# Patient Record
Sex: Female | Born: 1945 | Race: White | Hispanic: No | Marital: Married | Smoking: Never smoker
Health system: Southern US, Community
[De-identification: ages and names within clinical notes are randomized; demographics above are authoritative.]

## PROBLEM LIST (undated history)

## (undated) DIAGNOSIS — E876 Hypokalemia: Secondary | ICD-10-CM

## (undated) DIAGNOSIS — I639 Cerebral infarction, unspecified: Secondary | ICD-10-CM

## (undated) DIAGNOSIS — Z7901 Long term (current) use of anticoagulants: Secondary | ICD-10-CM

## (undated) DIAGNOSIS — R41840 Attention and concentration deficit: Secondary | ICD-10-CM

## (undated) DIAGNOSIS — I499 Cardiac arrhythmia, unspecified: Secondary | ICD-10-CM

## (undated) DIAGNOSIS — M1990 Unspecified osteoarthritis, unspecified site: Secondary | ICD-10-CM

## (undated) DIAGNOSIS — F32A Depression, unspecified: Secondary | ICD-10-CM

## (undated) DIAGNOSIS — F909 Attention-deficit hyperactivity disorder, unspecified type: Secondary | ICD-10-CM

## (undated) DIAGNOSIS — J189 Pneumonia, unspecified organism: Secondary | ICD-10-CM

## (undated) DIAGNOSIS — E785 Hyperlipidemia, unspecified: Secondary | ICD-10-CM

## (undated) DIAGNOSIS — R002 Palpitations: Secondary | ICD-10-CM

## (undated) DIAGNOSIS — I4819 Other persistent atrial fibrillation: Secondary | ICD-10-CM

## (undated) DIAGNOSIS — F419 Anxiety disorder, unspecified: Secondary | ICD-10-CM

## (undated) DIAGNOSIS — I10 Essential (primary) hypertension: Secondary | ICD-10-CM

## (undated) DIAGNOSIS — G459 Transient cerebral ischemic attack, unspecified: Secondary | ICD-10-CM

## (undated) DIAGNOSIS — F329 Major depressive disorder, single episode, unspecified: Secondary | ICD-10-CM

## (undated) HISTORY — DX: Palpitations: R00.2

## (undated) HISTORY — DX: Morbid (severe) obesity due to excess calories: E66.01

## (undated) HISTORY — DX: Attention and concentration deficit: R41.840

## (undated) HISTORY — DX: Depression, unspecified: F32.A

## (undated) HISTORY — DX: Hyperlipidemia, unspecified: E78.5

## (undated) HISTORY — DX: Major depressive disorder, single episode, unspecified: F32.9

## (undated) HISTORY — DX: Long term (current) use of anticoagulants: Z79.01

## (undated) HISTORY — PX: TONSILLECTOMY: SHX5217

## (undated) HISTORY — PX: BREAST BIOPSY: SHX20

## (undated) HISTORY — PX: TONSILLECTOMY: SUR1361

## (undated) HISTORY — DX: Other persistent atrial fibrillation: I48.19

## (undated) HISTORY — DX: Hypokalemia: E87.6

## (undated) HISTORY — DX: Essential (primary) hypertension: I10

---

## 2003-02-22 ENCOUNTER — Inpatient Hospital Stay (HOSPITAL_COMMUNITY): Admission: EM | Admit: 2003-02-22 | Discharge: 2003-02-24 | Payer: Self-pay | Admitting: Emergency Medicine

## 2003-02-23 ENCOUNTER — Encounter: Payer: Self-pay | Admitting: Cardiology

## 2004-12-05 ENCOUNTER — Encounter: Admission: RE | Admit: 2004-12-05 | Discharge: 2004-12-05 | Payer: Self-pay | Admitting: Cardiology

## 2004-12-22 ENCOUNTER — Ambulatory Visit (HOSPITAL_COMMUNITY): Admission: RE | Admit: 2004-12-22 | Discharge: 2004-12-22 | Payer: Self-pay | Admitting: Cardiology

## 2006-01-15 ENCOUNTER — Encounter: Admission: RE | Admit: 2006-01-15 | Discharge: 2006-04-15 | Payer: Self-pay | Admitting: Cardiology

## 2009-04-13 ENCOUNTER — Inpatient Hospital Stay (HOSPITAL_COMMUNITY): Admission: EM | Admit: 2009-04-13 | Discharge: 2009-04-17 | Payer: Self-pay

## 2009-04-13 ENCOUNTER — Ambulatory Visit: Payer: Self-pay | Admitting: Internal Medicine

## 2009-04-13 ENCOUNTER — Ambulatory Visit: Payer: Self-pay | Admitting: Diagnostic Radiology

## 2009-11-16 ENCOUNTER — Ambulatory Visit: Payer: Self-pay | Admitting: Cardiology

## 2009-12-20 ENCOUNTER — Ambulatory Visit: Payer: Self-pay | Admitting: Cardiology

## 2010-01-17 ENCOUNTER — Ambulatory Visit: Payer: Self-pay | Admitting: Cardiology

## 2010-02-16 ENCOUNTER — Ambulatory Visit: Payer: Self-pay | Admitting: Cardiology

## 2010-05-19 ENCOUNTER — Ambulatory Visit (INDEPENDENT_AMBULATORY_CARE_PROVIDER_SITE_OTHER): Payer: 59 | Admitting: Cardiology

## 2010-05-19 DIAGNOSIS — I4891 Unspecified atrial fibrillation: Secondary | ICD-10-CM

## 2010-05-19 DIAGNOSIS — Z7901 Long term (current) use of anticoagulants: Secondary | ICD-10-CM

## 2010-05-19 DIAGNOSIS — Z79899 Other long term (current) drug therapy: Secondary | ICD-10-CM

## 2010-06-16 ENCOUNTER — Other Ambulatory Visit (INDEPENDENT_AMBULATORY_CARE_PROVIDER_SITE_OTHER): Payer: 59

## 2010-06-16 DIAGNOSIS — I4891 Unspecified atrial fibrillation: Secondary | ICD-10-CM

## 2010-06-16 DIAGNOSIS — Z7901 Long term (current) use of anticoagulants: Secondary | ICD-10-CM

## 2010-06-18 LAB — POCT I-STAT 3, ART BLOOD GAS (G3+)
Acid-Base Excess: 2 mmol/L (ref 0.0–2.0)
Bicarbonate: 25.7 mEq/L — ABNORMAL HIGH (ref 20.0–24.0)
O2 Saturation: 100 %
Patient temperature: 99.8
TCO2: 27 mmol/L (ref 0–100)
pCO2 arterial: 38.3 mmHg (ref 35.0–45.0)
pH, Arterial: 7.437 — ABNORMAL HIGH (ref 7.350–7.400)
pO2, Arterial: 170 mmHg — ABNORMAL HIGH (ref 80.0–100.0)

## 2010-06-18 LAB — BASIC METABOLIC PANEL
BUN: 11 mg/dL (ref 6–23)
BUN: 11 mg/dL (ref 6–23)
BUN: 13 mg/dL (ref 6–23)
BUN: 17 mg/dL (ref 6–23)
CO2: 24 mEq/L (ref 19–32)
CO2: 25 mEq/L (ref 19–32)
CO2: 28 mEq/L (ref 19–32)
CO2: 28 mEq/L (ref 19–32)
Calcium: 8.4 mg/dL (ref 8.4–10.5)
Calcium: 8.5 mg/dL (ref 8.4–10.5)
Calcium: 8.5 mg/dL (ref 8.4–10.5)
Calcium: 9.1 mg/dL (ref 8.4–10.5)
Chloride: 107 mEq/L (ref 96–112)
Chloride: 107 mEq/L (ref 96–112)
Chloride: 110 mEq/L (ref 96–112)
Chloride: 104 mEq/L (ref 96–112)
Chloride: 107 mEq/L (ref 96–112)
Creatinine, Ser: 0.93 mg/dL (ref 0.4–1.2)
Creatinine, Ser: 1.02 mg/dL (ref 0.4–1.2)
Creatinine, Ser: 1.09 mg/dL (ref 0.4–1.2)
Creatinine, Ser: 0.9 mg/dL (ref 0.4–1.2)
GFR calc Af Amer: >60 mL/min (ref >60)
GFR calc Af Amer: >60 mL/min (ref >60)
GFR calc Af Amer: >60 mL/min (ref >60)
GFR calc Af Amer: >60 mL/min (ref >60)
GFR calc Af Amer: >60 mL/min (ref >60)
GFR calc non Af Amer: 51 mL/min — ABNORMAL LOW (ref >60)
GFR calc non Af Amer: 55 mL/min — ABNORMAL LOW (ref >60)
GFR calc non Af Amer: >60 mL/min (ref >60)
GFR calc non Af Amer: >60 mL/min (ref >60)
GFR calc non Af Amer: 58 mL/min — ABNORMAL LOW (ref >60)
Glucose, Bld: 117 mg/dL — ABNORMAL HIGH (ref 70–99)
Glucose, Bld: 118 mg/dL — ABNORMAL HIGH (ref 70–99)
Glucose, Bld: 150 mg/dL — ABNORMAL HIGH (ref 70–99)
Glucose, Bld: 116 mg/dL — ABNORMAL HIGH (ref 70–99)
Potassium: 3.2 mEq/L — ABNORMAL LOW (ref 3.5–5.1)
Potassium: 3.4 mEq/L — ABNORMAL LOW (ref 3.5–5.1)
Potassium: 4.5 mEq/L (ref 3.5–5.1)
Potassium: 3.9 mEq/L (ref 3.5–5.1)
Potassium: 3.4 mEq/L — ABNORMAL LOW (ref 3.5–5.1)
Sodium: 141 mEq/L (ref 135–145)
Sodium: 142 mEq/L (ref 135–145)
Sodium: 143 mEq/L (ref 135–145)
Sodium: 143 mEq/L (ref 135–145)

## 2010-06-18 LAB — GLUCOSE, CAPILLARY
Glucose-Capillary: 78 mg/dL (ref 70–99)
Glucose-Capillary: 84 mg/dL (ref 70–99)

## 2010-06-18 LAB — CBC
HCT: 36.4 % (ref 36.0–46.0)
HCT: 37 % (ref 36.0–46.0)
HCT: 38.4 % (ref 36.0–46.0)
HCT: 45.5 % (ref 36.0–46.0)
Hemoglobin: 12.5 g/dL (ref 12.0–15.0)
Hemoglobin: 12.8 g/dL (ref 12.0–15.0)
Hemoglobin: 13 g/dL (ref 12.0–15.0)
Hemoglobin: 15.4 g/dL — ABNORMAL HIGH (ref 12.0–15.0)
MCHC: 33.9 g/dL (ref 30.0–36.0)
MCHC: 34.4 g/dL (ref 30.0–36.0)
MCHC: 34.5 g/dL (ref 30.0–36.0)
MCHC: 33.9 g/dL (ref 30.0–36.0)
MCV: 90.3 fL (ref 78.0–100.0)
MCV: 90.3 fL (ref 78.0–100.0)
MCV: 90.5 fL (ref 78.0–100.0)
MCV: 89.5 fL (ref 78.0–100.0)
Platelets: 256 10*3/uL (ref 150–400)
Platelets: 272 10*3/uL (ref 150–400)
Platelets: 281 10*3/uL (ref 150–400)
Platelets: 279 10*3/uL (ref 150–400)
RBC: 4.03 MIL/uL (ref 3.87–5.11)
RBC: 4.09 MIL/uL (ref 3.87–5.11)
RBC: 4.25 MIL/uL (ref 3.87–5.11)
RBC: 5.09 MIL/uL (ref 3.87–5.11)
RDW: 13.1 % (ref 11.5–15.5)
RDW: 13.2 % (ref 11.5–15.5)
RDW: 13.3 % (ref 11.5–15.5)
RDW: 12.4 % (ref 11.5–15.5)
WBC: 12.1 10*3/uL — ABNORMAL HIGH (ref 4.0–10.5)
WBC: 9.1 10*3/uL (ref 4.0–10.5)
WBC: 9.8 10*3/uL (ref 4.0–10.5)
WBC: 17.1 10*3/uL — ABNORMAL HIGH (ref 4.0–10.5)

## 2010-06-18 LAB — PREPARE FRESH FROZEN PLASMA

## 2010-06-18 LAB — CULTURE, BLOOD (ROUTINE X 2)
Culture: NO GROWTH
Culture: NO GROWTH

## 2010-06-18 LAB — CROSSMATCH
ABO/RH(D): O POS
Antibody Screen: NEGATIVE

## 2010-06-18 LAB — PROTIME-INR
INR: 1.96 — ABNORMAL HIGH (ref 0.00–1.49)
INR: 2.3 — ABNORMAL HIGH (ref 0.00–1.49)
INR: 2.33 — ABNORMAL HIGH (ref 0.00–1.49)
INR: 2.33 — ABNORMAL HIGH (ref 0.00–1.49)
Prothrombin Time: 22.2 seconds — ABNORMAL HIGH (ref 11.6–15.2)
Prothrombin Time: 25.1 seconds — ABNORMAL HIGH (ref 11.6–15.2)
Prothrombin Time: 25.4 seconds — ABNORMAL HIGH (ref 11.6–15.2)
Prothrombin Time: 25.4 seconds — ABNORMAL HIGH (ref 11.6–15.2)

## 2010-06-18 LAB — ABO/RH: ABO/RH(D): O POS

## 2010-06-18 LAB — CULTURE, ROUTINE-ABSCESS: Culture: NORMAL

## 2010-06-18 LAB — MAGNESIUM: Magnesium: 2.5 mg/dL (ref 1.5–2.5)

## 2010-06-18 LAB — ANAEROBIC CULTURE

## 2010-06-18 LAB — MRSA PCR SCREENING: MRSA by PCR: NEGATIVE

## 2010-07-21 ENCOUNTER — Other Ambulatory Visit (INDEPENDENT_AMBULATORY_CARE_PROVIDER_SITE_OTHER): Payer: 59 | Admitting: *Deleted

## 2010-07-21 ENCOUNTER — Other Ambulatory Visit: Payer: 59 | Admitting: *Deleted

## 2010-07-21 ENCOUNTER — Ambulatory Visit (INDEPENDENT_AMBULATORY_CARE_PROVIDER_SITE_OTHER): Payer: 59 | Admitting: Cardiology

## 2010-07-21 DIAGNOSIS — Z7901 Long term (current) use of anticoagulants: Secondary | ICD-10-CM

## 2010-07-21 DIAGNOSIS — I4891 Unspecified atrial fibrillation: Secondary | ICD-10-CM | POA: Insufficient documentation

## 2010-08-16 ENCOUNTER — Encounter: Payer: Self-pay | Admitting: *Deleted

## 2010-08-16 DIAGNOSIS — R002 Palpitations: Secondary | ICD-10-CM

## 2010-08-16 DIAGNOSIS — E785 Hyperlipidemia, unspecified: Secondary | ICD-10-CM

## 2010-08-16 DIAGNOSIS — I10 Essential (primary) hypertension: Secondary | ICD-10-CM

## 2010-08-16 DIAGNOSIS — I480 Paroxysmal atrial fibrillation: Secondary | ICD-10-CM

## 2010-08-17 ENCOUNTER — Encounter: Payer: 59 | Admitting: *Deleted

## 2010-08-17 ENCOUNTER — Telehealth: Payer: Self-pay | Admitting: Cardiology

## 2010-08-17 ENCOUNTER — Ambulatory Visit: Payer: 59 | Admitting: Cardiology

## 2010-08-17 NOTE — Telephone Encounter (Signed)
Left message ok to wait, but very important she have protime next week.  Last check on 4/20 and rescheduled for 5/21

## 2010-08-17 NOTE — Telephone Encounter (Signed)
I rescheduled Rose Ball's INR from 12:15pm 08/17/10 to 1:45pm on 08/21/10 today. Please call back.

## 2010-08-18 NOTE — Discharge Summary (Signed)
NAME:  Rose Ball, Rose Ball                     ACCOUNT NO.:  [id number]   MEDICAL RECORD NO.:  [id number]                   PATIENT TYPE:  INP   LOCATION:  2038                                 FACILITY:  [facility]   PHYSICIAN:  Rose Ball, M.D.            DATE OF BIRTH:  04-29-1945   DATE OF ADMISSION:  02/22/2003  DATE OF DISCHARGE:  02/24/2003                                 DISCHARGE SUMMARY   PRIMARY DISCHARGE DIAGNOSIS:  Onset of atrial fibrillation, now resolved.   SECONDARY DISCHARGE DIAGNOSES:  1. Morbid obesity.  2. Hyperlipidemia.   HISTORY OF PRESENT ILLNESS:  The patient is a morbidly obese 65 year old  female.  She presents to the emergency room department with atrial  fibrillation.  She has not seen a physician in quite some time.  Over the  last four to five days, she had a head cold and was using some over-the-  counter NyQuil.  On the day of admission, she began having some right-sided  chest discomfort as well as noticeable feeling of irregularity.  She  presented to the emergency room where she was noted to be going back and  forth into atrial fibrillation.  She initially had an uncontrolled  ventricular response.  She was subsequently seen and evaluated by Dr.  Viann Ball and was admitted for further evaluation.   Please see the dictated History and Physical for further patient  presentation and profile.   ADMISSION LABORATORY DATA:  1. EKG showed atrial fibrillation with an uncontrolled response.  There were     very minimal nonspecific ST changes.  There were no acute changes.  2. CBC was normal.  Chemistry panel was  within normal limits except for a     glucose of 114, alkaline phosphatase 127.  PT and PTT were unremarkable.  3. Lipid profile showed total cholesterol 238, triglycerides 153, HDL 42,     and LDL 165.  4. TSH was normal.  5. Cardiac enzymes were all negative.  6. Chest x-ray report is pending at time of dictation.   HOSPITAL  COURSE:  The patient was admitted to telemetry.  Serial cardiac  enzymes were drawn.  She was placed on Lovenox, beta blockers, as well as  initial IV Cardizem for rate control.  A 2-D echocardiogram was performed.  It was a very technically difficult study.  Overall LV function was felt to  be normal.  There was some mild LV thickness.  She subsequently converted  back to normal sinus rhythm.  Today, on February 24, 2003, she is doing well  without complaints.  She has had no further chest pain.  She did have a  transient elevation in her white count which is now back down to normal, and  she is felt to be a stable candidate for discharge today.   CONDITION ON DISCHARGE:  Stable.   DISCHARGE MEDICATIONS:  1. Toprol XL 200 mg a day.  2. Aspirin  daily.  3. She may resume  Celexa as she was taking before.  4. She is to stop Adderall.   ACTIVITY:  As tolerated.   DIET:  She is to limit her caffeine intake.   SPECIAL INSTRUCTIONS:  She is advised to not use over-the-counter cold  medicines.   FOLLOW UP:  Will have her follow up with Rose Ball in the office in  approximately two weeks, sooner if problems arise.      Rose Ball, N.P.                 Rose Ball, M.D.    LCO/MEDQ  D:  02/24/2003  T:  02/24/2003  Job:  [id number]   cc:   Rose Ball, M.D.  [address], [address]  [place]  [place] [address]  Fax: [phone]

## 2010-08-18 NOTE — H&P (Signed)
NAME:  Rose Ball, Rose Ball                     ACCOUNT NO.:  [id number]   MEDICAL RECORD NO.:  [id number]                   PATIENT TYPE:  EMS   LOCATION:  MINO                                 FACILITY:  [facility]   PHYSICIAN:  W. Ashley Royalty., M.D.         DATE OF BIRTH:  1945-07-14   DATE OF ADMISSION:  02/22/2003  DATE OF DISCHARGE:                                HISTORY & PHYSICAL   HISTORY:  This 65 year old female was admitted to rule out a myocardial  infarction with atrial fibrillation. She has a history of cardiac arrhythmia  and was evidently evaluated by Dr. Patty Sermons with an echocardiogram several  years ago. She has no regular medical physician and has not seen a physician  in some time. She has had an upper respiratory infection and head cold for  the past five days and has been taking NyQuil and is slowly improving. She  had the onset of right-sided chest discomfort this morning that was  described as a vague ache, which would be worse if she bent over and caused  tightness in her shoulder. She had some irregularities in her chest also and  presented to the emergency room when the chest discomfort persisted after  lasting all day and she was found to be in atrial fibrillation. She was  initially in sinus, but then has gone back and forth into atrial  fibrillation. She is currently in atrial fibrillation with an uncontrolled  response. She did not have chest discomfort prior to today. She denies any  PND or orthopnea. She has been quite obese for many years and is normally  inactive. She has not had significant edema. She has not had syncope and  does not have exertional pain as noted above.   PAST MEDICAL HISTORY:  Negative for hypertension or diabetes. Her  cholesterol status is unknown. There is no history of ulcers of GI bleeding.   PAST SURGICAL HISTORY:  Breast biopsy.   ALLERGIES:  No known drug allergies.   CURRENT MEDICATIONS:  1. Celexa 30 mg daily.  2.  Adderall for depression.   FAMILY HISTORY:  Father is living age 64, alive and well. Mother is living  age 64 with a history of uterine cancer and diabetes. Two brothers are  living and well.   SOCIAL HISTORY:  She is married. She has two children ages 64 and 64. Works  as a Theatre manager for [employer]. Her husband works as a Medical illustrator  for a [employer]. She is a nonsmoker. She does not use alcohol  to excess. She does drink excess caffeine in the form of approximately three  colas per day.   REVIEW OF SYSTEMS:  She has been obese weighing in the neighborhood of 275-  280 pounds for years. She has no diplopia, glaucoma, cataracts, or blurred  vision. She has complained of a ringing in her ears some. She has no  dysphagia, hiatal hernia, diarrhea, or  constipation. She has no  genitourinary symptoms. She has not had any significant GI bleeding.  Significant for arthritis. No prior history of migraines or TIAs. She does  have significant depression for many years that she is seeing Dr. Andee Poles for. She has never been hospitalized for this. Other than as noted  above the remainder of the review of systems is unremarkable.   PHYSICAL EXAMINATION:  GENERAL:  She is a markedly obese female who is  pleasant and currently in no acute distress.  VITAL SIGNS:  Blood pressure 150/80, pulse 150 and irregular.  SKIN:  Warm and dry and is not diaphoretic.  HEENT:  EOMI. PERRL. CNS clear. Fundi not examined. Pharynx negative.  NECK:  Supple without masses. No JVD, thyromegaly, or bruits.  LUNGS:  Clear to auscultation and percussion.  CARDIAC:  Normal S1 and S2. Regular rhythm. No S3, no murmur.  ABDOMEN:  Soft, nontender. No hepatosplenomegaly. No definite masses felt,  although very obese and difficult to feel.  EXTREMITIES:  Lower pulses are difficult to feel, but peripheral pulses are  2+. There is no significant edema noted. No varicosities or Homan's sign is   noted.  NEUROLOGIC:  Normal.   LABORATORY DATA:  EKG shows atrial fibrillation with an uncontrolled  response. There is very minimal nonspecific ST changes, but no acute  abnormality is noted. The initial CBC is normal. Chemistry panel, PT/PTT are  pending at the time of dictation.   IMPRESSION:  1. Atrial fibrillation, new onset.  2. Chest pain with atypical features, possibly due to atrial fibrillation,     rule out myocardial infarction.  3. Morbid obesity.  4. History of depression.   RECOMMENDATIONS:  1. Admit to telemetry bed.  2. Check serial enzymes.  3. Begin Lovenox, beta blockers, intravenous Cardizem for rate control.  4. Check echocardiogram and TSH.  5. Further workup and treatment per Dr. Patty Sermons or his associates.                                                Darden Palmer., M.D.    WST/MEDQ  D:  02/22/2003  T:  02/22/2003  Job:  [id number]   cc:   Cassell Clement, M.D.  [address]  [place]  [place] [address]  Fax: [phone]

## 2010-08-21 ENCOUNTER — Ambulatory Visit (INDEPENDENT_AMBULATORY_CARE_PROVIDER_SITE_OTHER): Payer: 59 | Admitting: *Deleted

## 2010-08-21 DIAGNOSIS — Z7901 Long term (current) use of anticoagulants: Secondary | ICD-10-CM

## 2010-08-21 DIAGNOSIS — I4891 Unspecified atrial fibrillation: Secondary | ICD-10-CM

## 2010-08-21 LAB — POCT INR: INR: 2.6

## 2010-08-30 ENCOUNTER — Encounter: Payer: Self-pay | Admitting: Cardiology

## 2010-08-30 ENCOUNTER — Ambulatory Visit (INDEPENDENT_AMBULATORY_CARE_PROVIDER_SITE_OTHER): Payer: 59 | Admitting: Cardiology

## 2010-08-30 VITALS — BP 120/80 | HR 84 | Wt 285.0 lb

## 2010-08-30 DIAGNOSIS — I4891 Unspecified atrial fibrillation: Secondary | ICD-10-CM

## 2010-08-30 DIAGNOSIS — F909 Attention-deficit hyperactivity disorder, unspecified type: Secondary | ICD-10-CM | POA: Insufficient documentation

## 2010-08-30 DIAGNOSIS — I10 Essential (primary) hypertension: Secondary | ICD-10-CM

## 2010-08-30 NOTE — Progress Notes (Signed)
Caryn Bee Date of Birth:  05/20/1945 [facility] / [facility] [address]   [address] [place], [place]  [address] [phone]           Fax   [phone]  HPI: This pleasant 65 year old woman has a history of paroxysmal atrial fibrillation.  Recently she has been in atrial fibrillation more often than in sinus rhythm clinically she has not had any TIA symptoms.  She has been compliant with her Coumadin.  She's not having any complications from the Coumadin.  She denies chest pain or dizzy spells.  She does have some exertional dyspnea related to her excess weight.  She does not have known coronary disease and she had a normal nuclear stress test 11/27/04.  Her last echocardiogram was 12/13/04 and showed normal left ventricular systolic function and mild restrictive diastolic function, mild left atrial enlargement, and trace Mitral regurgitation.  Current Outpatient Prescriptions  Medication Sig Dispense Refill  . atorvastatin (LIPITOR) 10 MG tablet Take 1 tablet by mouth Daily.        Marland Kitchen escitalopram (LEXAPRO) 20 MG tablet Take 1 tablet by mouth Daily. Lexapro      . metoprolol (TOPROL-XL) 100 MG 24 hr tablet Take 1 tablet by mouth Daily.      . nitroGLYCERIN (NITROSTAT) 0.4 MG SL tablet Place 0.4 mg under the tongue every 5 (five) minutes as needed.        Marland Kitchen RYTHMOL SR 325 MG 12 hr capsule Take 1 capsule by mouth Twice daily.      Marland Kitchen VYVANSE 70 MG capsule Take 1 capsule by mouth Daily.      Marland Kitchen warfarin (COUMADIN) 5 MG tablet Take 5 mg by mouth daily.        Marland Kitchen DISCONTD: amphetamine-dextroamphetamine (ADDERALL) 5 MG tablet Take 1 tablet by mouth Daily.      Marland Kitchen DISCONTD: rOPINIRole (REQUIP) 1 MG tablet Take 1 tablet by mouth Daily.        Allergies  Allergen Reactions  . Amiodarone     dyspnea    Patient Active Problem List  Diagnoses  . Atrial fibrillation  . Encounter for long-term (current) use of anticoagulants  . Hypertension  . Paroxysmal atrial  fibrillation  . Dyslipidemia  . Essential hypertension  . Palpitations  . Attention deficit disorder with hyperactivity syndrome    History  Smoking status  . Never Smoker   Smokeless tobacco  . Not on file    History  Alcohol Use: Not on file    Family History  Problem Relation Age of Onset  . Coronary artery disease Father     Review of Systems: The patient denies any heat or cold intolerance.  No weight gain or weight loss.  The patient denies headaches or blurry vision.  There is no cough or sputum production.  The patient denies dizziness.  There is no hematuria or hematochezia.  The patient denies any muscle aches or arthritis.  The patient denies any rash.  The patient denies frequent falling or instability.  There is no history of depression or anxiety.  All other systems were reviewed and are negative.   Physical Exam: Filed Vitals:   08/30/10 1421  BP: 120/80  Pulse: 84  The general appearance reveals a large woman in no acute distress.Pupils equal and reactive.   Extraocular Movements are full.  There is no scleral icterus.  The mouth and pharynx are normal.  The neck is supple.  The carotids reveal no bruits.  The jugular venous pressure is normal.  The thyroid is not enlarged.  There is no lymphadenopathy.The chest is clear to percussion and auscultation. There are no rales or rhonchi. Expansion of the chest is symmetrical.The precordium is quiet.  The first heart sound is normal.  The second heart sound is physiologically split.  There is no murmur gallop rub or click.  There is no abnormal lift or heave.  The rhythm is irregular.The abdomen is soft and nontender. Bowel sounds are normal. The liver and spleen are not enlarged. There Are no abdominal masses. There are no bruits.  Normal extremity without phlebitis or edema.  Pedal pulses are present.Strength is normal and symmetrical in all extremities.  There is no lateralizing weakness.  There are no sensory  deficits.  EKG today shows atrial fibrillation with a controlled ventricular response.  She has poor R-wave progression V1 through V3.    Assessment / Plan: Continue present medication.  Recheck at monthly intervals for protime and we will see her in 3 months.  Continue to try to lose weight and increase exercise.

## 2010-08-30 NOTE — Assessment & Plan Note (Signed)
The patient has a history of paroxysmal atrial fibrillation.  Frequently she is not aware when she goes from normal sinus rhythm to atrial fibrillation.  She remains on long-term Coumadin.  She has exertional dyspnea related to exogenous obesity.  She does not get much intentional aerobic exercise.  She's not been expressing any chest pain or shortness of breath.  Also since last visit her weight is down 4 pounds.She has not had any TIA symptoms.  She's not having any dizziness or syncope.

## 2010-08-30 NOTE — Assessment & Plan Note (Signed)
The patient has a history of essential hypertension.  She's not having any headaches or dizzy spells.  She remains on beta blocker which she is tolerating well.

## 2010-09-11 ENCOUNTER — Ambulatory Visit (INDEPENDENT_AMBULATORY_CARE_PROVIDER_SITE_OTHER): Payer: 59 | Admitting: *Deleted

## 2010-09-11 DIAGNOSIS — I4891 Unspecified atrial fibrillation: Secondary | ICD-10-CM

## 2010-09-11 LAB — POCT INR: INR: 2.3

## 2010-10-09 ENCOUNTER — Ambulatory Visit (INDEPENDENT_AMBULATORY_CARE_PROVIDER_SITE_OTHER): Payer: 59 | Admitting: *Deleted

## 2010-10-09 DIAGNOSIS — I4891 Unspecified atrial fibrillation: Secondary | ICD-10-CM

## 2010-10-09 DIAGNOSIS — Z7901 Long term (current) use of anticoagulants: Secondary | ICD-10-CM

## 2010-11-01 HISTORY — PX: US ECHOCARDIOGRAPHY: HXRAD669

## 2010-11-06 ENCOUNTER — Encounter: Payer: 59 | Admitting: *Deleted

## 2010-11-06 ENCOUNTER — Telehealth: Payer: Self-pay | Admitting: Cardiology

## 2010-11-06 DIAGNOSIS — I4891 Unspecified atrial fibrillation: Secondary | ICD-10-CM

## 2010-11-06 MED ORDER — WARFARIN SODIUM 5 MG PO TABS: ORAL_TABLET | ORAL | Status: DC

## 2010-11-06 NOTE — Telephone Encounter (Signed)
Patient called in wanting to reschedule her missed INR at [facility] today and, when asked for her date of birth, she only provided the month and date. When asked to elaborate she claimed that we had her in the system. When told that more information was needed to accurately find her in our database and that I could not find her on our schedule for today, she said never mind and rudely hung up.

## 2010-11-06 NOTE — Telephone Encounter (Signed)
Called patient back and left message to call and reschedule INR

## 2010-11-09 ENCOUNTER — Ambulatory Visit (INDEPENDENT_AMBULATORY_CARE_PROVIDER_SITE_OTHER): Payer: 59 | Admitting: *Deleted

## 2010-11-09 DIAGNOSIS — I4891 Unspecified atrial fibrillation: Secondary | ICD-10-CM

## 2010-11-09 DIAGNOSIS — Z7901 Long term (current) use of anticoagulants: Secondary | ICD-10-CM

## 2010-11-13 ENCOUNTER — Encounter: Payer: Self-pay | Admitting: Cardiology

## 2010-11-15 ENCOUNTER — Encounter: Payer: Self-pay | Admitting: Nurse Practitioner

## 2010-11-15 ENCOUNTER — Ambulatory Visit (INDEPENDENT_AMBULATORY_CARE_PROVIDER_SITE_OTHER): Payer: 59 | Admitting: Nurse Practitioner

## 2010-11-15 VITALS — BP 158/108 | HR 86 | Ht 69.0 in | Wt 278.2 lb

## 2010-11-15 DIAGNOSIS — I4891 Unspecified atrial fibrillation: Secondary | ICD-10-CM

## 2010-11-15 DIAGNOSIS — I10 Essential (primary) hypertension: Secondary | ICD-10-CM

## 2010-11-15 MED ORDER — PROPAFENONE HCL ER 425 MG PO CP12: 425.0000 mg | ORAL_CAPSULE | Freq: Two times a day (BID) | ORAL | Status: DC

## 2010-11-15 NOTE — Progress Notes (Signed)
    Rose Ball Date of Birth: 1945-06-21   History of Present Illness: Rose Ball is seen today for a work in visit. She is seen for Dr. Patty Sermons. She is here because of atrial fib. She has been out of rhythm for almost 2 months. She feels tired and is more short of breath. She thought she should be seen. No chest pain. She remains on her coumadin but last check was a little low due to a missed dose. She has had remote echo in 2006. No recent labs. She has never had cardioversion.  Current Outpatient Prescriptions on File Prior to Visit  Medication Sig Dispense Refill  . atorvastatin (LIPITOR) 10 MG tablet Take 1 tablet by mouth Daily.        . Cholecalciferol (VITAMIN D PO) Take 5,000 Units by mouth daily.        Marland Kitchen escitalopram (LEXAPRO) 20 MG tablet Take 1 tablet by mouth Daily. Lexapro      . metoprolol (TOPROL-XL) 100 MG 24 hr tablet Take 1 tablet by mouth Daily.      . nitroGLYCERIN (NITROSTAT) 0.4 MG SL tablet Place 0.4 mg under the tongue every 5 (five) minutes as needed.        Marland Kitchen VYVANSE 70 MG capsule Take 1 capsule by mouth Daily.      Marland Kitchen warfarin (COUMADIN) 5 MG tablet 1/2 tablet Mon, Wed, and Fri and 1 tablet other days or as directed  30 tablet  11  . DISCONTD: RYTHMOL SR 325 MG 12 hr capsule Take 1 capsule by mouth Twice daily.        Allergies  Allergen Reactions  . Amiodarone     dyspnea    Past Medical History  Diagnosis Date  . Hypertension   . Paroxysmal atrial fibrillation   . Morbid obesity   . Attention deficit     on Vyvanse  . Dyslipidemia   . Essential hypertension   . Palpitations   . Hypokalemia   . Chronic anticoagulation   . Depression   . High risk medication use     Past Surgical History  Procedure Date  . Breast biopsy   . Tonsillectomy     History  Smoking status  . Never Smoker   Smokeless tobacco  . Not on file    History  Alcohol Use No    Family History  Problem Relation Age of Onset  . Coronary artery disease  Father     Review of Systems: The review of systems is positive for fatigue and shortness of breath. She is not dizzy. No syncope. No edema.  She does remain on her stimulant therapy due to her ADD. All other systems were reviewed and are negative.  Physical Exam: BP 158/108  Pulse 86  Ht 5\' 9"  (1.753 m)  Wt 278 lb 3.2 oz (126.191 kg)  BMI 41.08 kg/m2 Patient is very pleasant and in no acute distress. She is morbidly obese. Skin is warm and dry. Color is normal.  HEENT is unremarkable. Normocephalic/atraumatic. PERRL. Sclera are nonicteric. Neck is supple. No masses. No JVD. Lungs are clear. Cardiac exam shows an irregular rhythm. Her rate is controlled.  Abdomen is obese and soft. Extremities are without edema. Gait and ROM are intact. No gross neurologic deficits noted.  LABORATORY DATA: EKG shows atrial fib. Her rate is controlled.   Assessment / Plan:

## 2010-11-15 NOTE — Assessment & Plan Note (Signed)
She has been out of rhythm now for almost 2 months. She does not feel good with it. We will update her echo and check labs today. I have increased her Rhythmol to 425 mg BID. She has an appointment to see Dr. Patty Sermons back later this month. If she does not convert we would need to consider cardioversion. She is not interested in this being a permanent rhythm for her. Patient is agreeable to this plan and will call if any problems develop in the interim.

## 2010-11-15 NOTE — Patient Instructions (Signed)
We are going to increase your Rythmol to 425mg  twice a day I have sent the prescription to the drug store. We are going to check your labs today We are going to update your ultrasound of your heart Keep your appointment with Dr. Patty Sermons for later this month. We may need to consider a cardioversion if you have not gone back in sinus rhythm at that visit. Call for any problems.

## 2010-11-15 NOTE — Assessment & Plan Note (Signed)
Blood pressure was up today and will need to be readdressed on return visit.

## 2010-11-16 ENCOUNTER — Ambulatory Visit (HOSPITAL_COMMUNITY): Payer: 59 | Attending: Nurse Practitioner | Admitting: Radiology

## 2010-11-16 ENCOUNTER — Other Ambulatory Visit (HOSPITAL_COMMUNITY): Payer: Self-pay | Admitting: Cardiology

## 2010-11-16 ENCOUNTER — Encounter: Payer: Self-pay | Admitting: Nurse Practitioner

## 2010-11-16 DIAGNOSIS — I4891 Unspecified atrial fibrillation: Secondary | ICD-10-CM

## 2010-11-16 DIAGNOSIS — R5381 Other malaise: Secondary | ICD-10-CM | POA: Insufficient documentation

## 2010-11-16 DIAGNOSIS — E785 Hyperlipidemia, unspecified: Secondary | ICD-10-CM | POA: Insufficient documentation

## 2010-11-16 DIAGNOSIS — R0609 Other forms of dyspnea: Secondary | ICD-10-CM | POA: Insufficient documentation

## 2010-11-16 DIAGNOSIS — I10 Essential (primary) hypertension: Secondary | ICD-10-CM | POA: Insufficient documentation

## 2010-11-16 DIAGNOSIS — R0989 Other specified symptoms and signs involving the circulatory and respiratory systems: Secondary | ICD-10-CM | POA: Insufficient documentation

## 2010-11-16 DIAGNOSIS — R5383 Other fatigue: Secondary | ICD-10-CM | POA: Insufficient documentation

## 2010-11-16 LAB — BASIC METABOLIC PANEL
BUN: 26 mg/dL — ABNORMAL HIGH (ref 6–23)
CO2: 27 mEq/L (ref 19–32)
Calcium: 9 mg/dL (ref 8.4–10.5)
Chloride: 104 mEq/L (ref 96–112)
Creatinine, Ser: 1.2 mg/dL (ref 0.4–1.2)
GFR: 50.01 mL/min — ABNORMAL LOW (ref >60.00)
Glucose, Bld: 96 mg/dL (ref 70–99)
Potassium: 5.3 mEq/L — ABNORMAL HIGH (ref 3.5–5.1)
Sodium: 139 mEq/L (ref 135–145)

## 2010-11-16 LAB — CBC WITH DIFFERENTIAL/PLATELET
Basophils Absolute: 0 10*3/uL (ref 0.0–0.1)
Basophils Relative: 0.4 % (ref 0.0–3.0)
Eosinophils Absolute: 0.1 10*3/uL (ref 0.0–0.7)
Eosinophils Relative: 1.8 % (ref 0.0–5.0)
HCT: 43.9 % (ref 36.0–46.0)
Hemoglobin: 14.7 g/dL (ref 12.0–15.0)
Lymphocytes Relative: 26.4 % (ref 12.0–46.0)
Lymphs Abs: 2.1 10*3/uL (ref 0.7–4.0)
MCHC: 33.4 g/dL (ref 30.0–36.0)
MCV: 90.3 fl (ref 78.0–100.0)
Monocytes Absolute: 0.5 10*3/uL (ref 0.1–1.0)
Monocytes Relative: 6.5 % (ref 3.0–12.0)
Neutro Abs: 5.2 10*3/uL (ref 1.4–7.7)
Neutrophils Relative %: 64.9 % (ref 43.0–77.0)
Platelets: 287 10*3/uL (ref 150.0–400.0)
RBC: 4.86 Mil/uL (ref 3.87–5.11)
RDW: 13.8 % (ref 11.5–14.6)
WBC: 8 10*3/uL (ref 4.5–10.5)

## 2010-11-16 LAB — TSH: TSH: 1.01 u[IU]/mL (ref 0.35–5.50)

## 2010-11-16 MED ORDER — PERFLUTREN PROTEIN A MICROSPH IV SUSP
2.0000 mL | Freq: Once | INTRAVENOUS | Status: AC
Start: 2010-11-16 — End: 2010-11-16
  Administered 2010-11-16: 2 mL via INTRAVENOUS

## 2010-11-17 ENCOUNTER — Telehealth: Payer: Self-pay | Admitting: *Deleted

## 2010-11-17 NOTE — Telephone Encounter (Signed)
Advised patient of labs 

## 2010-11-17 NOTE — Telephone Encounter (Signed)
Message copied by Eugenia Pancoast on Fri Nov 17, 2010  5:19 PM ------      Message from: Rosalio Macadamia      Created: Fri Nov 17, 2010  7:53 AM       Ok to report. Labs are satisfactory.

## 2010-11-20 ENCOUNTER — Telehealth: Payer: Self-pay | Admitting: *Deleted

## 2010-11-20 NOTE — Progress Notes (Signed)
Advised patient

## 2010-11-20 NOTE — Telephone Encounter (Signed)
Advised of echo results, will keep appointment next week with  Dr. Patty Sermons

## 2010-11-20 NOTE — Telephone Encounter (Signed)
Message copied by Burnell Blanks on Mon Nov 20, 2010  1:34 PM ------      Message from: Cassell Clement      Created: Sat Nov 18, 2010  8:11 PM       Please report.  2D echo is satisfactory.  EF normal.      CSD

## 2010-11-30 ENCOUNTER — Other Ambulatory Visit: Payer: Self-pay | Admitting: *Deleted

## 2010-11-30 ENCOUNTER — Ambulatory Visit (INDEPENDENT_AMBULATORY_CARE_PROVIDER_SITE_OTHER): Payer: 59 | Admitting: Cardiology

## 2010-11-30 ENCOUNTER — Ambulatory Visit (INDEPENDENT_AMBULATORY_CARE_PROVIDER_SITE_OTHER): Payer: Self-pay | Admitting: *Deleted

## 2010-11-30 ENCOUNTER — Encounter: Payer: Self-pay | Admitting: Cardiology

## 2010-11-30 VITALS — BP 130/90 | HR 84 | Wt 276.0 lb

## 2010-11-30 DIAGNOSIS — F909 Attention-deficit hyperactivity disorder, unspecified type: Secondary | ICD-10-CM

## 2010-11-30 DIAGNOSIS — I4891 Unspecified atrial fibrillation: Secondary | ICD-10-CM

## 2010-11-30 DIAGNOSIS — I119 Hypertensive heart disease without heart failure: Secondary | ICD-10-CM

## 2010-11-30 DIAGNOSIS — E7800 Pure hypercholesterolemia, unspecified: Secondary | ICD-10-CM

## 2010-11-30 MED ORDER — METOPROLOL SUCCINATE ER 100 MG PO TB24: ORAL_TABLET | ORAL | Status: DC

## 2010-11-30 NOTE — Assessment & Plan Note (Addendum)
The patient has been out of rhythm for 2-1/2 months.  She would like to proceed with cardioversion to see if we can get her back and rhythm she has not converted on her own with a higher dose of Rythmol. He had her echocardiogram on 11/16/10 which showed normal systolic function with an ejection fraction of 55-60% and a slight enlargement of her left atrium at 44 mm.

## 2010-11-30 NOTE — Progress Notes (Signed)
Rose Ball Date of Birth:  November 14, 1945 [facility] / [facility] [address]   [address] [place], [place]  [address] [phone]           Fax   [phone]  History of Present Illness: This pleasant 65 year old woman is seen for a followup office visit.  She has been having more symptoms from her atrial fibrillation.  Her atrial fibrillation used to be paroxysmal but recently it has been more established.  She was seen several weeks ago by Lawson Fiscal and her Rythmol was increased from 325 twice a day up to 425 twice a day.  The patient returns now for followup.  She has not had any thromboembolic episodes.  She's not having any chest pain.  She does have exertional dyspnea.  She remains significantly overweight.  Current Outpatient Prescriptions  Medication Sig Dispense Refill  . atorvastatin (LIPITOR) 10 MG tablet Take 1 tablet by mouth Daily.        . Cholecalciferol (VITAMIN D PO) Take 5,000 Units by mouth daily.        Marland Kitchen escitalopram (LEXAPRO) 20 MG tablet Take 1 tablet by mouth Daily. Lexapro      . metoprolol (TOPROL-XL) 100 MG 24 hr tablet Take one tab in a.m. And 1/2 tab in pm      . nitroGLYCERIN (NITROSTAT) 0.4 MG SL tablet Place 0.4 mg under the tongue every 5 (five) minutes as needed.        . propafenone (RYTHMOL SR) 425 MG 12 hr capsule Take 1 capsule (425 mg total) by mouth 2 (two) times daily.  60 capsule  6  . VYVANSE 70 MG capsule Take 1 capsule by mouth Daily.      Marland Kitchen warfarin (COUMADIN) 5 MG tablet 1/2 tablet Mon, Wed, and Fri and 1 tablet other days or as directed  30 tablet  11  . DISCONTD: metoprolol (TOPROL-XL) 100 MG 24 hr tablet Take 1 tablet by mouth Daily.        Allergies  Allergen Reactions  . Amiodarone     dyspnea    Patient Active Problem List  Diagnoses  . Atrial fibrillation  . Encounter for long-term (current) use of anticoagulants  . Hypertension  . Paroxysmal atrial fibrillation  . Dyslipidemia  . Essential hypertension  .  Palpitations  . Attention deficit disorder with hyperactivity syndrome    History  Smoking status  . Never Smoker   Smokeless tobacco  . Not on file    History  Alcohol Use No    Family History  Problem Relation Age of Onset  . Coronary artery disease Father     Review of Systems: Constitutional: no fever chills diaphoresis or fatigue or change in weight.  Head and neck: no hearing loss, no epistaxis, no photophobia or visual disturbance. Respiratory: No cough, shortness of breath or wheezing. Cardiovascular: No chest pain peripheral edema, palpitations. Gastrointestinal: No abdominal distention, no abdominal pain, no change in bowel habits hematochezia or melena. Genitourinary: No dysuria, no frequency, no urgency, no nocturia. Musculoskeletal:No arthralgias, no back pain, no gait disturbance or myalgias. Neurological: No dizziness, no headaches, no numbness, no seizures, no syncope, no weakness, no tremors. Hematologic: No lymphadenopathy, no easy bruising. Psychiatric: No confusion, no hallucinations, no sleep disturbance.    Physical Exam: Filed Vitals:   11/30/10 1339  BP: 130/90  Pulse: 84  The general appearance reveals a well-developed well-nourished large woman in no distress.Pupils equal and reactive.   Extraocular Movements are full.  There is no scleral icterus.  The mouth and pharynx are normal.  The neck is supple.  The carotids reveal no bruits.  The jugular venous pressure is normal.  The thyroid is not enlarged.  There is no lymphadenopathy.  The chest is clear to percussion and auscultation. There are no rales or rhonchi. Expansion of the chest is symmetrical.  The precordium is quiet.  The first heart sound is normal.  The second heart sound is physiologically split.  There is no murmur gallop rub or click.  There is no abnormal lift or heave.The rhythm is irregular The abdomen is soft and nontender. Bowel sounds are normal. The liver and spleen are not  enlarged. There Are no abdominal masses. There are no bruits.  The pedal pulses are good.  There is no phlebitis or edema.  There is no cyanosis or clubbing.  Strength is normal and symmetrical in all extremities.  There is no lateralizing weakness.  There are no sensory deficits.  The skin is warm and dry.  There is no rash.        Assessment / Plan: We will plan to electively cardiovert on Friday, September 21 dividing weekly prothrombin times between now and then remain therapeutic.  Continue same meds except we're increasing her metoprolol to 100 in the morning and 50 mg at supper to try to attain better rate control and this will also help her blood pressure.

## 2010-11-30 NOTE — Assessment & Plan Note (Signed)
The patient is not having any new symptoms related to her attention deficit disorder with hyperactivity syndrome.  She remains on Vyvanse as directed

## 2010-12-01 ENCOUNTER — Encounter: Payer: Self-pay | Admitting: Cardiology

## 2010-12-02 HISTORY — PX: CARDIOVERSION: SHX1299

## 2010-12-07 ENCOUNTER — Ambulatory Visit (INDEPENDENT_AMBULATORY_CARE_PROVIDER_SITE_OTHER): Payer: 59 | Admitting: *Deleted

## 2010-12-07 DIAGNOSIS — Z7901 Long term (current) use of anticoagulants: Secondary | ICD-10-CM

## 2010-12-07 DIAGNOSIS — R42 Dizziness and giddiness: Secondary | ICD-10-CM

## 2010-12-07 DIAGNOSIS — I4891 Unspecified atrial fibrillation: Secondary | ICD-10-CM

## 2010-12-08 ENCOUNTER — Encounter: Payer: 59 | Admitting: *Deleted

## 2010-12-14 ENCOUNTER — Ambulatory Visit (INDEPENDENT_AMBULATORY_CARE_PROVIDER_SITE_OTHER): Payer: 59

## 2010-12-14 ENCOUNTER — Ambulatory Visit (INDEPENDENT_AMBULATORY_CARE_PROVIDER_SITE_OTHER): Payer: 59 | Admitting: *Deleted

## 2010-12-14 ENCOUNTER — Encounter: Payer: 59 | Admitting: *Deleted

## 2010-12-14 VITALS — BP 118/82 | HR 106 | Resp 20

## 2010-12-14 DIAGNOSIS — I4891 Unspecified atrial fibrillation: Secondary | ICD-10-CM

## 2010-12-14 DIAGNOSIS — Z7901 Long term (current) use of anticoagulants: Secondary | ICD-10-CM

## 2010-12-14 LAB — POCT INR: INR: 4.5

## 2010-12-14 NOTE — Progress Notes (Signed)
Addended by: Marrion Coy L on: 12/14/2010 04:42 PM   Modules accepted: Orders

## 2010-12-14 NOTE — Progress Notes (Signed)
Pt was seen in Coumadin clinic today.  Pt was c/o "feeling worse and more tired" than last week.  Pt said she felt more short of breath with minimal exertion and "funny-headed,light-headed" upon standing up from her desk at work.  She is not short of breath at this time. Ekg performed and reviewed by DOD Dr. Clifton James  Atrial fib heart rate 106. Encouraged to stay well hydrated. Keep follow-up appt with Dr. Patty Sermons on 9/18 3:15pm. Pt aware she is scheduled  For a cardioversion Friday 9/21 12noon.  Mylo Red RN

## 2010-12-18 ENCOUNTER — Ambulatory Visit
Admission: RE | Admit: 2010-12-18 | Discharge: 2010-12-18 | Disposition: A | Discharge status: Home / Self Care | Payer: 59 | Source: Ambulatory Visit | Attending: Cardiology | Admitting: Cardiology

## 2010-12-18 ENCOUNTER — Other Ambulatory Visit: Payer: Self-pay | Admitting: *Deleted

## 2010-12-18 DIAGNOSIS — I4891 Unspecified atrial fibrillation: Secondary | ICD-10-CM

## 2010-12-19 ENCOUNTER — Ambulatory Visit (INDEPENDENT_AMBULATORY_CARE_PROVIDER_SITE_OTHER): Payer: 59 | Admitting: Cardiology

## 2010-12-19 ENCOUNTER — Encounter: Payer: Self-pay | Admitting: Cardiology

## 2010-12-19 ENCOUNTER — Ambulatory Visit (INDEPENDENT_AMBULATORY_CARE_PROVIDER_SITE_OTHER): Payer: 59 | Admitting: *Deleted

## 2010-12-19 VITALS — BP 118/70 | HR 98 | Ht 69.0 in | Wt 276.0 lb

## 2010-12-19 DIAGNOSIS — I4891 Unspecified atrial fibrillation: Secondary | ICD-10-CM

## 2010-12-19 DIAGNOSIS — I10 Essential (primary) hypertension: Secondary | ICD-10-CM

## 2010-12-19 DIAGNOSIS — F909 Attention-deficit hyperactivity disorder, unspecified type: Secondary | ICD-10-CM

## 2010-12-19 DIAGNOSIS — Z7901 Long term (current) use of anticoagulants: Secondary | ICD-10-CM

## 2010-12-19 LAB — POCT INR: INR: 2.7

## 2010-12-19 NOTE — Progress Notes (Signed)
Rose Ball Date of Birth:  07/10/1945 [facility] [address]   [address] [place], [place]  [address] [phone]           Fax   [phone]  History of Present Illness: This pleasant 65 year old Caucasian female is seen for a scheduled pre-cardioversion office visit.  She has a history of previous intermittent atrial fibrillation but recently has been maintaining Atrial fibrillation despite higher doses of her Rythmol SR up to 425 mg every 12 hours.  She is also on metoprolol XL 100 mg daily.  She has not been Having a symptoms of overt congestive heart failure.  She does note lack of energy when she is in atrial fibrillation.  She has been adequately anticoagulated with Coumadin for more than the past 4 weeks.  Current Outpatient Prescriptions  Medication Sig Dispense Refill  . atorvastatin (LIPITOR) 10 MG tablet Take 1 tablet by mouth Daily.        . Cholecalciferol (VITAMIN D PO) Take 5,000 Units by mouth daily.        Marland Kitchen escitalopram (LEXAPRO) 20 MG tablet Take 1 tablet by mouth Daily. Lexapro      . metoprolol (TOPROL-XL) 100 MG 24 hr tablet Take one tab in a.m. And 1/2 tab in pm      . propafenone (RYTHMOL SR) 425 MG 12 hr capsule Take 1 capsule (425 mg total) by mouth 2 (two) times daily.  60 capsule  6  . VYVANSE 70 MG capsule Take 1 capsule by mouth Daily.      Marland Kitchen warfarin (COUMADIN) 5 MG tablet 1/2 tablet Mon, Wed, and Fri and 1 tablet other days or as directed  30 tablet  11  . nitroGLYCERIN (NITROSTAT) 0.4 MG SL tablet Place 0.4 mg under the tongue every 5 (five) minutes as needed.          Allergies  Allergen Reactions  . Amiodarone     dyspnea    Patient Active Problem List  Diagnoses  . Atrial fibrillation  . Encounter for long-term (current) use of anticoagulants  . Hypertension  . Paroxysmal atrial fibrillation  . Dyslipidemia  . Essential hypertension  . Palpitations  . Attention deficit disorder with hyperactivity  syndrome    History  Smoking status  . Never Smoker   Smokeless tobacco  . Not on file    History  Alcohol Use No    Family History  Problem Relation Age of Onset  . Coronary artery disease Father     Review of Systems: Constitutional: no fever chills diaphoresis or fatigue or change in weight.  Head and neck: no hearing loss, no epistaxis, no photophobia or visual disturbance. Respiratory: No cough, shortness of breath or wheezing. Cardiovascular: No chest pain peripheral edema, palpitations. Gastrointestinal: No abdominal distention, no abdominal pain, no change in bowel habits hematochezia or melena. Genitourinary: No dysuria, no frequency, no urgency, no nocturia. Musculoskeletal:No arthralgias, no back pain, no gait disturbance or myalgias. Neurological: No dizziness, no headaches, no numbness, no seizures, no syncope, no weakness, no tremors. Hematologic: No lymphadenopathy, no easy bruising. Psychiatric: No confusion, no hallucinations, no sleep disturbance.    Physical Exam: Filed Vitals:   12/19/10 1545  BP: 118/70  Pulse: 98  The general appearance reveals a large woman in no acute distress.  The skin reveals multiple areas of excoriation from previous poison ivy which began 6 weeks ago.  She had taken a course of oral steroids last week for the  poison ivy symptomsPupils equal and reactive.   Extraocular Movements are full.  There is no scleral icterus.  The mouth and pharynx are normal.  The neck is supple.  The carotids reveal no bruits.  The jugular venous pressure is normal.  The thyroid is not enlarged.  There is no lymphadenopathy.  The chest is clear to percussion and auscultation. There are no rales or rhonchi. Expansion of the chest is symmetrical.   heart reveals an irregular rhythm and no murmur gallop rub or click.The abdomen is soft and nontender. Bowel sounds are normal. The liver and spleen are not enlarged. There Are no abdominal masses. There are  no bruits.    Normal extremity without phlebitis or edema.  Pedal pulses are present.Strength is normal and symmetrical in all extremities.  There is no lateralizing weakness.  There are no sensory deficits.    Integument reveals residual poison ivy scarring  EKG today shows atrial fibrillation with a controlled ventricular response.  Regular rate and 98. Chest x-ray done yesterday shows normal heart size and clear lung fields. Pre-cardioversion lab work was drawn today and is pending   Assessment / Plan:  Proceed with planned cardioversion on 12/22/10 if blood work today is acceptable

## 2010-12-19 NOTE — Assessment & Plan Note (Signed)
The patient will continue her same medication for her attention deficit syndrome.

## 2010-12-19 NOTE — Assessment & Plan Note (Signed)
Her blood pressure today is satisfactory we will continue same medication

## 2010-12-19 NOTE — Patient Instructions (Signed)
Electrical Cardioversion Cardioversion is the delivery of a jolt of electricity to change the rhythm of the heart. Sticky patches or metal paddles are placed on the chest to deliver the electricity from a special device. This is done to restore a normal rhythm. A rhythm that is too fast or not regular keeps the heart from pumping well. Compared to medicines used to change an abnormal rhythm, cardioversion is faster and works better. It is also unpleasant and may dislodge blood clots from the heart. WHEN WOULD THIS BE DONE?  In an emergency:   There is low or no blood pressure as a result of the heart rhythm.   Normal rhythm must be restored as fast as possible to protect the brain and heart from further damage.   It may save a life.   For less serious heart rhythms, such as atrial fibrillation or flutter, in which:   The heart is beating too fast or is not regular.   The heart is still able to pump enough blood, but not as well as it should.   Medicine to change the rhythm has not worked.   It is safe to wait in order to allow time for preparation.  BEFORE THE PROCEDURE  You may have some tests to see how well your heart is working.   You may start taking blood thinners so your blood does not clot as easily.   Other drugs may be given to help your heart work better.  LET YOUR CAREGIVER KNOW ABOUT:  Every medicine you are taking. It is very important to do this! Know when to take or stop taking any of them.   Any time in the past that you have felt your heart was not beating normally.  RISKS AND COMPLICATIONS  Clots may form in the chambers of the heart if it is beating too fast. These clots may be dislodged during the procedure and travel to other parts of the body.   There is risk of a stroke during and after the procedure if a clot moves. Blood thinners lower this risk.   You may have a special test of your heart (TEE) to make sure there are no clots in your heart.    PROCEDURE (SCHEDULED)  The procedure is typically done in a hospital by a heart doctor (cardiologist).   You will be told when and where to go.   You may be given some medicine through an intravenous (IV) access to reduce discomfort and make you sleepy before the procedure.   Your whole body may move when the shock is delivered. Your chest may feel sore.   You may be able to go home after a few hours. Your heart rhythm will be watched to make sure it does not change.  HOME CARE INSTRUCTIONS  Only take medicine as directed by your caregiver. Be sure you understand how and when to take your medicine.   Learn how to feel your pulse and check it often.   Limit your activity for 48 hours.   Avoid caffeine and other stimulants as directed.  SEEK MEDICAL CARE IF:  You feel like your heart is beating too fast or your pulse is not regular.   You have any questions about your medicines.   You have bleeding that will not stop.  SEEK IMMEDIATE MEDICAL CARE IF:  You are dizzy or feel faint.   It is hard to breathe or you feel short of breath.   There is a change  in discomfort in your chest.   Your speech is slurred or you have trouble moving your arm or leg on one side.   You get a muscle cramp.   Your fingers or toes turn cold or blue.  MAKE SURE YOU:  Understand these instructions.   Will watch your condition.   Will get help right away if you are not doing well or get worse.  Document Released: 03/09/2002 Document Re-Released: 06/13/2009 [facility] Patient Information 2011 [place], [place].

## 2010-12-19 NOTE — Assessment & Plan Note (Signed)
This time the patient has been back in atrial fibrillation for about 3 months.  She had her echocardiogram on 11/16/10 which showed normal systolic function with an ejection fraction of 55-60% and slight enlargement of her left atrium at 4.4 cm.  Today we discussed the procedure of cardioversion and also gave her some printed material.  She is willing to proceed.  She has done a lot of reading on the Internet about atrial fibrillation and cardioversion and asked pertinent questions which were answered.  She is willing to proceed as planned with cardioversion to be done at 12 noon on Friday, September 21.  She will omit all of her medications on the morning of the procedure except she will take her Rythmol SR.  We will have her return a week later for followup office visit and protime and EKG.  Her INR here today was 2.7

## 2010-12-20 LAB — PROTIME-INR: Prothrombin Time: 26.9 s — ABNORMAL HIGH (ref 10.2–12.4)

## 2010-12-20 LAB — CBC WITH DIFFERENTIAL/PLATELET
Basophils Relative: 0.3 % (ref 0.0–3.0)
Eosinophils Relative: 2.7 % (ref 0.0–5.0)
HCT: 40.7 % (ref 36.0–46.0)
MCV: 89.8 fl (ref 78.0–100.0)
Monocytes Absolute: 0.5 10*3/uL (ref 0.1–1.0)
Neutrophils Relative %: 60.1 % (ref 43.0–77.0)
RBC: 4.54 Mil/uL (ref 3.87–5.11)
WBC: 6.4 10*3/uL (ref 4.5–10.5)

## 2010-12-20 LAB — APTT: aPTT: 39.4 s — ABNORMAL HIGH (ref 21.7–28.8)

## 2010-12-20 LAB — BASIC METABOLIC PANEL
Chloride: 106 mEq/L (ref 96–112)
Creatinine, Ser: 1.2 mg/dL (ref 0.4–1.2)
Potassium: 5.4 mEq/L — ABNORMAL HIGH (ref 3.5–5.1)
Sodium: 144 mEq/L (ref 135–145)

## 2010-12-21 ENCOUNTER — Encounter: Payer: 59 | Admitting: *Deleted

## 2010-12-21 NOTE — Progress Notes (Signed)
Voicemail at home and cell

## 2010-12-22 ENCOUNTER — Ambulatory Visit (HOSPITAL_COMMUNITY)
Admission: RE | Admit: 2010-12-22 | Discharge: 2010-12-22 | Disposition: A | Discharge status: Home / Self Care | Payer: 59 | Source: Ambulatory Visit | Attending: Cardiology | Admitting: Cardiology

## 2010-12-22 DIAGNOSIS — I4891 Unspecified atrial fibrillation: Secondary | ICD-10-CM

## 2010-12-22 DIAGNOSIS — Z01810 Encounter for preprocedural cardiovascular examination: Secondary | ICD-10-CM | POA: Insufficient documentation

## 2010-12-28 NOTE — Op Note (Signed)
  NAME:  Rose Ball, Rose Ball NO.:  [id number]  MEDICAL RECORD NO.:  [id number]  LOCATION:  [facility]                         FACILITY:  [facility]  PHYSICIAN:  Cassell Clement, M.D. DATE OF BIRTH:  Nov 02, 1945  DATE OF PROCEDURE:  12/22/2010 DATE OF DISCHARGE:  12/22/2010                              OPERATIVE REPORT   PROCEDURE:  Direct-current cardioversion.  HISTORY:  This 65 year old woman has a history of atrial fibrillation. She has been on antiarrhythmics, but this failed to convert.  She has been on adequate doses of Coumadin for the past months.  She is brought in now to short-stay for elective cardioversion.  The patient was given a total of 150 mg of propofol by Dr. Sondra Come, following which she was given a 120 J shock using the AP pads, but she failed to convert.  We then gave her 150 J and again she failed to convert and we gave her a third shock of 200 J and she converted to normal sinus rhythm.  The patient tolerated the procedure well.  There were no immediate postanesthetic complications.  IMPRESSION:  Successful direct-current cardioversion of atrial fibrillation, back to normal sinus rhythm, requiring 3 shocks.          ______________________________ Cassell Clement, M.D.     TB/MEDQ  D:  12/22/2010  T:  12/22/2010  Job:  [id number]  Electronically Signed by Cassell Clement M.D. on 12/28/2010 08:05:52 AM

## 2010-12-29 ENCOUNTER — Telehealth: Payer: Self-pay | Admitting: *Deleted

## 2010-12-29 ENCOUNTER — Encounter: Payer: Self-pay | Admitting: Nurse Practitioner

## 2010-12-29 ENCOUNTER — Ambulatory Visit (INDEPENDENT_AMBULATORY_CARE_PROVIDER_SITE_OTHER): Payer: 59 | Admitting: Nurse Practitioner

## 2010-12-29 ENCOUNTER — Encounter: Payer: 59 | Admitting: *Deleted

## 2010-12-29 DIAGNOSIS — I10 Essential (primary) hypertension: Secondary | ICD-10-CM

## 2010-12-29 DIAGNOSIS — IMO0002 Reserved for concepts with insufficient information to code with codable children: Secondary | ICD-10-CM

## 2010-12-29 DIAGNOSIS — I4891 Unspecified atrial fibrillation: Secondary | ICD-10-CM

## 2010-12-29 DIAGNOSIS — F909 Attention-deficit hyperactivity disorder, unspecified type: Secondary | ICD-10-CM

## 2010-12-29 DIAGNOSIS — Z7901 Long term (current) use of anticoagulants: Secondary | ICD-10-CM

## 2010-12-29 DIAGNOSIS — E669 Obesity, unspecified: Secondary | ICD-10-CM | POA: Insufficient documentation

## 2010-12-29 MED ORDER — METOPROLOL SUCCINATE ER 100 MG PO TB24: ORAL_TABLET | ORAL | Status: DC

## 2010-12-29 NOTE — Assessment & Plan Note (Signed)
She is back in atrial fib. Her cardioversion only lasted 2 days. She did not have a dramatic response for the 2 days that she was in sinus. I am not convinced that she is very symptomatic. I think she could have great benefit in an exercise and weight loss plan. I have talked with Dr. Patty Sermons. We will refer to EP for further opinion. I have explained that she could switch antiarrhythmics and repeat the cardioversion, possible ablation or stay in atrial fib and be committed to long term coumadin. She is agreeable to EP referral. Further disposition here to follow. Patient is agreeable to this plan and will call if any problems develop in the interim.

## 2010-12-29 NOTE — Progress Notes (Signed)
Rose Ball Date of Birth: 08-14-1945   History of Present Illness: Rose Ball is seen back today for a post hospital visit. She is seen for Dr. Patty Sermons. She had cardioversion one week ago. It was initially successful. She is back in atrial fib today. She thinks she went back in it on Sunday. She did not have a dramatic response in how she felt. She says she still feels bad. She has some palpitations. She is fatigued. She does not exercise. She remains on stimulant therapy for her ADD. No chest pain.   Current Outpatient Prescriptions on File Prior to Visit  Medication Sig Dispense Refill  . atorvastatin (LIPITOR) 10 MG tablet Take 1 tablet by mouth Daily.        . Cholecalciferol (VITAMIN D PO) Take 5,000 Units by mouth daily.        Marland Kitchen escitalopram (LEXAPRO) 20 MG tablet Take 1 tablet by mouth Daily. Lexapro      . nitroGLYCERIN (NITROSTAT) 0.4 MG SL tablet Place 0.4 mg under the tongue every 5 (five) minutes as needed.        . propafenone (RYTHMOL SR) 425 MG 12 hr capsule Take 1 capsule (425 mg total) by mouth 2 (two) times daily.  60 capsule  6  . VYVANSE 70 MG capsule Take 1 capsule by mouth Daily.      Marland Kitchen warfarin (COUMADIN) 5 MG tablet 1/2 tablet Mon, Wed, and Fri and 1 tablet other days or as directed  30 tablet  11  . DISCONTD: metoprolol (TOPROL-XL) 100 MG 24 hr tablet Take one tab in a.m. And 1/2 tab in pm        Allergies  Allergen Reactions  . Amiodarone     dyspnea    Past Medical History  Diagnosis Date  . Hypertension   . Paroxysmal atrial fibrillation     s/p cardioversion Sept 2012; failed  . Morbid obesity   . Attention deficit     on Vyvanse  . Dyslipidemia   . Essential hypertension   . Palpitations   . Hypokalemia   . Chronic anticoagulation   . Depression   . High risk medication use     on Rythmol SR    Past Surgical History  Procedure Date  . Breast biopsy   . Tonsillectomy   . Cardioversion Sept 2012    failed    History    Smoking status  . Never Smoker   Smokeless tobacco  . Not on file    History  Alcohol Use No    Family History  Problem Relation Age of Onset  . Coronary artery disease Father     Review of Systems: The review of systems is as above.  She is on more metoprolol for rate control. All other systems were reviewed and are negative.  Physical Exam: BP 130/90  Pulse 107  Ht 5\' 9"  (1.753 m)  Wt 276 lb 3.2 oz (125.283 kg)  BMI 40.79 kg/m2 Patient is pleasant and in no acute distress. She is obese. Skin is warm and dry. Color is normal.  HEENT is unremarkable. Normocephalic/atraumatic. PERRL. Sclera are nonicteric. Neck is supple. No masses. No JVD. Lungs are clear. Cardiac exam shows an irregular rhythm. Rate is just a little fast.  Abdomen is obese but soft. Extremities are without edema. Gait and ROM are intact. No gross neurologic deficits noted.   LABORATORY DATA: EKG shows recurrent atrial fib. Rate is 107.   Assessment / Plan:

## 2010-12-29 NOTE — Assessment & Plan Note (Signed)
Blood pressure is borderline. She is to stay on the extra metoprolol.

## 2010-12-29 NOTE — Telephone Encounter (Signed)
Message copied by Burnell Blanks on Fri Dec 29, 2010  9:40 AM ------      Message from: Cassell Clement      Created: Thu Dec 21, 2010  1:32 PM       Please report.Kidney function is normal.  Potassium is slightly high so avoid high potassium foods.Blood sugar is slightly high at 118 so avoid excessive carbohydrates and starches.The CBC is normal.  The magnesium is normal.The INR is therapeutic.  Labs are satisfactory for proceeding with planned elective cardioversion on 12/22/10

## 2010-12-29 NOTE — Assessment & Plan Note (Signed)
She remains on her ADD medicines. This could be contributing to her atrial fib.

## 2010-12-29 NOTE — Progress Notes (Signed)
Gave patient copy

## 2010-12-29 NOTE — Telephone Encounter (Signed)
Advised and gave her copy

## 2010-12-29 NOTE — Assessment & Plan Note (Signed)
Tried to be encouraging to working on her diet and weight. I think this could help her overall status.

## 2010-12-29 NOTE — Patient Instructions (Addendum)
Stay on your current medicines.  We will get you an appointment to see one of the EP doctors.  We will see what their opinion is and then let you know when to come back Your coumadin needs to be checked in about 2 weeks.  Call for any problems.

## 2011-01-12 ENCOUNTER — Ambulatory Visit (INDEPENDENT_AMBULATORY_CARE_PROVIDER_SITE_OTHER): Payer: 59 | Admitting: *Deleted

## 2011-01-12 DIAGNOSIS — I4891 Unspecified atrial fibrillation: Secondary | ICD-10-CM

## 2011-01-12 DIAGNOSIS — Z7901 Long term (current) use of anticoagulants: Secondary | ICD-10-CM

## 2011-01-15 ENCOUNTER — Ambulatory Visit (INDEPENDENT_AMBULATORY_CARE_PROVIDER_SITE_OTHER): Payer: 59 | Admitting: Internal Medicine

## 2011-01-15 ENCOUNTER — Encounter: Payer: Self-pay | Admitting: Internal Medicine

## 2011-01-15 DIAGNOSIS — I10 Essential (primary) hypertension: Secondary | ICD-10-CM

## 2011-01-15 DIAGNOSIS — R0602 Shortness of breath: Secondary | ICD-10-CM

## 2011-01-15 DIAGNOSIS — E669 Obesity, unspecified: Secondary | ICD-10-CM

## 2011-01-15 DIAGNOSIS — R0683 Snoring: Secondary | ICD-10-CM

## 2011-01-15 DIAGNOSIS — I4891 Unspecified atrial fibrillation: Secondary | ICD-10-CM

## 2011-01-15 NOTE — Patient Instructions (Addendum)
Your physician has recommended you make the following change in your medication: Stop Rhythmol  Your physician has recommended that you have a sleep study. This test records several body functions during sleep, including: brain activity, eye movement, oxygen and carbon dioxide blood levels, heart rate and rhythm, breathing rate and rhythm, the flow of air through your mouth and nose, snoring, body muscle movements, and chest and belly movement.  The hospital will call you Friday, February 02, 2011 when they have a bed available.  You will need to have labwork (BMET, Magnesium, INR) and an EKG on Thursday, February 01, 2011.  You do not need to be fasting for the blood work.  Our lab opens at 8:30 AM.

## 2011-01-15 NOTE — Assessment & Plan Note (Signed)
Rose Ball has symptomatic persistent atrial fibrillation.  She has failed medical therapy with rhythmol.  She has taken amiodarone previously and is not sure why this was discontinued.  Therapeutic strategies for afib including medicine and ablation were discussed in detail with the patient today.  Given persistent afib, her anticipated success rate with ablation is on the order of 60% and would like require multiple procedures.  At this time, I would recommend initiations of tikosyn.  If she fails medical therapy with tikosyn, then we could consider ablation.  The importance of lifestyle modification including weight loss was also discussed today.  She will continue anticoagulation with coumadin.  Her rhythmol was stopped today.

## 2011-01-15 NOTE — Assessment & Plan Note (Signed)
The importance of adequate BP control for treatment of afib was discussed today.

## 2011-01-15 NOTE — Assessment & Plan Note (Signed)
Weight loss advised  She snores and therefore may have sleep apnea.  I will order a sleep study to further evaluation for sleep apnea.

## 2011-01-15 NOTE — Progress Notes (Signed)
Rose Ball is a pleasant 65 y.o. WF patient with a h/o obesity, HTN, and persistent atrial fibrillation who presents today for EP consultation.  She reports being diagnosed with afib about 10 years ago.  She presented 2004 with afib and converted to sinus rhythm with diltiazem.  She reports increasing frequency and duration of afib subsequently.  She subsequently was placed on amiodarone.  This was stopped though she is not sure why.  She subsequently was placed on rhythmol with good afib control.  Unfortunately, he afib has continued to increase in frequency and duration.  She has been placed on coumadin for years now.  She presented 9/12 with persistent afib requiring cardioversion.  She returned to afib within 48 hours.  She has remained in afib since that time.  During afib, she reports symptoms of palpitations, SOB, and fatigue.  Her exercise tolerance is also decreased.  She snores.  She reports having a sleep study 5 years ago.  She states that she did not have sleep apnea at that time.  Today, she denies symptoms of chest pain, orthopnea, PND, lower extremity edema, dizziness, presyncope, syncope, or neurologic sequela. The patient is tolerating medications without difficulties and is otherwise without complaint today.   Past Medical History  Diagnosis Date  . Hypertension   . Persistent atrial fibrillation     s/p cardioversion Sept 2012; failed  . Morbid obesity   . Attention deficit     on Vyvanse  . Dyslipidemia   . Palpitations   . Hypokalemia   . Chronic anticoagulation   . Depression    Past Surgical History  Procedure Date  . Breast biopsy   . Tonsillectomy   . Cardioversion Sept 2012    failed  . US echocardiography Aug 2012    Normal systolic function with EF 55% and slight LAE at 4.4cm    Current Outpatient Prescriptions  Medication Sig Dispense Refill  . atorvastatin (LIPITOR) 10 MG tablet Take 1 tablet by mouth Daily.        . Cholecalciferol (VITAMIN D  PO) Take 5,000 Units by mouth daily.        Marland Kitchen escitalopram (LEXAPRO) 20 MG tablet Take 1 tablet by mouth Daily. Lexapro      . metoprolol (TOPROL-XL) 100 MG 24 hr tablet Take one whole tablet in the mornings and a half of a tablet at night.  45 tablet  6  . nitroGLYCERIN (NITROSTAT) 0.4 MG SL tablet Place 0.4 mg under the tongue every 5 (five) minutes as needed.        . propafenone (RYTHMOL SR) 425 MG 12 hr capsule Take 1 capsule (425 mg total) by mouth 2 (two) times daily.  60 capsule  6  . rOPINIRole (REQUIP) 1 MG tablet Take 1 mg by mouth at bedtime.        Marland Kitchen VYVANSE 70 MG capsule Take 1 capsule by mouth Daily.      Marland Kitchen warfarin (COUMADIN) 5 MG tablet as directed       . DISCONTD: warfarin (COUMADIN) 5 MG tablet 1/2 tablet Mon, Wed, and Fri and 1 tablet other days or as directed  30 tablet  11    Allergies  Allergen Reactions  . Amiodarone     dyspnea    History   Social History  . Marital Status: Married    Spouse Name: N/A    Number of Children: N/A  . Years of Education: N/A   Occupational History  . Not on  file.   Social History Main Topics  . Smoking status: Never Smoker   . Smokeless tobacco: Not on file  . Alcohol Use: No  . Drug Use: No  . Sexually Active: Not on file   Other Topics Concern  . Not on file   Social History Narrative   Pt lives in [place] alone.  Works at [employer]    Family History  Problem Relation Age of Onset  . Coronary artery disease Father     ROS- All systems are reviewed and negative except as per the HPI above  Physical Exam: Filed Vitals:   01/15/11 1045  BP: 146/81  Pulse: 94  Height: 5\' 9"  (1.753 m)  Weight: 231 lb (104.781 kg)    GEN- The patient is overweight appearing, alert and oriented x 3 today.   Head- normocephalic, atraumatic Eyes-  Sclera clear, conjunctiva pink Ears- hearing intact Oropharynx- clear Neck- supple, no JVP Lymph- no cervical lymphadenopathy Lungs- Clear to  ausculation bilaterally, normal work of breathing Heart- irregular rate and rhythm, no murmurs, rubs or gallops, PMI not laterally displaced GI- soft, NT, ND, + BS Extremities- no clubbing, cyanosis, or edema MS- no significant deformity or atrophy Skin- no rash or lesion Psych- euthymic mood, full affect Neuro- strength and sensation are intact  EKG today reveals afib, V rate 94 bpm, Qtc 388, otherwise normal ekg  Assessment and Plan:

## 2011-01-24 ENCOUNTER — Telehealth: Payer: Self-pay | Admitting: Internal Medicine

## 2011-01-24 NOTE — Telephone Encounter (Signed)
Pt wants to reschedule procedure she is to have at hospital please call

## 2011-01-24 NOTE — Telephone Encounter (Signed)
I will forward to Kennon Rounds as she is scheduled for a Tikosyn load

## 2011-01-25 NOTE — Telephone Encounter (Signed)
Spoke with pt.  She would like to move Tikosyn admission to 10/31.  Will move appt and make changes with [facility].

## 2011-01-26 ENCOUNTER — Ambulatory Visit (INDEPENDENT_AMBULATORY_CARE_PROVIDER_SITE_OTHER): Payer: 59 | Admitting: *Deleted

## 2011-01-26 DIAGNOSIS — Z7901 Long term (current) use of anticoagulants: Secondary | ICD-10-CM

## 2011-01-26 DIAGNOSIS — I4891 Unspecified atrial fibrillation: Secondary | ICD-10-CM

## 2011-01-26 LAB — POCT INR: INR: 2.9

## 2011-01-31 ENCOUNTER — Ambulatory Visit: Payer: 59

## 2011-02-01 ENCOUNTER — Ambulatory Visit (INDEPENDENT_AMBULATORY_CARE_PROVIDER_SITE_OTHER): Payer: 59 | Admitting: Pharmacist

## 2011-02-01 ENCOUNTER — Other Ambulatory Visit: Payer: 59 | Admitting: *Deleted

## 2011-02-01 ENCOUNTER — Other Ambulatory Visit: Payer: Self-pay

## 2011-02-01 ENCOUNTER — Inpatient Hospital Stay (HOSPITAL_COMMUNITY)
Admission: AD | Admit: 2011-02-01 | Discharge: 2011-02-06 | DRG: 310 | Disposition: A | Discharge status: Home / Self Care | Payer: 59 | Source: Ambulatory Visit | Attending: Internal Medicine | Admitting: Internal Medicine

## 2011-02-01 VITALS — BP 149/82 | HR 92 | Ht 69.0 in | Wt 279.8 lb

## 2011-02-01 DIAGNOSIS — F909 Attention-deficit hyperactivity disorder, unspecified type: Secondary | ICD-10-CM | POA: Diagnosis present

## 2011-02-01 DIAGNOSIS — F329 Major depressive disorder, single episode, unspecified: Secondary | ICD-10-CM | POA: Diagnosis present

## 2011-02-01 DIAGNOSIS — E875 Hyperkalemia: Secondary | ICD-10-CM | POA: Diagnosis present

## 2011-02-01 DIAGNOSIS — I10 Essential (primary) hypertension: Secondary | ICD-10-CM | POA: Diagnosis present

## 2011-02-01 DIAGNOSIS — E785 Hyperlipidemia, unspecified: Secondary | ICD-10-CM | POA: Diagnosis present

## 2011-02-01 DIAGNOSIS — G4730 Sleep apnea, unspecified: Secondary | ICD-10-CM | POA: Diagnosis present

## 2011-02-01 DIAGNOSIS — F3289 Other specified depressive episodes: Secondary | ICD-10-CM | POA: Diagnosis present

## 2011-02-01 DIAGNOSIS — I4891 Unspecified atrial fibrillation: Principal | ICD-10-CM | POA: Diagnosis present

## 2011-02-01 DIAGNOSIS — Z79899 Other long term (current) drug therapy: Secondary | ICD-10-CM

## 2011-02-01 DIAGNOSIS — Z7901 Long term (current) use of anticoagulants: Secondary | ICD-10-CM

## 2011-02-01 DIAGNOSIS — E669 Obesity, unspecified: Secondary | ICD-10-CM | POA: Diagnosis present

## 2011-02-01 LAB — BASIC METABOLIC PANEL
CO2: 30 mEq/L (ref 19–32)
Calcium: 8.9 mg/dL (ref 8.4–10.5)
Creat: 0.95 mg/dL (ref 0.50–1.10)

## 2011-02-01 NOTE — Progress Notes (Signed)
Rose Ball is a pleasant 65 y.o. W[facility] patient who was recently evaluated by Dr. Johney Frame for atrial fibrillation.  She has previously been treated with diltiazem, amiodarone, and Rhythmol.  Dr. Johney Frame noted increase in frequency and duration on Rhythmol at last office visit.  He discussed options with patient and it was decided to stop Rhythmol and start Tikosyn.   Pt seen today in anticipation of admission for Tikosyn start.    Pt educated on side effects and importance of Tikosyn today in clinic.  She is aware to call the office with any missed doses.  Reviewed patient's medication list.  She took her last dose of Rhythmol on 10/15.  She is on no contraindicated or QTc prolonging medications.   Of note, pt's medication list stated metoprolol succinate 100mg  in AM and 50mg  in PM but pt is only taking 100mg  in AM.   She has been properly anticoagulated with Coumadin.    Spoke with pt's prescription drug coverage company.  Tikosyn is covered with no PA required.  Her cost will be $37/month or $99/90 day supply.     Current Outpatient Prescriptions  Medication Sig Dispense Refill  . atorvastatin (LIPITOR) 10 MG tablet Take 1 tablet by mouth Daily.        . Cholecalciferol (VITAMIN D PO) Take 5,000 Units by mouth daily.        Marland Kitchen escitalopram (LEXAPRO) 20 MG tablet Take 1 tablet by mouth Daily. Lexapro      . metoprolol (TOPROL-XL) 100 MG 24 hr tablet Take 100 mg by mouth daily.       . nitroGLYCERIN (NITROSTAT) 0.4 MG SL tablet Place 0.4 mg under the tongue every 5 (five) minutes as needed.        Marland Kitchen rOPINIRole (REQUIP) 1 MG tablet Take 1 mg by mouth at bedtime.        Marland Kitchen VYVANSE 70 MG capsule Take 1 capsule by mouth Daily.      Marland Kitchen warfarin (COUMADIN) 5 MG tablet as directed       . DISCONTD: metoprolol (TOPROL-XL) 100 MG 24 hr tablet Take one whole tablet in the mornings and a half of a tablet at night.  45 tablet  6    Allergies  Allergen Reactions  . Amiodarone     dyspnea

## 2011-02-01 NOTE — Assessment & Plan Note (Addendum)
EKG reviewed.  QTc from 10/15 was 388.  Labs drawn today.  INR -3.5 and has been therapeutic for the past month.  K- 4.5 and Mag 2.0.  Her SCr was 0.95 which correlates to a CrCl of 120 mL/min.  Will start patient on BID.  Room available on 3700.  Pt aware.

## 2011-02-02 ENCOUNTER — Other Ambulatory Visit: Payer: Self-pay

## 2011-02-02 DIAGNOSIS — I4891 Unspecified atrial fibrillation: Secondary | ICD-10-CM

## 2011-02-02 LAB — BASIC METABOLIC PANEL
BUN: 19 mg/dL (ref 6–23)
BUN: 18 mg/dL (ref 6–23)
Calcium: 9.2 mg/dL (ref 8.4–10.5)
Chloride: 103 mEq/L (ref 96–112)
Creatinine, Ser: 0.86 mg/dL (ref 0.50–1.10)
Creatinine, Ser: 0.86 mg/dL (ref 0.50–1.10)
GFR calc Af Amer: 81 mL/min — ABNORMAL LOW (ref >90)
GFR calc non Af Amer: 70 mL/min — ABNORMAL LOW (ref >90)
Glucose, Bld: 136 mg/dL — ABNORMAL HIGH (ref 70–99)
Potassium: 3.8 mEq/L (ref 3.5–5.1)

## 2011-02-03 ENCOUNTER — Other Ambulatory Visit: Payer: Self-pay

## 2011-02-03 LAB — BASIC METABOLIC PANEL
BUN: 17 mg/dL (ref 6–23)
Chloride: 102 mEq/L (ref 96–112)
GFR calc non Af Amer: 74 mL/min — ABNORMAL LOW (ref >90)
Glucose, Bld: 108 mg/dL — ABNORMAL HIGH (ref 70–99)
Potassium: 4.1 mEq/L (ref 3.5–5.1)

## 2011-02-03 MED ORDER — METOPROLOL SUCCINATE ER 100 MG PO TB24
100.0000 mg | ORAL_TABLET | Freq: Every day | ORAL | Status: DC
  Administered 2011-02-04 – 2011-02-05 (×2): 100 mg via ORAL
  Filled 2011-02-03 (×2): qty 1

## 2011-02-03 MED ORDER — POTASSIUM CHLORIDE CRYS ER 20 MEQ PO TBCR
20.0000 meq | EXTENDED_RELEASE_TABLET | Freq: Two times a day (BID) | ORAL | Status: DC
  Administered 2011-02-04 – 2011-02-06 (×5): 20 meq via ORAL
  Filled 2011-02-03 (×4): qty 1

## 2011-02-03 MED ORDER — ZOLPIDEM TARTRATE 5 MG PO TABS: 10.0000 mg | ORAL_TABLET | Freq: Every evening | ORAL | Status: DC | PRN

## 2011-02-03 MED ORDER — ROPINIROLE HCL 1 MG PO TABS
1.0000 mg | ORAL_TABLET | Freq: Every day | ORAL | Status: DC
  Administered 2011-02-04 – 2011-02-05 (×2): 1 mg via ORAL
  Filled 2011-02-03 (×4): qty 1

## 2011-02-03 MED ORDER — LISDEXAMFETAMINE DIMESYLATE 70 MG PO CAPS
70.0000 mg | ORAL_CAPSULE | ORAL | Status: DC
  Administered 2011-02-04 – 2011-02-06 (×3): 70 mg via ORAL
  Filled 2011-02-03: qty 1

## 2011-02-03 MED ORDER — ACETAMINOPHEN 325 MG PO TABS
650.0000 mg | ORAL_TABLET | ORAL | Status: DC | PRN
  Filled 2011-02-03: qty 2

## 2011-02-03 MED ORDER — ONDANSETRON HCL 4 MG/2ML IJ SOLN: 4.0000 mg | Freq: Four times a day (QID) | INTRAMUSCULAR | Status: DC | PRN

## 2011-02-03 MED ORDER — ESCITALOPRAM OXALATE 20 MG PO TABS
20.0000 mg | ORAL_TABLET | Freq: Every day | ORAL | Status: DC
  Administered 2011-02-04 – 2011-02-06 (×3): 20 mg via ORAL
  Filled 2011-02-03 (×3): qty 1

## 2011-02-03 MED ORDER — VITAMIN D3 25 MCG (1000 UNIT) PO TABS
5000.0000 [IU] | ORAL_TABLET | Freq: Every day | ORAL | Status: DC
  Administered 2011-02-04 – 2011-02-05 (×2): 5000 [IU] via ORAL
  Filled 2011-02-03 (×6): qty 5

## 2011-02-03 MED ORDER — DOFETILIDE 125 MCG PO CAPS
125.0000 ug | ORAL_CAPSULE | Freq: Two times a day (BID) | ORAL | Status: DC
  Filled 2011-02-03: qty 1

## 2011-02-03 MED ORDER — SIMVASTATIN 20 MG PO TABS
20.0000 mg | ORAL_TABLET | Freq: Every day | ORAL | Status: DC
  Administered 2011-02-04 – 2011-02-05 (×2): 20 mg via ORAL
  Filled 2011-02-03 (×4): qty 1

## 2011-02-03 MED ORDER — SODIUM CHLORIDE 0.9 % IJ SOLN
3.0000 mL | Freq: Two times a day (BID) | INTRAMUSCULAR | Status: DC
  Administered 2011-02-04 – 2011-02-06 (×5): 3 mL via INTRAVENOUS

## 2011-02-04 ENCOUNTER — Other Ambulatory Visit: Payer: Self-pay

## 2011-02-04 LAB — BASIC METABOLIC PANEL
CO2: 27 mEq/L (ref 19–32)
Glucose, Bld: 116 mg/dL — ABNORMAL HIGH (ref 70–99)
Potassium: 4.5 mEq/L (ref 3.5–5.1)
Sodium: 139 mEq/L (ref 135–145)

## 2011-02-04 MED ORDER — DOFETILIDE 125 MCG PO CAPS
125.0000 ug | ORAL_CAPSULE | Freq: Two times a day (BID) | ORAL | Status: DC
  Administered 2011-02-04 (×2): 125 ug via ORAL
  Filled 2011-02-04 (×4): qty 1

## 2011-02-04 MED ORDER — WARFARIN SODIUM 5 MG PO TABS
5.0000 mg | ORAL_TABLET | Freq: Once | ORAL | Status: AC
  Administered 2011-02-04: 5 mg via ORAL
  Filled 2011-02-04: qty 1

## 2011-02-04 NOTE — Progress Notes (Signed)
ANTICOAGULATION CONSULT NOTE - Follow Up Consult  Pharmacy Consult for Coumadin Indication: atrial fibrillation  Allergies  Allergen Reactions  . Amiodarone     dyspnea    Patient Measurements: Height: 5\' 9"  (175.3 cm) (Entered for Cutover) Weight: 281 lb 1.4 oz (127.5 kg) (Entered for Cutover) IBW/kg (Calculated) : 66.2  Adjusted Body Weight: 84.6kg  Vital Signs: Temp: 98.5 F (36.9 C) (11/04 0500) Temp src: Oral (11/04 0500) BP: 143/91 mmHg (11/04 0500) Pulse Rate: 93  (11/04 0500)  Labs:  Basename 02/04/11 0630 02/03/11 0500 02/02/11 1110 02/02/11 0527  HGB -- -- -- --  HCT -- -- -- --  PLT -- -- -- --  APTT -- -- -- --  LABPROT 24.0* 26.5* -- 28.2*  INR 2.11* 2.39* -- 2.59*  HEPARINUNFRC -- -- -- --  CREATININE 0.91 0.82 0.86 --  CKTOTAL -- -- -- --  CKMB -- -- -- --  TROPONINI -- -- -- --   Estimated Creatinine Clearance: 89.4 ml/min (by C-G formula based on Cr of 0.91).   Assessment: 65yoF on Coumadin for Atrial Fibrillation (on Coumadin prior to admission). INR therapeutic but trending down on home dose (2.5mg  daily except 5mg  MWF).   Goal of Therapy:  INR 2-3   Plan:  Coumadin 5mg  PO x 1 today at 1800 Follow-up INR in AM and possible plans for cardioversion  Sabra Heck 02/04/2011,1:01 PM

## 2011-02-04 NOTE — Progress Notes (Signed)
Subjective:  The patient has been feeling well.  Denies any chest pain or shortness of breath.  He has not been aware of any forceful palpitations.  She remains on Tikosyn loading 125 mcg every 12 hours.  Her electrocardiogram done at 0837 today shows ongoing atrial flutter fibrillation with QTC of 477 ms.  Her potassium remains normal.  Objective:  Vital Signs in the last 24 hours: Temp:  [98 F (36.7 C)-98.7 F (37.1 C)] 98.5 F (36.9 C) (11/04 0500) Pulse Rate:  [93-98] 93  (11/04 0500) Resp:  [18-20] 20  (11/04 0500) BP: (132-159)/(71-96) 143/91 mmHg (11/04 0500) SpO2:  [96 %-98 %] 96 % (11/04 0500)  Intake/Output from previous day: 11/03 0701 - 11/04 0700 In: 720 [P.O.:720] Out: -  Intake/Output from this shift:     Physical Exam: Vital signs are stable.  General appearance reveals a large woman in no acute distress.Pupils equal and reactive.   Extraocular Movements are full.  There is no scleral icterus.  The mouth and pharynx are normal.  The neck is supple.  The carotids reveal no bruits.  The jugular venous pressure is normal.  The thyroid is not enlarged.  There is no lymphadenopathy.  The chest is clear to percussion and auscultation. There are no rales or rhonchi. Expansion of the chest is symmetrical.  The heart reveals no murmur gallop or rub.  The rhythm is irregular in atrial fibrillation.  The abdomen is soft and nontender. Bowel sounds are normal. The liver and spleen are not enlarged. There Are no abdominal masses. There are no bruits.  The pedal pulses are good.  There is no phlebitis or edema.  There is no cyanosis or clubbing. Strength is normal and symmetrical in all extremities.  There is no lateralizing weakness.  There are no sensory deficits.    Lab Results: No results found for this basename: WBC:2,HGB:2,PLT:2 in the last 72 hours  Basename 02/04/11 0630 02/03/11 0500  NA 139 139  K 4.5 4.1  CL 102 102  CO2 27 26  GLUCOSE 116* 108*  BUN 17 17    CREATININE 0.91 0.82   No results found for this basename: TROPONINI:2,CK,MB:2 in the last 72 hours Hepatic Function Panel No results found for this basename: PROT,ALBUMIN,AST,ALT,ALKPHOS,BILITOT,BILIDIR,IBILI in the last 72 hours No results found for this basename: CHOL in the last 72 hours No results found for this basename: PROTIME in the last 72 hours  Imaging: Imaging results have been reviewed  Cardiac Studies:  Assessment/Plan:  Atrial Fibrillation  If she does not convert to normal sinus rhythm today we will plan elective direct current cardioversion for Monday, November 5.  We will recheck her electrolytes and prothrombin time in the morning.  LOS: 3 days    Cassell Clement 02/04/2011, 9:59 AM

## 2011-02-05 ENCOUNTER — Encounter (HOSPITAL_COMMUNITY)
Admission: AD | Disposition: A | Discharge status: Home / Self Care | Payer: Self-pay | Source: Ambulatory Visit | Attending: Internal Medicine

## 2011-02-05 ENCOUNTER — Ambulatory Visit (HOSPITAL_COMMUNITY): Admit: 2011-02-05 | Payer: Self-pay | Admitting: Internal Medicine

## 2011-02-05 ENCOUNTER — Other Ambulatory Visit: Payer: Self-pay

## 2011-02-05 HISTORY — PX: CARDIOVERSION: SHX1299

## 2011-02-05 LAB — BASIC METABOLIC PANEL
BUN: 16 mg/dL (ref 6–23)
Creatinine, Ser: 0.92 mg/dL (ref 0.50–1.10)
GFR calc Af Amer: 75 mL/min — ABNORMAL LOW (ref >90)
GFR calc non Af Amer: 64 mL/min — ABNORMAL LOW (ref >90)
Potassium: 5.4 mEq/L — ABNORMAL HIGH (ref 3.5–5.1)

## 2011-02-05 LAB — PROTIME-INR: INR: 2 — ABNORMAL HIGH (ref 0.00–1.49)

## 2011-02-05 SURGERY — CARDIOVERSION
Anesthesia: Monitor Anesthesia Care

## 2011-02-05 SURGERY — CARDIOVERSION
Anesthesia: LOCAL | Wound class: Clean

## 2011-02-05 MED ORDER — FENTANYL CITRATE 0.05 MG/ML IJ SOLN
INTRAMUSCULAR | Status: AC
  Filled 2011-02-05: qty 2

## 2011-02-05 MED ORDER — MIDAZOLAM HCL 5 MG/5ML IJ SOLN
INTRAMUSCULAR | Status: AC
  Filled 2011-02-05: qty 5

## 2011-02-05 MED ORDER — DIGOXIN 0.25 MG/ML IJ SOLN
0.2500 mg | Freq: Every day | INTRAMUSCULAR | Status: DC
  Administered 2011-02-05 – 2011-02-06 (×2): 0.25 mg via INTRAVENOUS
  Filled 2011-02-05 (×2): qty 1

## 2011-02-05 MED ORDER — METOPROLOL SUCCINATE ER 100 MG PO TB24
100.0000 mg | ORAL_TABLET | Freq: Two times a day (BID) | ORAL | Status: DC
  Administered 2011-02-05 – 2011-02-06 (×2): 100 mg via ORAL
  Filled 2011-02-05 (×4): qty 1

## 2011-02-05 MED ORDER — WARFARIN SODIUM 5 MG PO TABS
5.0000 mg | ORAL_TABLET | Freq: Once | ORAL | Status: AC
  Administered 2011-02-05: 5 mg via ORAL
  Filled 2011-02-05: qty 1

## 2011-02-05 MED ORDER — DOFETILIDE 250 MCG PO CAPS
250.0000 ug | ORAL_CAPSULE | Freq: Two times a day (BID) | ORAL | Status: DC
  Administered 2011-02-05: 250 ug via ORAL
  Filled 2011-02-05 (×5): qty 1

## 2011-02-05 MED ORDER — SODIUM CHLORIDE 0.9 % IV SOLN: INTRAVENOUS | Status: DC

## 2011-02-05 NOTE — Progress Notes (Signed)
  I have reviewed the patient's history and physical, and assessment and plan. I have re-examined the patient. I have discussed the risks/benefits/goals/ and expectations of the procedure with the patient and they wish to proceed with DCCV.   Lewayne Bunting 02/05/2011 10:59 AM

## 2011-02-05 NOTE — Progress Notes (Signed)
Cardiology Rounds  SUBJECTIVE: The patient is doing well today.  At this time, she denies chest pain, shortness of breath, or any new concerns.  She remains in afib.   OBJECTIVE: Physical Exam: Filed Vitals:   02/04/11 0500 02/04/11 1300 02/04/11 2151 02/05/11 0622  BP: 143/91 154/87 138/95 149/87  Pulse: 93 110 119 108  Temp: 98.5 F (36.9 C) 99 F (37.2 C) 99.2 F (37.3 C) 97.9 F (36.6 C)  TempSrc: Oral Oral Oral Oral  Resp: 20 16 20 17   Height:      Weight:      SpO2: 96% 94% 94% 94%    Intake/Output Summary (Last 24 hours) at 02/05/11 1610 Last data filed at 02/04/11 1814  Gross per 24 hour  Intake    360 ml  Output      0 ml  Net    360 ml    Telemetry reveals afib with V rates 120s, no arrhythmias  GEN- The patient is well appearing, alert and oriented x 3 today.   Head- normocephalic, atraumatic Eyes-  Sclera clear, conjunctiva pink Ears- hearing intact Oropharynx- clear Neck- supple, no JVP Lymph- no cervical lymphadenopathy Lungs- Clear to ausculation bilaterally, normal work of breathing Heart- tachycardic irregular rhythm, no murmurs, rubs or gallops, PMI not laterally displaced GI- soft, NT, ND, + BS Extremities- no clubbing, cyanosis, or edema  LABS: Basic Metabolic Panel:  Basename 02/04/11 0630 02/03/11 0500  NA 139 139  K 4.5 4.1  CL 102 102  CO2 27 26  GLUCOSE 116* 108*  BUN 17 17  CREATININE 0.91 0.82  CALCIUM 9.3 9.3  MG -- --  PHOS -- --   Labs pending for today     . cholecalciferol  5,000 Units Oral Daily  . dofetilide  250 mcg Oral Q12H  . escitalopram  20 mg Oral Daily  . lisdexamfetamine  70 mg Oral QAM  . metoprolol succinate  100 mg Oral Daily  . potassium chloride  20 mEq Oral BID  . rOPINIRole  1 mg Oral QHS  . simvastatin  20 mg Oral q1800  . sodium chloride  3 mL Intravenous Q12H  . warfarin  5 mg Oral ONCE-1800  . DISCONTD: dofetilide  125 mcg Oral Q12H    EKG- afib, Qtc 470   ASSESSMENT AND PLAN:  1.   Afib with RVR-  She remains stable on tikosyn,  I have reviewed her EKGs, which reveal afib with coarse baseline.  Her QT has been stable 470s, not 500 as ekg read suggests.  I will therefore increase tikosyn back to bid and monitor. She will undergo cardioversion later today.  We will observe overnight in sinus and plan DC home in am. Continue coumadin (goal INR 2-3) Consider adjusting rate control medications if she has more afib.     Hillis Range, MD 02/05/2011 8:22 AM

## 2011-02-05 NOTE — Progress Notes (Signed)
Coumadin Protocol Per Pharmacy  INR today = 2.0 DCCV today, still in afib On coumadin PTA for afib  Plan: 1) Coumadin 5 mg po x 1 dose today          2) Continue daily PT/INR  Thanks

## 2011-02-05 NOTE — Progress Notes (Signed)
Pts QTc prior to scheduled dose of Tikosyn =501.Marland KitchenMarland KitchenMarland KitchenWard Givens NP notified--Ok to give scheduled dose. Dierdre Highman

## 2011-02-05 NOTE — Brief Op Note (Signed)
EP Procedure Note  Procedure: DCCV  Indication: Atrial fib with an RVR  Findings: After informed consent was obtained, the patient was prepped in the usual manner. The electro-dispersive pad was placed in the AP position. 360 Joules of synchronized energy was delivered via the electro-dispersive pad, restoring NSR. The patient subsequently had ERAF. She was returned to her room in satisfactory condition still in atrial fib.   Lewayne Bunting 02/05/2011 11:26 AM

## 2011-02-06 LAB — BASIC METABOLIC PANEL
BUN: 21 mg/dL (ref 6–23)
CO2: 27 mEq/L (ref 19–32)
Chloride: 101 mEq/L (ref 96–112)
GFR calc Af Amer: 75 mL/min — ABNORMAL LOW (ref >90)
Glucose, Bld: 113 mg/dL — ABNORMAL HIGH (ref 70–99)
Potassium: 4.5 mEq/L (ref 3.5–5.1)

## 2011-02-06 MED ORDER — METOPROLOL TARTRATE 100 MG PO TABS: 100.0000 mg | ORAL_TABLET | Freq: Two times a day (BID) | ORAL | Status: DC

## 2011-02-06 MED ORDER — DIGOXIN 125 MCG PO TABS: 125.0000 ug | ORAL_TABLET | Freq: Every day | ORAL | Status: DC

## 2011-02-06 NOTE — Discharge Summary (Signed)
Physician Discharge Summary  Patient ID: Rose Ball MRN: [id number] DOB/AGE: [birth year] 65 y.o.  Admit date: 02/01/2011 Discharge date: 02/06/2011  Primary Discharge Diagnosis:  Atrial fibrillation  Secondary Discharge Diagnosis hypertension, obesity, hyperlipidemia  Significant Diagnostic Studies: cardioversion   Consults: none  Hospital Course:  Rose Ball was admitted to [facility] 02/01/11 for elective initiation of tikosyn.  She was initially placed on tikosyn 500 mcg BID.  Due to QT prolongation, her dose was decreased to BID.  She underwent planned cardioversion by Dr Ladona Ridgel 02/05/11.  Though she initially converted to sinus, she immediately returned to afib.  She remained in afib thereafter.  Metoprolol was increased to 100mg  BID and tikosyn was discontinued. At time of discharge the patient was alert, ambulatory, and otherwise without complaint.  Discharge Exam: Blood pressure 146/86, pulse 102, temperature 98.5 F (36.9 C), temperature source Oral, resp. rate 18, height 5\' 9"  (1.753 m), weight 281 lb 1.4 oz (127.5 kg), SpO2 94.00%.   (see 02/06/11 rounding note)  Labs:   Lab Results  Component Value Date   WBC 6.4 12/19/2010   HGB 13.6 12/19/2010   HCT 40.7 12/19/2010   MCV 89.8 12/19/2010   PLT 257.0 12/19/2010    Lab 02/06/11 0600  NA 139  K 4.5  CL 101  CO2 27  BUN 21  CREATININE 0.92  CALCIUM 9.2  PROT --  BILITOT --  ALKPHOS --  ALT --  AST --  GLUCOSE 113*   FOLLOW UP PLANS AND APPOINTMENTS Discharge Orders    Future Appointments: Provider: Department: Dept Phone: Center:   02/16/2011 12:30 PM Raul Del, RN [facility] [phone] None   02/21/2011 11:00 AM Gardiner Rhyme, MD [facility] [phone] [facility]     Future Orders Please Complete By Expires   Diet - low sodium heart healthy      Increase activity slowly      Call MD for:      Scheduling Instructions:   Any concerns     Current Discharge  Medication List    START taking these medications   Details  digoxin (LANOXIN) 0.125 MG tablet Take 1 tablet (125 mcg total) by mouth daily. Qty: 30 tablet, Refills: 3    metoprolol tartrate (LOPRESSOR) 100 MG tablet Take 1 tablet (100 mg total) by mouth 2 (two) times daily. Qty: 60 tablet, Refills: 3      CONTINUE these medications which have NOT CHANGED   Details  atorvastatin (LIPITOR) 10 MG tablet Take 10 mg by mouth Daily.     Cholecalciferol (VITAMIN D PO) Take 5,000 Units by mouth daily.     escitalopram (LEXAPRO) 20 MG tablet Take 20 mg by mouth Daily. Lexapro    rOPINIRole (REQUIP) 1 MG tablet Take 1 mg by mouth at bedtime.      VYVANSE 70 MG capsule Take 70 mg by mouth Daily.     warfarin (COUMADIN) 5 MG tablet Take 2.5-5 mg by mouth daily. as directed; 2.5mg  every day except 5 mg on Monday, Wednesday, Friday      STOP taking these medications     metoprolol (TOPROL-XL) 100 MG 24 hr tablet      nitroGLYCERIN (NITROSTAT) 0.4 MG SL tablet      RYTHMOL SR 425 MG 12 hr capsule        Follow-up Information    Follow up with Hillis Range, MD on 02/21/2011. (11am)    Contact information:   [address],  [address] [place] [address] [phone]          BRING ALL MEDICATIONS WITH YOU TO FOLLOW UP APPOINTMENTS  Time spent with patient to include physician time: 60 minutes Signed: Hillis Range, MD 02/06/2011, 8:36 AM

## 2011-02-06 NOTE — Progress Notes (Signed)
Patient Name: Rose Ball     Patient s/p DCCV yesterday with ERAF.  Remained in afib overnight with rates 80-100.   Doing well this am,  No complaints s/p cardioversion.   LABS: Basic Metabolic Panel:  Basename 02/05/11 1325 02/04/11 0630  NA 140 139  K 5.4* 4.5  CL 102 102  CO2 29 27  GLUCOSE 93 116*  BUN 16 17  CREATININE 0.92 0.91  CALCIUM 9.6 9.3  MG -- --  PHOS -- --     INR 2.36 today.     . cholecalciferol  5,000 Units Oral Daily  . digoxin  0.25 mg Intravenous Daily  . escitalopram  20 mg Oral Daily  . fentaNYL      . lisdexamfetamine  70 mg Oral QAM  . metoprolol succinate  100 mg Oral BID  . midazolam      . midazolam      . potassium chloride  20 mEq Oral BID  . rOPINIRole  1 mg Oral QHS  . simvastatin  20 mg Oral q1800  . sodium chloride  3 mL Intravenous Q12H  . warfarin  5 mg Oral ONCE-1800  . DISCONTD: dofetilide  125 mcg Oral Q12H  . DISCONTD: dofetilide  250 mcg Oral Q12H  . DISCONTD: metoprolol succinate  100 mg Oral Daily     Physical Exam: Filed Vitals:   02/05/11 1405 02/05/11 2000 02/05/11 2100 02/06/11 0500  BP: 125/81  126/87 146/86  Pulse: 105 125 116 102  Temp: 97.3 F (36.3 C)  98.4 F (36.9 C) 98.5 F (36.9 C)  TempSrc: Oral  Oral   Resp: 16  20 18   Height:      Weight:      SpO2: 96%  96% 94%   Tele- afib,  V rates 80s-110s,  GEN- The patient is overweight appearing, alert and oriented x 3 today.   Head- normocephalic, atraumatic Eyes-  Sclera clear, conjunctiva pink Ears- hearing intact Oropharynx- clear Neck- supple, no JVP Lungs- Clear to ausculation bilaterally, normal work of breathing Heart- irregular rate and rhythm, no murmurs, rubs or gallops, PMI not laterally displaced GI- soft, NT, ND, + BS Extremities- no clubbing, cyanosis, or edema MS- no significant deformity or atrophy  Assessment and Plan:  1.  Afib-  Ms Sandford's afib has again proven to be a challenge.  She has failed rhythm  control with tikosyn.  She immediately returned to afib yesterday s/p cardioversion. At this point, I would favor rate control.  She will continue metoprolol 100mg  BID and digoxin 0.125mg  daily.  She will also continue coumadin for stroke prevention. We will meet up in the office in 2-4 weeks to discuss further options.  At this point, I have offered rate control long term vs retrying amiodarone vs referral to [facility] for Converge procedure. She will d/c to home today.  2.  Hyperkalemia- stop K supplementation  3.  HTN- stable  4. HL- stable   Signed, Optometrist, BSN   Co Sign: Hillis Range, MD 02/06/2011 8:24 AM

## 2011-02-16 ENCOUNTER — Ambulatory Visit (INDEPENDENT_AMBULATORY_CARE_PROVIDER_SITE_OTHER): Payer: 59 | Admitting: *Deleted

## 2011-02-16 DIAGNOSIS — I4891 Unspecified atrial fibrillation: Secondary | ICD-10-CM

## 2011-02-16 DIAGNOSIS — Z7901 Long term (current) use of anticoagulants: Secondary | ICD-10-CM

## 2011-02-21 ENCOUNTER — Encounter: Payer: 59 | Admitting: Internal Medicine

## 2011-03-02 ENCOUNTER — Ambulatory Visit (INDEPENDENT_AMBULATORY_CARE_PROVIDER_SITE_OTHER): Payer: 59 | Admitting: *Deleted

## 2011-03-02 DIAGNOSIS — I4891 Unspecified atrial fibrillation: Secondary | ICD-10-CM

## 2011-03-02 DIAGNOSIS — Z7901 Long term (current) use of anticoagulants: Secondary | ICD-10-CM

## 2011-03-21 ENCOUNTER — Ambulatory Visit (INDEPENDENT_AMBULATORY_CARE_PROVIDER_SITE_OTHER): Payer: 59 | Admitting: *Deleted

## 2011-03-21 DIAGNOSIS — I4891 Unspecified atrial fibrillation: Secondary | ICD-10-CM

## 2011-03-21 DIAGNOSIS — Z7901 Long term (current) use of anticoagulants: Secondary | ICD-10-CM

## 2011-03-21 LAB — POCT INR: INR: 3

## 2011-04-18 ENCOUNTER — Other Ambulatory Visit (INDEPENDENT_AMBULATORY_CARE_PROVIDER_SITE_OTHER): Payer: 59 | Admitting: *Deleted

## 2011-04-18 ENCOUNTER — Encounter: Payer: Self-pay | Admitting: Cardiology

## 2011-04-18 ENCOUNTER — Ambulatory Visit (INDEPENDENT_AMBULATORY_CARE_PROVIDER_SITE_OTHER): Payer: 59 | Admitting: *Deleted

## 2011-04-18 ENCOUNTER — Ambulatory Visit (INDEPENDENT_AMBULATORY_CARE_PROVIDER_SITE_OTHER): Payer: 59 | Admitting: Cardiology

## 2011-04-18 VITALS — BP 130/86 | HR 80 | Ht 69.0 in | Wt 274.0 lb

## 2011-04-18 DIAGNOSIS — F909 Attention-deficit hyperactivity disorder, unspecified type: Secondary | ICD-10-CM

## 2011-04-18 DIAGNOSIS — Z7901 Long term (current) use of anticoagulants: Secondary | ICD-10-CM

## 2011-04-18 DIAGNOSIS — F988 Other specified behavioral and emotional disorders with onset usually occurring in childhood and adolescence: Secondary | ICD-10-CM

## 2011-04-18 DIAGNOSIS — E785 Hyperlipidemia, unspecified: Secondary | ICD-10-CM

## 2011-04-18 DIAGNOSIS — I4891 Unspecified atrial fibrillation: Secondary | ICD-10-CM

## 2011-04-18 DIAGNOSIS — E669 Obesity, unspecified: Secondary | ICD-10-CM

## 2011-04-18 LAB — MAGNESIUM: Magnesium: 1.9 mg/dL (ref 1.5–2.5)

## 2011-04-18 LAB — BASIC METABOLIC PANEL
Calcium: 8.7 mg/dL (ref 8.4–10.5)
GFR: 59.28 mL/min — ABNORMAL LOW (ref >60.00)
Glucose, Bld: 107 mg/dL — ABNORMAL HIGH (ref 70–99)
Sodium: 143 mEq/L (ref 135–145)

## 2011-04-18 LAB — POCT INR: INR: 1.7

## 2011-04-18 LAB — PROTIME-INR
INR: 1.6 ratio — ABNORMAL HIGH (ref 0.8–1.0)
Prothrombin Time: 17.4 s — ABNORMAL HIGH (ref 10.2–12.4)

## 2011-04-18 NOTE — Patient Instructions (Signed)
Your physician recommends that you continue on your current medications as directed. Please refer to the Current Medication list given to you today.  Your physician wants you to follow-up in: 3 months You will receive a reminder letter in the mail two months in advance. If you don't receive a letter, please call our office to schedule the follow-up appointment.   Needs follow up appointment soon with Dr Johney Frame

## 2011-04-18 NOTE — Patient Instructions (Signed)
Consistency with leafy green vegetable intake. 

## 2011-04-18 NOTE — Assessment & Plan Note (Signed)
The patient has a history of hypercholesterolemia.  She is on Lipitor.  She is not having any side effects of Lipitor.

## 2011-04-18 NOTE — Assessment & Plan Note (Signed)
The patient has been on long-term of Vyvanse for attention deficit syndrome.  She takes the Adderall only when necessary late in the afternoon if she feels the need of it.

## 2011-04-18 NOTE — Progress Notes (Signed)
Rose Ball Date of Birth:  [birth year] [facility] [address] [place], [place]  [address] [phone]         Fax   [phone]  History of Present Illness: This pleasant 66 year old woman is seen back for a followup visit.  She has a history of atrial fibrillation.  Previously she was having paroxysmal atrial fibrillation but recently the fibrillation has been more persistent.  He has been on long-term Coumadin.  She was cardioverted in September 2012 but stayed in sinus rhythm only a few days and when she returned to the office a week later she was in atrial fibrillation again.  However she stated that while she was in normal sinus rhythm she felt much better.  She stated it was "night and day".  In November 2012 she was admitted for Tikosyn therapy by Dr. Johney Frame.  After Tikosyn loading she underwent attempted direct-current cardioversion but was unable to be cardioverted.  She went right back into atrial fibrillation immediately.  She was discharged on Coumadin and on digoxin and beta blocker for rate control.  She returns today stating that she wants to pursue other options for her atrial fibrillation.  She is not satisfied with rate control as a long-term option because she just feels badly and has no stamina.  Current Outpatient Prescriptions  Medication Sig Dispense Refill  . amphetamine-dextroamphetamine (ADDERALL) 15 MG tablet Take 15 mg by mouth as needed.      Marland Kitchen atorvastatin (LIPITOR) 10 MG tablet Take 10 mg by mouth Daily.       . Cholecalciferol (VITAMIN D PO) Take 5,000 Units by mouth daily.       . digoxin (LANOXIN) 0.125 MG tablet Take 1 tablet (125 mcg total) by mouth daily.  30 tablet  3  . escitalopram (LEXAPRO) 20 MG tablet Take 20 mg by mouth Daily. Lexapro      . metoprolol tartrate (LOPRESSOR) 100 MG tablet Take 1 tablet (100 mg total) by mouth 2 (two) times daily.  60 tablet  3  . rOPINIRole (REQUIP) 1 MG tablet Take 1 mg by mouth at  bedtime.        Marland Kitchen VYVANSE 70 MG capsule Take 70 mg by mouth Daily.       Marland Kitchen warfarin (COUMADIN) 5 MG tablet Take 2.5-5 mg by mouth daily. as directed; 2.5mg  every day except 5 mg on Monday, Wednesday, Friday        Allergies  Allergen Reactions  . Amiodarone     dyspnea    Patient Active Problem List  Diagnoses  . Atrial fibrillation  . Encounter for long-term (current) use of anticoagulants  . Hypertension  . Dyslipidemia  . Attention deficit disorder with hyperactivity syndrome  . Obesity    History  Smoking status  . Never Smoker   Smokeless tobacco  . Not on file    History  Alcohol Use No    Family History  Problem Relation Age of Onset  . Coronary artery disease Father     Review of Systems: Constitutional: no fever chills diaphoresis or fatigue or change in weight.  Head and neck: no hearing loss, no epistaxis, no photophobia or visual disturbance. Respiratory: No cough, shortness of breath or wheezing. Cardiovascular: No chest pain peripheral edema, palpitations. Gastrointestinal: No abdominal distention, no abdominal pain, no change in bowel habits hematochezia or melena. Genitourinary: No dysuria, no frequency, no urgency, no nocturia. Musculoskeletal:No arthralgias, no back pain, no gait disturbance or myalgias. Neurological: No  dizziness, no headaches, no numbness, no seizures, no syncope, no weakness, no tremors. Hematologic: No lymphadenopathy, no easy bruising. Psychiatric: No confusion, no hallucinations, no sleep disturbance.    Physical Exam: Filed Vitals:   04/18/11 1241  BP: 130/86  Pulse: 80   General appearance reveals a large middle-aged woman in no acute distress.Pupils equal and reactive.   Extraocular Movements are full.  There is no scleral icterus.  The mouth and pharynx are normal.  The neck is supple.  The carotids reveal no bruits.  The jugular venous pressure is normal.  The thyroid is not enlarged.  There is no  lymphadenopathy.  The chest is clear to percussion and auscultation. There are no rales or rhonchi. Expansion of the chest is symmetrical.  Heart reveals no murmur gallop rub or click.  The rhythm is irregular.The abdomen is soft and nontender. Bowel sounds are normal. The liver and spleen are not enlarged. There Are no abdominal masses. There are no bruits.  The pedal pulses are good.  There is no phlebitis or edema.  There is no cyanosis or clubbing. Strength is normal and symmetrical in all extremities.  There is no lateralizing weakness.  There are no sensory deficits.    Assessment / Plan:  Continue present medication.  We will refer her back to Dr. Johney Frame to discuss the next step.  Her discharge summary from 02/06/11 had noted possible consideration of referral to [facility] for converge therapy.

## 2011-04-18 NOTE — Assessment & Plan Note (Signed)
The patient has a known history of exogenous obesity.  Since last visit she has lost 2 pounds.

## 2011-04-18 NOTE — Assessment & Plan Note (Signed)
She remains on Coumadin for prophylaxis against TIAs.  She has not had any TIA symptoms.  Her INR today is subtherapeutic.

## 2011-04-27 ENCOUNTER — Telehealth: Payer: Self-pay | Admitting: *Deleted

## 2011-04-27 NOTE — Telephone Encounter (Signed)
Message copied by Burnell Blanks on Fri Apr 27, 2011  3:13 PM ------      Message from: Cassell Clement      Created: Fri Apr 27, 2011 11:06 AM       Agree with plan.

## 2011-04-27 NOTE — Telephone Encounter (Signed)
INR doned day on office visit and advised by coumadin clinic.  Advised of other labs, watch sweets and carbs

## 2011-05-09 ENCOUNTER — Ambulatory Visit (INDEPENDENT_AMBULATORY_CARE_PROVIDER_SITE_OTHER): Payer: 59 | Admitting: *Deleted

## 2011-05-09 DIAGNOSIS — I4891 Unspecified atrial fibrillation: Secondary | ICD-10-CM

## 2011-05-09 DIAGNOSIS — Z7901 Long term (current) use of anticoagulants: Secondary | ICD-10-CM

## 2011-05-17 ENCOUNTER — Ambulatory Visit (INDEPENDENT_AMBULATORY_CARE_PROVIDER_SITE_OTHER): Payer: 59 | Admitting: Internal Medicine

## 2011-05-17 ENCOUNTER — Encounter: Payer: Self-pay | Admitting: Internal Medicine

## 2011-05-17 VITALS — BP 110/82 | HR 85 | Ht 69.0 in | Wt 280.8 lb

## 2011-05-17 DIAGNOSIS — I4891 Unspecified atrial fibrillation: Secondary | ICD-10-CM

## 2011-05-17 DIAGNOSIS — I10 Essential (primary) hypertension: Secondary | ICD-10-CM

## 2011-05-17 DIAGNOSIS — E669 Obesity, unspecified: Secondary | ICD-10-CM

## 2011-05-17 NOTE — Patient Instructions (Signed)
Your physician recommends that you schedule a follow-up appointment in: 3 MONTHS WITH DR Johney Frame  REFER TO [place] FOR SPLIT SLEEP STUDY  REFERRAL TO [facility]

## 2011-05-17 NOTE — Assessment & Plan Note (Signed)
The importance of weight reduction to increase effectiveness of afib ablation was discussed at length today.  Unfortunately, she has no intent to aggressively lose weight at his time. She snores.  I will therefore order sleep study to exclude sleep apnea.

## 2011-05-17 NOTE — Assessment & Plan Note (Signed)
Stable No change required today  

## 2011-05-17 NOTE — Assessment & Plan Note (Signed)
The patient has symptomatic persistent afib refractory to recent cardioversion despite medical therapy with rhythmol and tikosyn.  She was previously taken off of amiodarone for reasons of which she is not clear.  Her LA size is significantly enlarged.   Therapeutic strategies for afib including medicine and ablation were discussed in detail with the patient today.  She is aware that given her refractory persistent afib, anticipated success rates with catheter ablation are on the order of 50-60% and would likely require multiple procedures.  I have advised that she discuss the option of the convergent procedure with Dr Smith Robert at [facility].  Given the refractory nature of her afib, I am concerned that a stand alone endovascular procedure is less likely to be successful and therefore would suggest that we proceed with convergent procedure at this time.

## 2011-05-17 NOTE — Progress Notes (Signed)
Rose Ball is a pleasant 66 y.o. WF patient with a h/o obesity, HTN, and persistent atrial fibrillation who presents today for EP following.  She reports being diagnosed with afib about 10 years ago.  She presented 2004 with afib and converted to sinus rhythm with diltiazem.  She reports increasing frequency and duration of afib subsequently.  She subsequently was placed on amiodarone.  This was stopped though she is not sure why.  She has failed medical therapy with rhythmol.  Recently, she was initiated on tikosyn, but could not be cardioverted to sinus despite this medication.  She is clear that her quality of life is better when in sinus rhythm.  She is quite frustrated by her afib.  Today, she denies symptoms of chest pain, orthopnea, PND, lower extremity edema, dizziness, presyncope, syncope, or neurologic sequela. The patient is tolerating medications without difficulties and is otherwise without complaint today.   Past Medical History  Diagnosis Date  . Hypertension   . Persistent atrial fibrillation     s/p cardioversion Sept 2012; failed  . Morbid obesity   . Attention deficit     on Vyvanse  . Dyslipidemia   . Palpitations   . Hypokalemia   . Chronic anticoagulation   . Depression    Past Surgical History  Procedure Date  . Breast biopsy   . Tonsillectomy   . Cardioversion Sept 2012    failed  . US echocardiography Aug 2012    Normal systolic function with EF 55% and slight LAE at 4.4cm    Current Outpatient Prescriptions  Medication Sig Dispense Refill  . amphetamine-dextroamphetamine (ADDERALL) 15 MG tablet Take 15 mg by mouth as needed.      Marland Kitchen atorvastatin (LIPITOR) 10 MG tablet Take 10 mg by mouth Daily.       . Cholecalciferol (VITAMIN D PO) Take 5,000 Units by mouth daily.       . digoxin (LANOXIN) 0.125 MG tablet Take 1 tablet (125 mcg total) by mouth daily.  30 tablet  3  . escitalopram (LEXAPRO) 20 MG tablet Take 20 mg by mouth Daily. Lexapro      .  metoprolol tartrate (LOPRESSOR) 100 MG tablet Take 1 tablet (100 mg total) by mouth 2 (two) times daily.  60 tablet  3  . rOPINIRole (REQUIP) 1 MG tablet Take 1 mg by mouth at bedtime.        Marland Kitchen VYVANSE 70 MG capsule Take 70 mg by mouth Daily.       Marland Kitchen warfarin (COUMADIN) 5 MG tablet Take 2.5-5 mg by mouth daily. as directed; 2.5mg  every day except 5 mg on Monday, Wednesday, Friday        Allergies  Allergen Reactions  . Amiodarone     dyspnea    History   Social History  . Marital Status: Married    Spouse Name: N/A    Number of Children: N/A  . Years of Education: N/A   Occupational History  . Not on file.   Social History Main Topics  . Smoking status: Never Smoker   . Smokeless tobacco: Not on file  . Alcohol Use: No  . Drug Use: No  . Sexually Active: Not on file   Other Topics Concern  . Not on file   Social History Narrative   Pt lives in [place] alone.  Works at [employer]    Family History  Problem Relation Age of Onset  . Coronary artery disease Father  ROS- All systems are reviewed and negative except as per the HPI above  Physical Exam: Filed Vitals:   05/17/11 1213  BP: 110/82  Pulse: 85  Height: 5\' 9"  (1.753 m)  Weight: 280 lb 12.8 oz (127.37 kg)    GEN- The patient is overweight appearing, alert and oriented x 3 today.   Head- normocephalic, atraumatic Eyes-  Sclera clear, conjunctiva pink Ears- hearing intact Oropharynx- clear Neck- supple, no JVP Lymph- no cervical lymphadenopathy Lungs- Clear to ausculation bilaterally, normal work of breathing Heart- irregular rate and rhythm, no murmurs, rubs or gallops, PMI not laterally displaced GI- soft, NT, ND, + BS Extremities- no clubbing, cyanosis, or edema MS- no significant deformity or atrophy Skin- no rash or lesion Psych- euthymic mood, full affect Neuro- strength and sensation are intact  Assessment and Plan:

## 2011-05-31 ENCOUNTER — Telehealth: Payer: Self-pay | Admitting: Internal Medicine

## 2011-05-31 DIAGNOSIS — I4891 Unspecified atrial fibrillation: Secondary | ICD-10-CM

## 2011-05-31 NOTE — Telephone Encounter (Signed)
lmom for pt that someone will call her tomorrow

## 2011-05-31 NOTE — Telephone Encounter (Signed)
Pt waiting on referral from allred , to talk about ablation procedure another dr was going to do, she doesn't have much info to go on, this is all she knows

## 2011-06-01 NOTE — Telephone Encounter (Signed)
Per Dr. Johney Frame, Ms. Burpee should be referred to Dr. Smith Robert at [facility].

## 2011-06-04 ENCOUNTER — Other Ambulatory Visit: Payer: Self-pay

## 2011-06-04 MED ORDER — DIGOXIN 125 MCG PO TABS: 125.0000 ug | ORAL_TABLET | Freq: Every day | ORAL | Status: DC

## 2011-06-04 MED ORDER — METOPROLOL TARTRATE 100 MG PO TABS: 100.0000 mg | ORAL_TABLET | Freq: Two times a day (BID) | ORAL | Status: DC

## 2011-06-04 NOTE — Telephone Encounter (Signed)
Rose Ball has referral and will get records to [facility]

## 2011-06-06 ENCOUNTER — Ambulatory Visit (INDEPENDENT_AMBULATORY_CARE_PROVIDER_SITE_OTHER): Payer: 59 | Admitting: *Deleted

## 2011-06-06 DIAGNOSIS — Z7901 Long term (current) use of anticoagulants: Secondary | ICD-10-CM

## 2011-06-06 DIAGNOSIS — I4891 Unspecified atrial fibrillation: Secondary | ICD-10-CM

## 2011-06-06 LAB — POCT INR: INR: 2.7

## 2011-06-07 NOTE — Progress Notes (Signed)
UR Completed.   Gaylene Moylan Jane [phone] 06/07/2011  

## 2011-06-11 ENCOUNTER — Ambulatory Visit (HOSPITAL_BASED_OUTPATIENT_CLINIC_OR_DEPARTMENT_OTHER): Payer: 59

## 2011-08-06 ENCOUNTER — Other Ambulatory Visit: Payer: Self-pay | Admitting: Cardiology

## 2011-08-06 NOTE — Telephone Encounter (Signed)
Refilled atorvastatin

## 2011-08-20 ENCOUNTER — Encounter: Payer: Self-pay | Admitting: Internal Medicine

## 2011-08-20 ENCOUNTER — Ambulatory Visit (INDEPENDENT_AMBULATORY_CARE_PROVIDER_SITE_OTHER): Payer: 59 | Admitting: Internal Medicine

## 2011-08-20 ENCOUNTER — Ambulatory Visit (INDEPENDENT_AMBULATORY_CARE_PROVIDER_SITE_OTHER): Payer: 59

## 2011-08-20 VITALS — BP 118/80 | HR 87 | Ht 69.0 in | Wt 285.8 lb

## 2011-08-20 DIAGNOSIS — E669 Obesity, unspecified: Secondary | ICD-10-CM

## 2011-08-20 DIAGNOSIS — Z7901 Long term (current) use of anticoagulants: Secondary | ICD-10-CM

## 2011-08-20 DIAGNOSIS — I4891 Unspecified atrial fibrillation: Secondary | ICD-10-CM

## 2011-08-20 NOTE — Patient Instructions (Signed)
Your physician wants you to follow-up in: 6 months with Dr. Allred. You will receive a reminder letter in the mail two months in advance. If you don't receive a letter, please call our office to schedule the follow-up appointment.  

## 2011-08-20 NOTE — Progress Notes (Signed)
PCP: Cassell Clement, MD, MD Primary Cardiologist:  Dr Patty Sermons  Rose Ball is a 66 y.o. female with a h/o obesity, HTN, and persistent atrial fibrillation who presents today for EP follow up. She has been seen at [facility] by Dr Smith Robert and is scheduled for convergent procedure in August.  [facility] remains symptomatic with afib.   She has failed medical therapy with rhythmol and tikosyn.  She was previously on amiodarone and this was stopped for unclear reasons.  Today, she denies symptoms of chest pain, orthopnea, PND, lower extremity edema, dizziness, presyncope, syncope, or neurologic sequela. The patient is tolerating medications without difficulties and is otherwise without complaint today.   Past Medical History  Diagnosis Date  . Hypertension   . Persistent atrial fibrillation     s/p cardioversion Sept 2012; failed  . Morbid obesity   . Attention deficit     on Vyvanse  . Dyslipidemia   . Palpitations   . Hypokalemia   . Chronic anticoagulation   . Depression    Past Surgical History  Procedure Date  . Breast biopsy   . Tonsillectomy   . Cardioversion Sept 2012    failed  . US echocardiography Aug 2012    Normal systolic function with EF 55% and slight LAE at 4.4cm    Current Outpatient Prescriptions  Medication Sig Dispense Refill  . amphetamine-dextroamphetamine (ADDERALL) 15 MG tablet Take 15 mg by mouth as needed.      Marland Kitchen atorvastatin (LIPITOR) 10 MG tablet TAKE 1 TABLET BY MOUTH ONCE DAILY  30 tablet  PRN  . Cholecalciferol (VITAMIN D PO) Take 5,000 Units by mouth daily.       . digoxin (LANOXIN) 0.125 MG tablet Take 1 tablet (125 mcg total) by mouth daily.  30 tablet  6  . escitalopram (LEXAPRO) 20 MG tablet Take 20 mg by mouth Daily. Lexapro      . metoprolol (LOPRESSOR) 100 MG tablet Take 1 tablet (100 mg total) by mouth 2 (two) times daily.  60 tablet  6  . rOPINIRole (REQUIP) 1 MG tablet Take 1 mg by mouth at bedtime.        Marland Kitchen VYVANSE 70 MG capsule Take 70 mg  by mouth Daily.       Marland Kitchen warfarin (COUMADIN) 5 MG tablet Take 2.5-5 mg by mouth daily. as directed; 2.5mg  every day except 5 mg on Monday, Wednesday, Friday        Allergies  Allergen Reactions  . Amiodarone     dyspnea    History   Social History  . Marital Status: Married    Spouse Name: N/A    Number of Children: N/A  . Years of Education: N/A   Occupational History  . Not on file.   Social History Main Topics  . Smoking status: Never Smoker   . Smokeless tobacco: Not on file  . Alcohol Use: No  . Drug Use: No  . Sexually Active: Not on file   Other Topics Concern  . Not on file   Social History Narrative   Pt lives in [place] alone.  Works at [employer]    Family History  Problem Relation Age of Onset  . Coronary artery disease Father      Physical Exam: Filed Vitals:   08/20/11 1221  BP: 118/80  Pulse: 87  Height: 5\' 9"  (1.753 m)  Weight: 285 lb 12.8 oz (129.638 kg)    GEN- The patient is overweight appearing, alert and  oriented x 3 today.   Head- normocephalic, atraumatic Eyes-  Sclera clear, conjunctiva pink Ears- hearing intact Oropharynx- clear Neck- supple, no JVP Lymph- no cervical lymphadenopathy Lungs- Clear to ausculation bilaterally, normal work of breathing Heart- irregular rate and rhythm, no murmurs, rubs or gallops, PMI not laterally displaced GI- soft, NT, ND, + BS Extremities- no clubbing, cyanosis, or edema  ekg today reveals afib, V rate 87 bpm, poor R wave progression  Assessment and Plan:

## 2011-08-20 NOTE — Assessment & Plan Note (Signed)
The importance of weight reduction to increase effectiveness of afib ablation was discussed at length today.  Unfortunately, she has no intent to aggressively lose weight at his time. She snores.  I have ordered a sleep study previously and she did not keep the appointment.  She is not willing to have a sleep study at this time.  She may be willing to reconsider after her ablation.

## 2011-08-20 NOTE — Assessment & Plan Note (Signed)
Convergent procedure planned for August Continue current medicines in the interim. I will see her in 6 months to follow-up on results of the procedure  Goal INR 2-3

## 2011-09-01 HISTORY — PX: OTHER SURGICAL HISTORY: SHX169

## 2011-09-04 NOTE — Progress Notes (Signed)
Addended by: Reine Just on: 09/04/2011 08:22 PM   Modules accepted: Orders

## 2011-09-13 ENCOUNTER — Encounter: Payer: Self-pay | Admitting: Cardiology

## 2011-09-21 ENCOUNTER — Ambulatory Visit (INDEPENDENT_AMBULATORY_CARE_PROVIDER_SITE_OTHER): Payer: 59 | Admitting: *Deleted

## 2011-09-21 DIAGNOSIS — Z7901 Long term (current) use of anticoagulants: Secondary | ICD-10-CM

## 2011-09-21 DIAGNOSIS — I4891 Unspecified atrial fibrillation: Secondary | ICD-10-CM

## 2011-10-03 ENCOUNTER — Ambulatory Visit (INDEPENDENT_AMBULATORY_CARE_PROVIDER_SITE_OTHER): Payer: 59 | Admitting: Pharmacist

## 2011-10-03 DIAGNOSIS — Z7901 Long term (current) use of anticoagulants: Secondary | ICD-10-CM

## 2011-10-03 DIAGNOSIS — I4891 Unspecified atrial fibrillation: Secondary | ICD-10-CM

## 2011-10-24 ENCOUNTER — Ambulatory Visit (INDEPENDENT_AMBULATORY_CARE_PROVIDER_SITE_OTHER): Payer: 59 | Admitting: *Deleted

## 2011-10-24 DIAGNOSIS — Z7901 Long term (current) use of anticoagulants: Secondary | ICD-10-CM

## 2011-10-24 DIAGNOSIS — I4891 Unspecified atrial fibrillation: Secondary | ICD-10-CM

## 2011-10-24 LAB — POCT INR: INR: 2.3

## 2011-11-13 HISTORY — PX: OTHER SURGICAL HISTORY: SHX169

## 2011-11-23 ENCOUNTER — Ambulatory Visit (INDEPENDENT_AMBULATORY_CARE_PROVIDER_SITE_OTHER): Payer: 59 | Admitting: Pharmacist

## 2011-11-23 DIAGNOSIS — I4891 Unspecified atrial fibrillation: Secondary | ICD-10-CM

## 2011-11-23 DIAGNOSIS — Z7901 Long term (current) use of anticoagulants: Secondary | ICD-10-CM

## 2011-11-23 LAB — POCT INR: INR: 1.7

## 2011-12-05 ENCOUNTER — Ambulatory Visit (INDEPENDENT_AMBULATORY_CARE_PROVIDER_SITE_OTHER): Payer: 59 | Admitting: *Deleted

## 2011-12-05 DIAGNOSIS — Z7901 Long term (current) use of anticoagulants: Secondary | ICD-10-CM

## 2011-12-05 DIAGNOSIS — I4891 Unspecified atrial fibrillation: Secondary | ICD-10-CM

## 2011-12-19 ENCOUNTER — Ambulatory Visit (INDEPENDENT_AMBULATORY_CARE_PROVIDER_SITE_OTHER): Payer: 59 | Admitting: Pharmacist

## 2011-12-19 DIAGNOSIS — Z7901 Long term (current) use of anticoagulants: Secondary | ICD-10-CM

## 2011-12-19 DIAGNOSIS — I4891 Unspecified atrial fibrillation: Secondary | ICD-10-CM

## 2011-12-19 LAB — POCT INR: INR: 5

## 2012-01-02 ENCOUNTER — Ambulatory Visit (INDEPENDENT_AMBULATORY_CARE_PROVIDER_SITE_OTHER): Payer: 59 | Admitting: Pharmacist

## 2012-01-02 DIAGNOSIS — I4891 Unspecified atrial fibrillation: Secondary | ICD-10-CM

## 2012-01-02 DIAGNOSIS — Z7901 Long term (current) use of anticoagulants: Secondary | ICD-10-CM

## 2012-01-02 LAB — POCT INR: INR: 2.8

## 2012-01-23 ENCOUNTER — Ambulatory Visit (INDEPENDENT_AMBULATORY_CARE_PROVIDER_SITE_OTHER): Payer: 59 | Admitting: Pharmacist

## 2012-01-23 DIAGNOSIS — Z7901 Long term (current) use of anticoagulants: Secondary | ICD-10-CM

## 2012-01-23 DIAGNOSIS — I4891 Unspecified atrial fibrillation: Secondary | ICD-10-CM

## 2012-01-23 LAB — POCT INR: INR: 1.3

## 2012-02-01 ENCOUNTER — Ambulatory Visit (INDEPENDENT_AMBULATORY_CARE_PROVIDER_SITE_OTHER): Payer: 59 | Admitting: *Deleted

## 2012-02-01 DIAGNOSIS — Z7901 Long term (current) use of anticoagulants: Secondary | ICD-10-CM

## 2012-02-01 DIAGNOSIS — I4891 Unspecified atrial fibrillation: Secondary | ICD-10-CM

## 2012-02-08 ENCOUNTER — Telehealth: Payer: Self-pay | Admitting: Cardiology

## 2012-02-08 MED ORDER — ATORVASTATIN CALCIUM 10 MG PO TABS: 10.0000 mg | ORAL_TABLET | Freq: Every day | ORAL | Status: DC

## 2012-02-08 MED ORDER — WARFARIN SODIUM 5 MG PO TABS: 5.0000 mg | ORAL_TABLET | ORAL | Status: DC

## 2012-02-08 NOTE — Telephone Encounter (Signed)
New Problem:   Refill: Patient called in needing a refill of her atorvastatin (LIPITOR) 10 MG tablet and [facility] [place].   Coumadin: Patient called in needing a refill of her warfarin (COUMADIN) 5 MG tablet filled at [facility] in [place].

## 2012-02-14 ENCOUNTER — Ambulatory Visit (INDEPENDENT_AMBULATORY_CARE_PROVIDER_SITE_OTHER): Payer: 59

## 2012-02-14 DIAGNOSIS — Z7901 Long term (current) use of anticoagulants: Secondary | ICD-10-CM

## 2012-02-14 DIAGNOSIS — I4891 Unspecified atrial fibrillation: Secondary | ICD-10-CM

## 2012-02-14 LAB — POCT INR: INR: 1.7

## 2012-02-22 ENCOUNTER — Ambulatory Visit (INDEPENDENT_AMBULATORY_CARE_PROVIDER_SITE_OTHER): Payer: 59 | Admitting: Internal Medicine

## 2012-02-22 VITALS — BP 158/84 | HR 92 | Ht 69.0 in | Wt 258.8 lb

## 2012-02-22 DIAGNOSIS — I10 Essential (primary) hypertension: Secondary | ICD-10-CM

## 2012-02-22 DIAGNOSIS — I4891 Unspecified atrial fibrillation: Secondary | ICD-10-CM

## 2012-02-22 LAB — BASIC METABOLIC PANEL
Chloride: 103 mEq/L (ref 96–112)
Creatinine, Ser: 1 mg/dL (ref 0.4–1.2)
Potassium: 4.5 mEq/L (ref 3.5–5.1)

## 2012-02-22 LAB — MAGNESIUM: Magnesium: 2.1 mg/dL (ref 1.5–2.5)

## 2012-02-22 MED ORDER — LISINOPRIL 10 MG PO TABS: 10.0000 mg | ORAL_TABLET | Freq: Every day | ORAL | Status: DC

## 2012-02-22 NOTE — Patient Instructions (Addendum)
Your physician wants you to follow-up in: 6 months with Dr Jacquiline Doe will receive a reminder letter in the mail two months in advance. If you don't receive a letter, please call our office to schedule the follow-up appointment.  Your physician recommends that you return for lab work today  BMP/MAG  Your physician has recommended you make the following change in your medication:  1) Increase Lisinopril to 10mg  daily    Your physician has recommended that you have a sleep study. This test records several body functions during sleep, including: brain activity, eye movement, oxygen and carbon dioxide blood levels, heart rate and rhythm, breathing rate and rhythm, the flow of air through your mouth and nose, snoring, body muscle movements, and chest and belly movement.

## 2012-02-24 ENCOUNTER — Encounter: Payer: Self-pay | Admitting: Internal Medicine

## 2012-02-24 NOTE — Assessment & Plan Note (Signed)
Maintaining sinus rhythm No changes today 

## 2012-02-24 NOTE — Assessment & Plan Note (Signed)
Above goal Increase ace inhibitor bmet today Will need follow-up with PCP in 4-6 weeks

## 2012-02-24 NOTE — Progress Notes (Signed)
PCP: Cassell Clement, MD  Rose Ball is a 66 y.o. female who presents today for routine electrophysiology followup.  Since last being seen in our clinic, the patient reports doing very well.  She underwent convergent procedure at [facility] 11/13/11.  She reports good control of her afib at this time. Today, she denies symptoms of palpitations, chest pain, shortness of breath,  lower extremity edema, dizziness, presyncope, or syncope.  The patient is otherwise without complaint today.   Past Medical History  Diagnosis Date  . Hypertension   . Persistent atrial fibrillation     s/p cardioversion Sept 2012; failed  . Morbid obesity   . Attention deficit     on Vyvanse  . Dyslipidemia   . Palpitations   . Hypokalemia   . Chronic anticoagulation   . Depression    Past Surgical History  Procedure Date  . Breast biopsy   . Tonsillectomy   . Cardioversion Sept 2012    failed  . US echocardiography Aug 2012    Normal systolic function with EF 55% and slight LAE at 4.4cm  . Convergent procedure 11/13/11    afib ablation at [facility]    Current Outpatient Prescriptions  Medication Sig Dispense Refill  . amphetamine-dextroamphetamine (ADDERALL) 15 MG tablet Take 15 mg by mouth as needed.      Marland Kitchen atorvastatin (LIPITOR) 10 MG tablet Take 1 tablet (10 mg total) by mouth daily.  30 tablet  PRN  . Cholecalciferol (VITAMIN D PO) Take 5,000 Units by mouth daily.       Marland Kitchen dofetilide (TIKOSYN) 250 MCG capsule Take 250 mcg by mouth 2 (two) times daily.      Marland Kitchen escitalopram (LEXAPRO) 20 MG tablet Take 20 mg by mouth Daily. Lexapro      . furosemide (LASIX) 20 MG tablet Take 40 mg by mouth daily.      Marland Kitchen lisinopril (PRINIVIL,ZESTRIL) 10 MG tablet Take 1 tablet (10 mg total) by mouth daily.  90 tablet  3  . rOPINIRole (REQUIP) 1 MG tablet Take 1 mg by mouth at bedtime.        Marland Kitchen VYVANSE 70 MG capsule Take 70 mg by mouth Daily.       Marland Kitchen warfarin (COUMADIN) 5 MG tablet Take 1 tablet (5 mg total) by mouth as  directed. as directed; 2.5mg  every day except 5 mg on Monday, Wednesday, Friday  30 tablet  3  . amLODipine (NORVASC) 5 MG tablet Take 5 mg by mouth 2 (two) times daily.        Physical Exam: Filed Vitals:   02/22/12 1224  BP: 158/84  Pulse: 92  Height: 5\' 9"  (1.753 m)  Weight: 258 lb 12.8 oz (117.391 kg)  SpO2: 93%    GEN- The patient is well appearing, alert and oriented x 3 today.   Head- normocephalic, atraumatic Eyes-  Sclera clear, conjunctiva pink Ears- hearing intact Oropharynx- clear Lungs- Clear to ausculation bilaterally, normal work of breathing Heart- Regular rate and rhythm, no murmurs, rubs or gallops, PMI not laterally displaced GI- soft, NT, ND, + BS Extremities- no clubbing, cyanosis, or edema  ekg today reveals sinus rhythm 78 bpm, PR 194, QTc 494, otherwise normal ekg  Assessment and Plan:

## 2012-03-04 ENCOUNTER — Ambulatory Visit (INDEPENDENT_AMBULATORY_CARE_PROVIDER_SITE_OTHER): Payer: 59 | Admitting: *Deleted

## 2012-03-04 DIAGNOSIS — Z7901 Long term (current) use of anticoagulants: Secondary | ICD-10-CM

## 2012-03-04 DIAGNOSIS — I4891 Unspecified atrial fibrillation: Secondary | ICD-10-CM

## 2012-03-04 LAB — POCT INR: INR: 2.1

## 2012-03-19 ENCOUNTER — Other Ambulatory Visit: Payer: Self-pay

## 2012-03-19 MED ORDER — ATORVASTATIN CALCIUM 10 MG PO TABS: 10.0000 mg | ORAL_TABLET | Freq: Every day | ORAL | Status: DC

## 2012-03-21 ENCOUNTER — Ambulatory Visit (INDEPENDENT_AMBULATORY_CARE_PROVIDER_SITE_OTHER): Payer: 59 | Admitting: Pharmacist

## 2012-03-21 DIAGNOSIS — I4891 Unspecified atrial fibrillation: Secondary | ICD-10-CM

## 2012-03-21 DIAGNOSIS — Z7901 Long term (current) use of anticoagulants: Secondary | ICD-10-CM

## 2012-03-21 LAB — POCT INR: INR: 1.5

## 2012-04-04 ENCOUNTER — Ambulatory Visit (INDEPENDENT_AMBULATORY_CARE_PROVIDER_SITE_OTHER): Payer: 59 | Admitting: *Deleted

## 2012-04-04 DIAGNOSIS — Z7901 Long term (current) use of anticoagulants: Secondary | ICD-10-CM

## 2012-04-04 DIAGNOSIS — I4891 Unspecified atrial fibrillation: Secondary | ICD-10-CM

## 2012-04-04 LAB — POCT INR: INR: 3

## 2012-04-11 ENCOUNTER — Encounter (HOSPITAL_BASED_OUTPATIENT_CLINIC_OR_DEPARTMENT_OTHER): Payer: 59

## 2012-04-29 ENCOUNTER — Ambulatory Visit (HOSPITAL_COMMUNITY)
Admit: 2012-04-29 | Discharge: 2012-04-29 | Disposition: A | Discharge status: Home / Self Care | Payer: 59 | Attending: Emergency Medicine | Admitting: Emergency Medicine

## 2012-04-29 ENCOUNTER — Encounter (HOSPITAL_COMMUNITY): Payer: Self-pay | Admitting: *Deleted

## 2012-04-29 ENCOUNTER — Emergency Department (INDEPENDENT_AMBULATORY_CARE_PROVIDER_SITE_OTHER)
Admission: EM | Admit: 2012-04-29 | Discharge: 2012-04-29 | Disposition: A | Discharge status: Home / Self Care | Payer: 59 | Source: Home / Self Care | Attending: Emergency Medicine | Admitting: Emergency Medicine

## 2012-04-29 DIAGNOSIS — M79606 Pain in leg, unspecified: Secondary | ICD-10-CM

## 2012-04-29 DIAGNOSIS — I789 Disease of capillaries, unspecified: Secondary | ICD-10-CM

## 2012-04-29 DIAGNOSIS — M7989 Other specified soft tissue disorders: Secondary | ICD-10-CM

## 2012-04-29 DIAGNOSIS — M25569 Pain in unspecified knee: Secondary | ICD-10-CM

## 2012-04-29 DIAGNOSIS — I4891 Unspecified atrial fibrillation: Secondary | ICD-10-CM

## 2012-04-29 DIAGNOSIS — Z7901 Long term (current) use of anticoagulants: Secondary | ICD-10-CM

## 2012-04-29 DIAGNOSIS — M79609 Pain in unspecified limb: Secondary | ICD-10-CM

## 2012-04-29 DIAGNOSIS — I781 Nevus, non-neoplastic: Secondary | ICD-10-CM

## 2012-04-29 NOTE — ED Provider Notes (Addendum)
And History     CSN: [id number]  Arrival date & time 04/29/12  1600   First MD Initiated Contact with Patient 04/29/12 1618      Chief Complaint  Patient presents with  . Leg Swelling    (Consider location/radiation/quality/duration/timing/severity/associated sxs/prior treatment) HPI Comments: Patient presents to urgent care and this afternoon concerned that she might have a blood clot on her right lower but appeared she described as about 10 days ago she started expressing pain to the anterior lateral aspect of her right leg that since then has been swollen and hurting when she walks or puts weight on her right leg. She also describes discomfort pain with walking and when she tries to extend her knee. The pain has extended to the posterior aspect of her right knee and posterior aspect of her right lower extremity. She has been seen at a urgent care facility in [facility] a few days ago she had x-rays were reported as normal. At that time her provider disorder have recommended that she goes to the emergency department to get a Doppler study to rule out a lower extremity blood clot. Patient has been on Coumadin for atrial fibrillation. Incidentally she is having her INR checked tomorrow with the cardiologist office. Patient denies any recent trauma injury or changes in physical exertion. Pain is exacerbated by active movement and walking.  " To tell you the truth I was not really impressed with his exam", he told her to go to an emergency department to get up Doppler study.  Patient is a 66 y.o. female presenting with leg pain. The history is provided by the patient.  Leg Pain  The incident occurred more than 1 week ago. The incident occurred at home. There was no injury mechanism. The pain is present in the left thigh, left knee and left leg. The quality of the pain is described as aching. The pain is at a severity of 6/10. The pain is moderate. The pain has been constant since onset. Associated  symptoms include loss of motion. Pertinent negatives include no numbness, no inability to bear weight, no muscle weakness, no loss of sensation and no tingling. The treatment provided no relief.    Past Medical History  Diagnosis Date  . Hypertension   . Persistent atrial fibrillation     s/p cardioversion Sept 2012; failed  . Morbid obesity   . Attention deficit     on Vyvanse  . Dyslipidemia   . Palpitations   . Hypokalemia   . Chronic anticoagulation   . Depression     Past Surgical History  Procedure Date  . Breast biopsy   . Tonsillectomy   . Cardioversion Sept 2012    failed  . US echocardiography Aug 2012    Normal systolic function with EF 55% and slight LAE at 4.4cm  . Convergent procedure 11/13/11    afib ablation at [facility]    Family History  Problem Relation Age of Onset  . Coronary artery disease Father     History  Substance Use Topics  . Smoking status: Never Smoker   . Smokeless tobacco: Not on file  . Alcohol Use: No    OB History    Grav Para Term Preterm Abortions TAB SAB Ect Mult Living                  Review of Systems  Constitutional: Positive for activity change. Negative for fever, chills, diaphoresis, appetite change and fatigue.  Respiratory: Negative  for shortness of breath.   Cardiovascular: Positive for leg swelling. Negative for chest pain and palpitations.  Skin: Negative for wound.  Neurological: Negative for tingling, weakness and numbness.  Hematological: Bruises/bleeds easily.    Allergies  Amiodarone  Home Medications   Current Outpatient Rx  Name  Route  Sig  Dispense  Refill  . AMLODIPINE BESYLATE 5 MG PO TABS   Oral   Take 5 mg by mouth 2 (two) times daily.         . AMPHETAMINE-DEXTROAMPHETAMINE 15 MG PO TABS   Oral   Take 15 mg by mouth as needed.         . ATORVASTATIN CALCIUM 10 MG PO TABS   Oral   Take 1 tablet (10 mg total) by mouth daily.   30 tablet   6   . VITAMIN D PO   Oral   Take 5,000  Units by mouth daily.          . DOFETILIDE 250 MCG PO CAPS   Oral   Take 250 mcg by mouth 2 (two) times daily.         Marland Kitchen ESCITALOPRAM OXALATE 20 MG PO TABS   Oral   Take 20 mg by mouth Daily. Lexapro         . FUROSEMIDE 20 MG PO TABS   Oral   Take 40 mg by mouth daily.         Marland Kitchen LISINOPRIL 10 MG PO TABS   Oral   Take 1 tablet (10 mg total) by mouth daily.   90 tablet   3   . ROPINIROLE HCL 1 MG PO TABS   Oral   Take 1 mg by mouth at bedtime.           Marland Kitchen VYVANSE 70 MG PO CAPS   Oral   Take 70 mg by mouth Daily.          . WARFARIN SODIUM 5 MG PO TABS   Oral   Take 1 tablet (5 mg total) by mouth as directed. as directed; 2.5mg  every day except 5 mg on Monday, Wednesday, Friday   30 tablet   3     BP 179/84  Pulse 94  Temp 98.7 F (37.1 C) (Oral)  Resp 18  SpO2 97%  Physical Exam  Nursing note and vitals reviewed. Constitutional: She appears well-developed and well-nourished.  Non-toxic appearance. She does not have a sickly appearance. She does not appear ill. No distress.  Musculoskeletal: She exhibits tenderness.       Right knee: She exhibits swelling, effusion and ecchymosis. She exhibits no deformity, no laceration, no erythema, normal alignment, no LCL laxity, normal patellar mobility, no bony tenderness, normal meniscus and no MCL laxity. tenderness found. Lateral joint line and LCL tenderness noted.       Legs: Neurological: She is alert.  Skin: No rash noted. No pallor.    ED Course  Procedures (including critical care time)   Labs Reviewed  PROTIME-INR   No results found.   1. Leg pain, lateral   2. Knee joint pain   3. Telangiectasis       MDM  Problem #1 right lower extremity swelling. Physical exam was consistent with a muscle skeletal source most likely an internal or intra-articular knee condition. Have referred patient to see an orthopedic Dr. for further evaluation.  Problem #2 subtherapeutic INR, patient to discuss  this tomorrow with her cardiologist as scheduled. As patient is being consider  candidate to discontinue anticoagulation.         Jimmie Molly, MD 04/29/12 1701  Jimmie Molly, MD 04/29/12 1819  Jimmie Molly, MD 04/29/12 Zollie Pee

## 2012-04-29 NOTE — Progress Notes (Signed)
VASCULAR LAB PRELIMINARY  PRELIMINARY  PRELIMINARY  PRELIMINARY  Right lower extremity venous duplex completed.    Preliminary report:  Right:  No evidence of DVT, superficial thrombosis, or Baker's cyst.  Camil Wilhelmsen, RVT 04/29/2012, 5:45 PM

## 2012-04-29 NOTE — ED Notes (Signed)
Pt  Reports   Pain   And  Swelling  r  Leg           Knee   Area      She   denys   Any      Recent  Injury      She  [facility]  Had  X  Rays      Which  She  States    Were  Negative     She  Reports  She  Was  Advised  To   Possibly  Have  An  Ultrasound        She  Reports  She  Takes  Coumadin  For A  Fib

## 2012-05-02 ENCOUNTER — Ambulatory Visit (INDEPENDENT_AMBULATORY_CARE_PROVIDER_SITE_OTHER): Payer: 59 | Admitting: *Deleted

## 2012-05-02 DIAGNOSIS — I4891 Unspecified atrial fibrillation: Secondary | ICD-10-CM

## 2012-05-02 DIAGNOSIS — Z7901 Long term (current) use of anticoagulants: Secondary | ICD-10-CM

## 2012-05-03 IMAGING — CR DG CHEST 2V
2 series · 2 of 2 positions shown · non-contrast
Comparison: 04/13/2009

CLINICAL DATA: Atrial fibrillation.  Pre cardioversion

CHEST - 2 VIEW

[w chest pa]
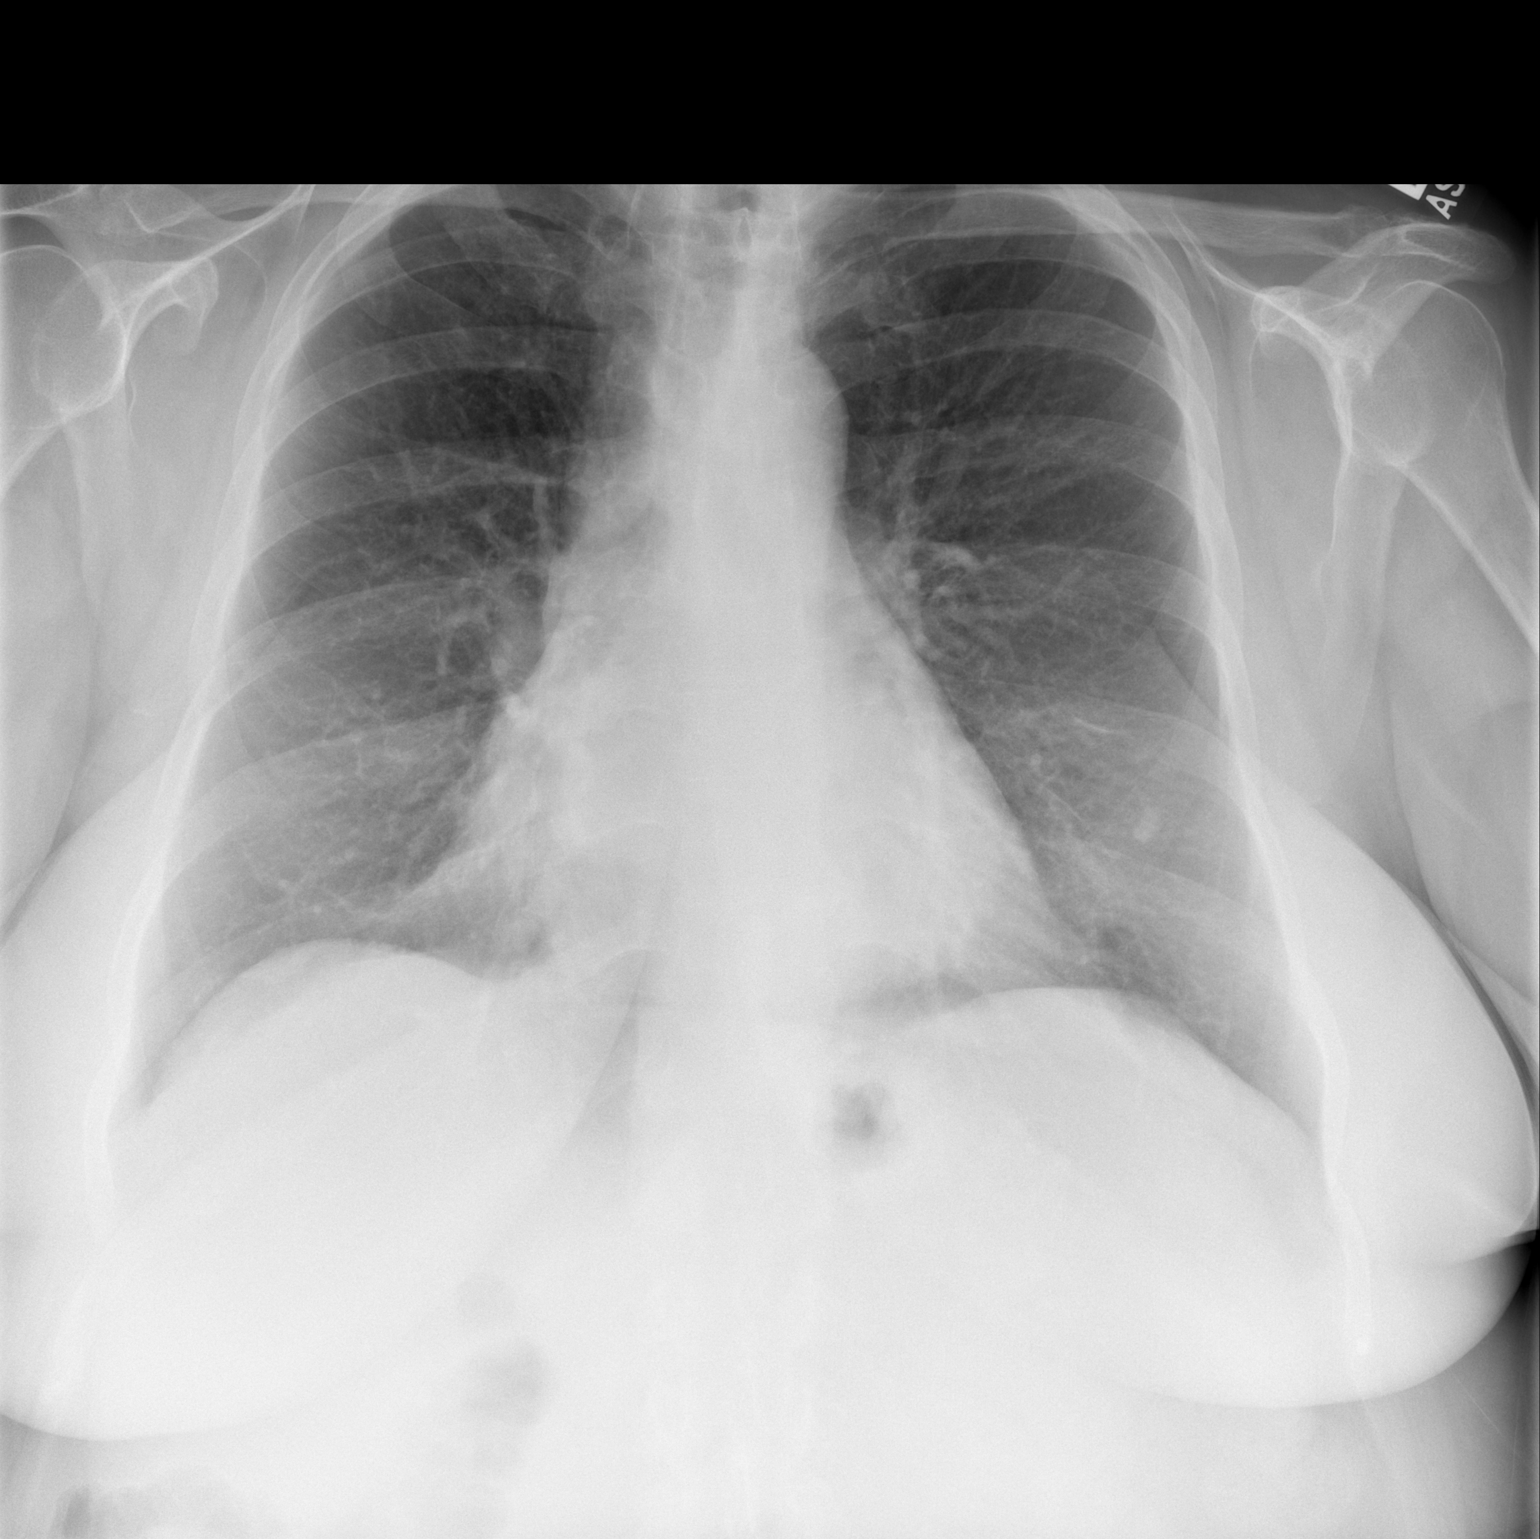

[w chest lat]
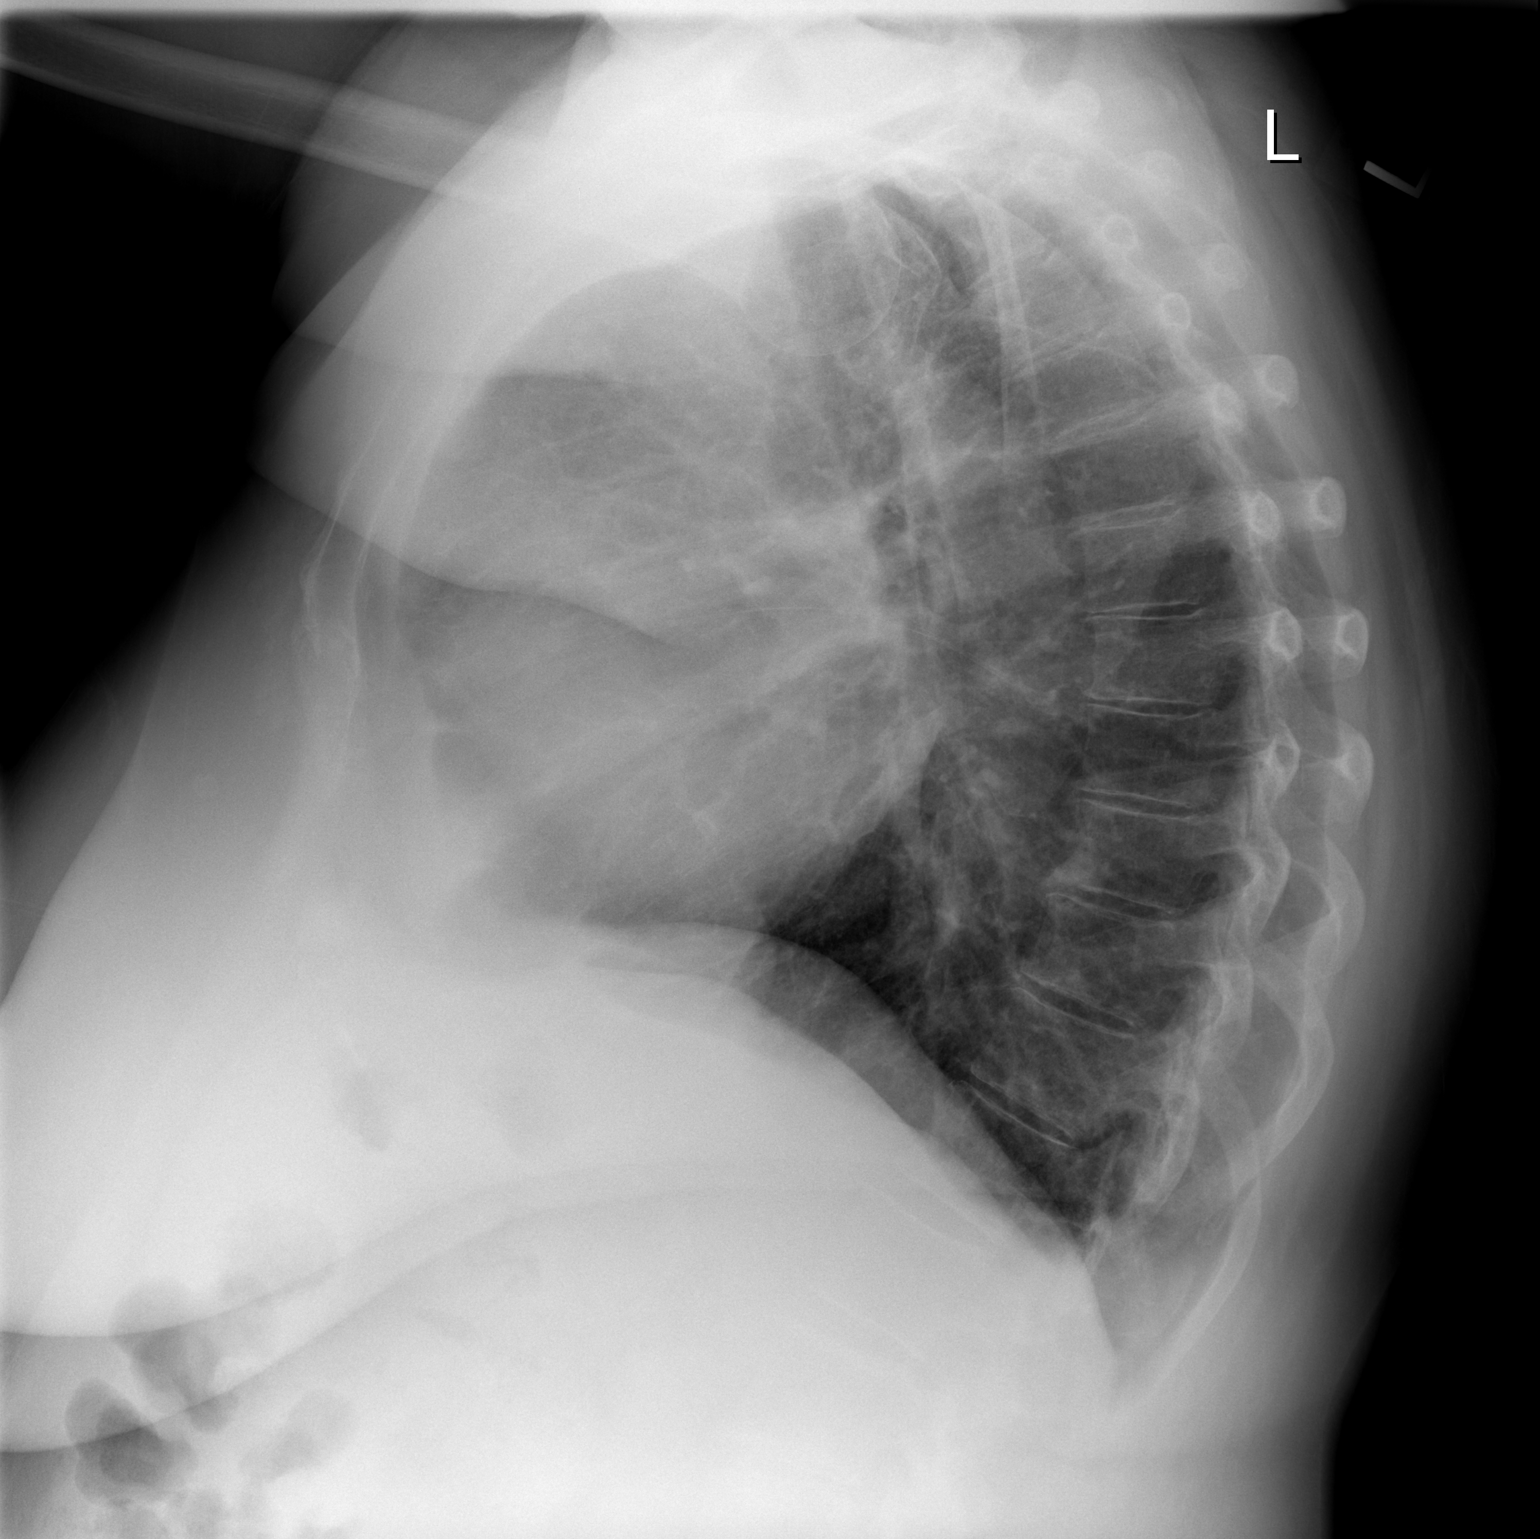

[2 of 2 positions shown; findings below may reference images not displayed]

FINDINGS: Heart size normal.  Vascularity normal.  Negative for
infiltrate or effusion.  Left breast calcification is unchanged
overlying the left lung base.
IMPRESSION: No active cardiopulmonary disease.

## 2012-05-26 ENCOUNTER — Ambulatory Visit (INDEPENDENT_AMBULATORY_CARE_PROVIDER_SITE_OTHER): Payer: 59 | Admitting: Pharmacist

## 2012-05-26 DIAGNOSIS — I4891 Unspecified atrial fibrillation: Secondary | ICD-10-CM

## 2012-05-26 DIAGNOSIS — Z7901 Long term (current) use of anticoagulants: Secondary | ICD-10-CM

## 2012-05-26 LAB — POCT INR: INR: 2.2

## 2012-06-16 ENCOUNTER — Other Ambulatory Visit: Payer: Self-pay | Admitting: *Deleted

## 2012-06-16 MED ORDER — WARFARIN SODIUM 5 MG PO TABS: 5.0000 mg | ORAL_TABLET | ORAL | Status: DC

## 2012-06-27 ENCOUNTER — Ambulatory Visit (INDEPENDENT_AMBULATORY_CARE_PROVIDER_SITE_OTHER): Payer: 59

## 2012-06-27 DIAGNOSIS — Z7901 Long term (current) use of anticoagulants: Secondary | ICD-10-CM

## 2012-06-27 DIAGNOSIS — I4891 Unspecified atrial fibrillation: Secondary | ICD-10-CM

## 2012-06-27 LAB — POCT INR: INR: 2

## 2012-07-29 ENCOUNTER — Ambulatory Visit (INDEPENDENT_AMBULATORY_CARE_PROVIDER_SITE_OTHER): Payer: 59 | Admitting: *Deleted

## 2012-07-29 DIAGNOSIS — I4891 Unspecified atrial fibrillation: Secondary | ICD-10-CM

## 2012-07-29 DIAGNOSIS — Z7901 Long term (current) use of anticoagulants: Secondary | ICD-10-CM

## 2012-07-29 LAB — POCT INR: INR: 2.3

## 2012-09-02 ENCOUNTER — Telehealth: Payer: Self-pay | Admitting: Internal Medicine

## 2012-09-02 NOTE — Telephone Encounter (Signed)
New Problem  Pt states she has an elevated heart rate of 148. Pt is due for an appt, offered her the first available with the Doctor on July 2 and she declined, she said she did not want to wait that long.

## 2012-09-02 NOTE — Telephone Encounter (Signed)
Will call patient and work in tomorrow with Dr Johney Frame at [facility]

## 2012-09-03 ENCOUNTER — Encounter: Payer: Self-pay | Admitting: Internal Medicine

## 2012-09-03 ENCOUNTER — Encounter: Payer: Self-pay | Admitting: *Deleted

## 2012-09-03 ENCOUNTER — Ambulatory Visit (INDEPENDENT_AMBULATORY_CARE_PROVIDER_SITE_OTHER): Payer: 59 | Admitting: Internal Medicine

## 2012-09-03 ENCOUNTER — Ambulatory Visit (INDEPENDENT_AMBULATORY_CARE_PROVIDER_SITE_OTHER): Payer: 59 | Admitting: Pharmacist

## 2012-09-03 VITALS — BP 105/79 | HR 137 | Ht 69.0 in | Wt 246.8 lb

## 2012-09-03 DIAGNOSIS — Z7901 Long term (current) use of anticoagulants: Secondary | ICD-10-CM

## 2012-09-03 DIAGNOSIS — I4892 Unspecified atrial flutter: Secondary | ICD-10-CM

## 2012-09-03 DIAGNOSIS — I4891 Unspecified atrial fibrillation: Secondary | ICD-10-CM

## 2012-09-03 DIAGNOSIS — I10 Essential (primary) hypertension: Secondary | ICD-10-CM

## 2012-09-03 LAB — POCT INR: INR: 1.8

## 2012-09-03 MED ORDER — DILTIAZEM HCL ER COATED BEADS 180 MG PO CP24: 180.0000 mg | ORAL_CAPSULE | Freq: Every day | ORAL | Status: DC

## 2012-09-03 MED ORDER — FLECAINIDE ACETATE 100 MG PO TABS: 100.0000 mg | ORAL_TABLET | Freq: Two times a day (BID) | ORAL | Status: DC

## 2012-09-03 MED ORDER — DABIGATRAN ETEXILATE MESYLATE 150 MG PO CAPS: 150.0000 mg | ORAL_CAPSULE | Freq: Two times a day (BID) | ORAL | Status: DC

## 2012-09-03 NOTE — Patient Instructions (Addendum)
Your physician recommends that you schedule a follow-up appointment 5 weeks with Dr Johney Frame  Your physician has recommended you make the following change in your medication:  1) Start Diltiazem 180mg  daily 2) Start Flecainide 100mg  bid on 09/08/12 after the [facility]EE Guided Cardioversion 3) Stop Coumadin 4) Start Pradaxa 150mg  bid

## 2012-09-03 NOTE — Progress Notes (Signed)
PCP: Thomas Brackbill, MD  Rose Ball is a 67 y.o. female who presents today for urgent electrophysiology followup.  She underwent convergent procedure at [facility] 11/13/11.  She reports good control of her afib at this time.  She stopped tikosyn in April.  She now presents with recurrent symptomatic arrhythmia.  She reports abrupt onset of tachypalpitations x 3 days.  She is unaware of triggers or precipitants.  She reports significant decline in exercise tolerance.   Today, she denies symptoms of chest pain, shortness of breath,  lower extremity edema, dizziness, presyncope, or syncope.  The patient is otherwise without complaint today.   Past Medical History  Diagnosis Date  . Hypertension   . Persistent atrial fibrillation     s/p cardioversion Sept 2012; failed  . Morbid obesity   . Attention deficit     on Vyvanse  . Dyslipidemia   . Palpitations   . Hypokalemia   . Chronic anticoagulation   . Depression    Past Surgical History  Procedure Laterality Date  . Breast biopsy    . Tonsillectomy    . Cardioversion  Sept 2012    failed  . Us echocardiography  Aug 2012    Normal systolic function with EF 55% and slight LAE at 4.4cm  . Convergent procedure  11/13/11    afib ablation at [facility]    Current Outpatient Prescriptions  Medication Sig Dispense Refill  . amphetamine-dextroamphetamine (ADDERALL) 15 MG tablet Take 15 mg by mouth as needed.      . atorvastatin (LIPITOR) 10 MG tablet Take 1 tablet (10 mg total) by mouth daily.  30 tablet  6  . Cholecalciferol (VITAMIN D PO) Take 5,000 Units by mouth daily.       . escitalopram (LEXAPRO) 20 MG tablet Take 20 mg by mouth Daily. Lexapro      . lisinopril (PRINIVIL,ZESTRIL) 10 MG tablet Take 1 tablet (10 mg total) by mouth daily.  90 tablet  3  . rOPINIRole (REQUIP) 1 MG tablet Take 1 mg by mouth at bedtime.        . VYVANSE 70 MG capsule Take 70 mg by mouth Daily.       . dabigatran (PRADAXA) 150 MG CAPS Take 1 capsule (150 mg  total) by mouth every 12 (twelve) hours.  60 capsule  11  . diltiazem (CARDIZEM CD) 180 MG 24 hr capsule Take 1 capsule (180 mg total) by mouth daily.  30 capsule  11  . flecainide (TAMBOCOR) 100 MG tablet Take 1 tablet (100 mg total) by mouth 2 (two) times daily.  180 tablet  3   No current facility-administered medications for this visit.    Physical Exam: Filed Vitals:   09/03/12 1541  BP: 105/79  Pulse: 137  Height: 5' 9" (1.753 m)  Weight: 246 lb 12.8 oz (111.948 kg)    GEN- The patient is well appearing, alert and oriented x 3 today.   Head- normocephalic, atraumatic Eyes-  Sclera clear, conjunctiva pink Ears- hearing intact Oropharynx- clear Lungs- Clear to ausculation bilaterally, normal work of breathing Heart- irregular rate and rhythm, no murmurs, rubs or gallops, PMI not laterally displaced GI- soft, NT, ND, + BS Extremities- no clubbing, cyanosis, or edema  ekg today reveals atrial flutter, V rate 142 bpm, nonspecific ST/T changes  Assessment and Plan:  1. Atrial flutter/ Atrial fibrillation Not completely typical by ekg. Therapeutic strategies for atrial flutter including medicine and ablation were discussed in detail with the patient today.    She would like to avoid additional ablation at this point.  She has an INR <2 today.  I will therefore switch to pradaxa.  We will proceed with TEE guided cardioversion at the next available time. I would favor initiation of flecainide 100mg BID following cardioversion.  I will start diltiazem 180mg daily in the interim for rate control. She will follow-up with me in 5 weeks. She will follow-up at [facility] as scheduled.  2. HTN Stable No change required today    

## 2012-09-04 ENCOUNTER — Other Ambulatory Visit: Payer: Self-pay | Admitting: *Deleted

## 2012-09-04 LAB — CBC WITH DIFFERENTIAL/PLATELET
Basophils Relative: 0.2 % (ref 0.0–3.0)
Eosinophils Absolute: 0.2 10*3/uL (ref 0.0–0.7)
Eosinophils Relative: 2 % (ref 0.0–5.0)
Hemoglobin: 14.1 g/dL (ref 12.0–15.0)
MCHC: 33.1 g/dL (ref 30.0–36.0)
MCV: 83.2 fl (ref 78.0–100.0)
Monocytes Absolute: 0.8 10*3/uL (ref 0.1–1.0)
Neutro Abs: 5.4 10*3/uL (ref 1.4–7.7)
Neutrophils Relative %: 64.9 % (ref 43.0–77.0)
RBC: 5.13 Mil/uL — ABNORMAL HIGH (ref 3.87–5.11)
WBC: 8.3 10*3/uL (ref 4.5–10.5)

## 2012-09-04 LAB — BASIC METABOLIC PANEL
CO2: 29 mEq/L (ref 19–32)
Chloride: 106 mEq/L (ref 96–112)
Potassium: 4.3 mEq/L (ref 3.5–5.1)
Sodium: 141 mEq/L (ref 135–145)

## 2012-09-05 ENCOUNTER — Encounter (HOSPITAL_COMMUNITY): Payer: Self-pay | Admitting: Pharmacy Technician

## 2012-09-08 ENCOUNTER — Ambulatory Visit (HOSPITAL_COMMUNITY): Payer: 59 | Admitting: *Deleted

## 2012-09-08 ENCOUNTER — Encounter (HOSPITAL_COMMUNITY): Payer: Self-pay | Admitting: Gastroenterology

## 2012-09-08 ENCOUNTER — Ambulatory Visit (HOSPITAL_COMMUNITY)
Admission: RE | Admit: 2012-09-08 | Discharge: 2012-09-08 | Disposition: A | Discharge status: Home / Self Care | Payer: 59 | Source: Ambulatory Visit | Attending: Internal Medicine | Admitting: Internal Medicine

## 2012-09-08 ENCOUNTER — Encounter (HOSPITAL_COMMUNITY): Payer: Self-pay | Admitting: *Deleted

## 2012-09-08 ENCOUNTER — Encounter (HOSPITAL_COMMUNITY)
Admission: RE | Disposition: A | Discharge status: Home / Self Care | Payer: Self-pay | Source: Ambulatory Visit | Attending: Internal Medicine

## 2012-09-08 DIAGNOSIS — Z79899 Other long term (current) drug therapy: Secondary | ICD-10-CM | POA: Insufficient documentation

## 2012-09-08 DIAGNOSIS — I10 Essential (primary) hypertension: Secondary | ICD-10-CM | POA: Insufficient documentation

## 2012-09-08 DIAGNOSIS — F3289 Other specified depressive episodes: Secondary | ICD-10-CM | POA: Insufficient documentation

## 2012-09-08 DIAGNOSIS — Z7901 Long term (current) use of anticoagulants: Secondary | ICD-10-CM | POA: Insufficient documentation

## 2012-09-08 DIAGNOSIS — I4891 Unspecified atrial fibrillation: Secondary | ICD-10-CM

## 2012-09-08 DIAGNOSIS — E785 Hyperlipidemia, unspecified: Secondary | ICD-10-CM | POA: Insufficient documentation

## 2012-09-08 DIAGNOSIS — I4892 Unspecified atrial flutter: Secondary | ICD-10-CM | POA: Insufficient documentation

## 2012-09-08 DIAGNOSIS — Z6841 Body Mass Index (BMI) 40.0 and over, adult: Secondary | ICD-10-CM | POA: Insufficient documentation

## 2012-09-08 DIAGNOSIS — F988 Other specified behavioral and emotional disorders with onset usually occurring in childhood and adolescence: Secondary | ICD-10-CM | POA: Insufficient documentation

## 2012-09-08 DIAGNOSIS — F329 Major depressive disorder, single episode, unspecified: Secondary | ICD-10-CM | POA: Insufficient documentation

## 2012-09-08 HISTORY — PX: CARDIOVERSION: SHX1299

## 2012-09-08 HISTORY — PX: TEE WITHOUT CARDIOVERSION: SHX5443

## 2012-09-08 SURGERY — ECHOCARDIOGRAM, TRANSESOPHAGEAL
Anesthesia: General

## 2012-09-08 MED ORDER — LIDOCAINE VISCOUS 2 % MT SOLN
OROMUCOSAL | Status: AC
  Filled 2012-09-08: qty 15

## 2012-09-08 MED ORDER — SODIUM CHLORIDE 0.9 % IV SOLN
INTRAVENOUS | Status: DC
  Administered 2012-09-08: 500 mL via INTRAVENOUS

## 2012-09-08 MED ORDER — MIDAZOLAM HCL 5 MG/ML IJ SOLN
INTRAMUSCULAR | Status: AC
  Filled 2012-09-08: qty 2

## 2012-09-08 MED ORDER — LIDOCAINE VISCOUS 2 % MT SOLN
OROMUCOSAL | Status: DC | PRN
  Administered 2012-09-08: 1 via OROMUCOSAL

## 2012-09-08 MED ORDER — FENTANYL CITRATE 0.05 MG/ML IJ SOLN
INTRAMUSCULAR | Status: DC | PRN
  Administered 2012-09-08: 25 ug via INTRAVENOUS
  Administered 2012-09-08: 12.5 ug via INTRAVENOUS
  Administered 2012-09-08: 25 ug via INTRAVENOUS

## 2012-09-08 MED ORDER — MIDAZOLAM HCL 10 MG/2ML IJ SOLN
INTRAMUSCULAR | Status: DC | PRN
  Administered 2012-09-08 (×3): 2 mg via INTRAVENOUS

## 2012-09-08 MED ORDER — PROPOFOL 10 MG/ML IV BOLUS
INTRAVENOUS | Status: DC | PRN
  Administered 2012-09-08: 20 mg via INTRAVENOUS

## 2012-09-08 MED ORDER — FENTANYL CITRATE 0.05 MG/ML IJ SOLN
INTRAMUSCULAR | Status: AC
  Filled 2012-09-08: qty 2

## 2012-09-08 NOTE — Preoperative (Signed)
Beta Blockers   Reason not to administer Beta Blockers:Not Applicable 

## 2012-09-08 NOTE — H&P (View-Only) (Signed)
PCP: Cassell Clement, MD  Rose Ball is a 67 y.o. female who presents today for urgent electrophysiology followup.  She underwent convergent procedure at [facility] 11/13/11.  She reports good control of her afib at this time.  She stopped tikosyn in April.  She now presents with recurrent symptomatic arrhythmia.  She reports abrupt onset of tachypalpitations x 3 days.  She is unaware of triggers or precipitants.  She reports significant decline in exercise tolerance.   Today, she denies symptoms of chest pain, shortness of breath,  lower extremity edema, dizziness, presyncope, or syncope.  The patient is otherwise without complaint today.   Past Medical History  Diagnosis Date  . Hypertension   . Persistent atrial fibrillation     s/p cardioversion Sept 2012; failed  . Morbid obesity   . Attention deficit     on Vyvanse  . Dyslipidemia   . Palpitations   . Hypokalemia   . Chronic anticoagulation   . Depression    Past Surgical History  Procedure Laterality Date  . Breast biopsy    . Tonsillectomy    . Cardioversion  Sept 2012    failed  . US echocardiography  Aug 2012    Normal systolic function with EF 55% and slight LAE at 4.4cm  . Convergent procedure  11/13/11    afib ablation at [facility]    Current Outpatient Prescriptions  Medication Sig Dispense Refill  . amphetamine-dextroamphetamine (ADDERALL) 15 MG tablet Take 15 mg by mouth as needed.      Marland Kitchen atorvastatin (LIPITOR) 10 MG tablet Take 1 tablet (10 mg total) by mouth daily.  30 tablet  6  . Cholecalciferol (VITAMIN D PO) Take 5,000 Units by mouth daily.       Marland Kitchen escitalopram (LEXAPRO) 20 MG tablet Take 20 mg by mouth Daily. Lexapro      . lisinopril (PRINIVIL,ZESTRIL) 10 MG tablet Take 1 tablet (10 mg total) by mouth daily.  90 tablet  3  . rOPINIRole (REQUIP) 1 MG tablet Take 1 mg by mouth at bedtime.        Marland Kitchen VYVANSE 70 MG capsule Take 70 mg by mouth Daily.       . dabigatran (PRADAXA) 150 MG CAPS Take 1 capsule (150 mg  total) by mouth every 12 (twelve) hours.  60 capsule  11  . diltiazem (CARDIZEM CD) 180 MG 24 hr capsule Take 1 capsule (180 mg total) by mouth daily.  30 capsule  11  . flecainide (TAMBOCOR) 100 MG tablet Take 1 tablet (100 mg total) by mouth 2 (two) times daily.  180 tablet  3   No current facility-administered medications for this visit.    Physical Exam: Filed Vitals:   09/03/12 1541  BP: 105/79  Pulse: 137  Height: 5\' 9"  (1.753 m)  Weight: 246 lb 12.8 oz (111.948 kg)    GEN- The patient is well appearing, alert and oriented x 3 today.   Head- normocephalic, atraumatic Eyes-  Sclera clear, conjunctiva pink Ears- hearing intact Oropharynx- clear Lungs- Clear to ausculation bilaterally, normal work of breathing Heart- irregular rate and rhythm, no murmurs, rubs or gallops, PMI not laterally displaced GI- soft, NT, ND, + BS Extremities- no clubbing, cyanosis, or edema  ekg today reveals atrial flutter, V rate 142 bpm, nonspecific ST/T changes  Assessment and Plan:  1. Atrial flutter/ Atrial fibrillation Not completely typical by ekg. Therapeutic strategies for atrial flutter including medicine and ablation were discussed in detail with the patient today.  She would like to avoid additional ablation at this point.  She has an INR <2 today.  I will therefore switch to pradaxa.  We will proceed with TEE guided cardioversion at the next available time. I would favor initiation of flecainide 100mg  BID following cardioversion.  I will start diltiazem 180mg  daily in the interim for rate control. She will follow-up with me in 5 weeks. She will follow-up at [facility] as scheduled.  2. HTN Stable No change required today

## 2012-09-08 NOTE — Interval H&P Note (Signed)
History and Physical Interval Note:  09/08/2012 10:16 AM  Caryn Bee  has presented today for surgery, with the diagnosis of A FLUTTER   The various methods of treatment have been discussed with the patient and family. After consideration of risks, benefits and other options for treatment, the patient has consented to  Procedure(s): TRANSESOPHAGEAL ECHOCARDIOGRAM (TEE) (N/A) CARDIOVERSION (N/A) as a surgical intervention .  The patient's history has been reviewed, patient examined, no change in status, stable for surgery.  I have reviewed the patient's chart and labs.  Questions were answered to the patient's satisfaction.     Dietrich Pates

## 2012-09-08 NOTE — Op Note (Signed)
LA LAA without massses

## 2012-09-08 NOTE — Op Note (Signed)
Patient anesthetized by anesthesia. WIth pads in AP position patient cardioverted with 200 J synchronized biphasic energy to SB. Procedure without complication.

## 2012-09-08 NOTE — Transfer of Care (Signed)
Immediate Anesthesia Transfer of Care Note  Patient: Rose Ball  Procedure(s) Performed: Procedure(s): TRANSESOPHAGEAL ECHOCARDIOGRAM (TEE) (N/A) CARDIOVERSION (N/A)  Patient Location: PACU and Endoscopy Unit  Anesthesia Type:General  Level of Consciousness: patient cooperative, lethargic and responds to stimulation  Airway & Oxygen Therapy: Patient Spontanous Breathing and Patient connected to nasal cannula oxygen  Post-op Assessment: Report given to PACU RN and Post -op Vital signs reviewed and stable  Post vital signs: Reviewed and stable  Complications: No apparent anesthesia complications

## 2012-09-08 NOTE — Anesthesia Preprocedure Evaluation (Addendum)
Anesthesia Evaluation  Patient identified by MRN, date of birth, ID band Patient awake    Reviewed: Allergy & Precautions, H&P , NPO status , Patient's Chart, lab work & pertinent test results  History of Anesthesia Complications Negative for: history of anesthetic complications  Airway Mallampati: II TM Distance: >3 FB Neck ROM: Full    Dental  (+) Teeth Intact and Dental Advisory Given   Pulmonary neg pulmonary ROS,  breath sounds clear to auscultation  Pulmonary exam normal       Cardiovascular hypertension, Pt. on medications + dysrhythmias (normal LVF by TEE today) Atrial Fibrillation Rhythm:Irregular Rate:Tachycardia     Neuro/Psych PSYCHIATRIC DISORDERS Depression negative neurological ROS     GI/Hepatic negative GI ROS, Neg liver ROS,   Endo/Other  Morbid obesity  Renal/GU negative Renal ROS     Musculoskeletal   Abdominal (+) + obese,   Peds  Hematology   Anesthesia Other Findings   Reproductive/Obstetrics                          Anesthesia Physical Anesthesia Plan  ASA: III  Anesthesia Plan: General   Post-op Pain Management:    Induction: Intravenous  Airway Management Planned: Natural Airway and Mask  Additional Equipment:   Intra-op Plan:   Post-operative Plan:   Informed Consent: I have reviewed the patients History and Physical, chart, labs and discussed the procedure including the risks, benefits and alternatives for the proposed anesthesia with the patient or authorized representative who has indicated his/her understanding and acceptance.     Plan Discussed with: CRNA and Surgeon  Anesthesia Plan Comments: (Plan routine monitors, GA for cardioversion)        Anesthesia Quick Evaluation

## 2012-09-08 NOTE — Progress Notes (Signed)
  Echocardiogram Echocardiogram Transesophageal has been performed.  Rose Ball 09/08/2012, 11:11 AM

## 2012-09-08 NOTE — Anesthesia Postprocedure Evaluation (Signed)
  Anesthesia Post-op Note  Patient: Rose Ball  Procedure(s) Performed: Procedure(s): TRANSESOPHAGEAL ECHOCARDIOGRAM (TEE) (N/A) CARDIOVERSION (N/A)  Patient Location: PACU and Endoscopy Unit  Anesthesia Type:General  Level of Consciousness: patient cooperative and responds to stimulation  Airway and Oxygen Therapy: Patient Spontanous Breathing and Patient connected to nasal cannula oxygen  Post-op Pain: none  Post-op Assessment: Post-op Vital signs reviewed and Patient's Cardiovascular Status Stable  Post-op Vital Signs: Reviewed and stable  Complications: No apparent anesthesia complications

## 2012-09-09 ENCOUNTER — Encounter (HOSPITAL_COMMUNITY): Payer: Self-pay | Admitting: Internal Medicine

## 2012-09-16 ENCOUNTER — Other Ambulatory Visit: Payer: 59

## 2012-09-19 ENCOUNTER — Ambulatory Visit (INDEPENDENT_AMBULATORY_CARE_PROVIDER_SITE_OTHER): Payer: 59 | Admitting: *Deleted

## 2012-09-19 DIAGNOSIS — I10 Essential (primary) hypertension: Secondary | ICD-10-CM

## 2012-09-19 LAB — BASIC METABOLIC PANEL
CO2: 26 mEq/L (ref 19–32)
Calcium: 9.4 mg/dL (ref 8.4–10.5)
Creatinine, Ser: 1.2 mg/dL (ref 0.4–1.2)
Glucose, Bld: 102 mg/dL — ABNORMAL HIGH (ref 70–99)

## 2012-10-08 ENCOUNTER — Encounter: Payer: Self-pay | Admitting: Internal Medicine

## 2012-10-08 ENCOUNTER — Ambulatory Visit (INDEPENDENT_AMBULATORY_CARE_PROVIDER_SITE_OTHER): Payer: Medicare Other | Admitting: Internal Medicine

## 2012-10-08 VITALS — BP 120/84 | HR 85 | Ht 69.0 in | Wt 241.0 lb

## 2012-10-08 DIAGNOSIS — I4891 Unspecified atrial fibrillation: Secondary | ICD-10-CM

## 2012-10-08 DIAGNOSIS — Z79899 Other long term (current) drug therapy: Secondary | ICD-10-CM

## 2012-10-08 DIAGNOSIS — I499 Cardiac arrhythmia, unspecified: Secondary | ICD-10-CM

## 2012-10-08 DIAGNOSIS — I10 Essential (primary) hypertension: Secondary | ICD-10-CM

## 2012-10-08 LAB — BASIC METABOLIC PANEL
BUN: 18 mg/dL (ref 6–23)
Calcium: 9.2 mg/dL (ref 8.4–10.5)
GFR: 48.75 mL/min — ABNORMAL LOW (ref >60.00)
Glucose, Bld: 91 mg/dL (ref 70–99)

## 2012-10-08 NOTE — Progress Notes (Signed)
Primary Cardiologist:  Dr Patty Sermons  Rose Ball is a 67 y.o. female who presents today for routine electrophysiology followup.  Since her recent cardioversion, the patient reports doing very well.  She is unaware of any further afib.  She is tolerating flecainide.  Today, she denies symptoms of palpitations, chest pain, shortness of breath,  lower extremity edema, dizziness, presyncope, or syncope.  The patient is otherwise without complaint today.   Past Medical History  Diagnosis Date  . Hypertension   . Persistent atrial fibrillation     s/p cardioversion Sept 2012; failed  . Morbid obesity   . Attention deficit     on Vyvanse  . Dyslipidemia   . Palpitations   . Hypokalemia   . Chronic anticoagulation   . Depression    Past Surgical History  Procedure Laterality Date  . Breast biopsy    . Tonsillectomy    . Cardioversion  Sept 2012    failed  . US echocardiography  Aug 2012    Normal systolic function with EF 55% and slight LAE at 4.4cm  . Convergent procedure  11/13/11    afib ablation at [facility]  . Loop recorder insertion Left 09/2011  . Tee without cardioversion N/A 09/08/2012    Procedure: TRANSESOPHAGEAL ECHOCARDIOGRAM (TEE);  Surgeon: Pricilla Riffle, MD;  Location: [facility] ENDOSCOPY;  Service: Cardiovascular;  Laterality: N/A;  . Cardioversion N/A 09/08/2012    Procedure: CARDIOVERSION;  Surgeon: Pricilla Riffle, MD;  Location: [facility] ENDOSCOPY;  Service: Cardiovascular;  Laterality: N/A;    Current Outpatient Prescriptions  Medication Sig Dispense Refill  . amphetamine-dextroamphetamine (ADDERALL) 15 MG tablet Take 15 mg by mouth daily as needed (for additional focus).       Marland Kitchen atorvastatin (LIPITOR) 10 MG tablet Take 10 mg by mouth daily.      . dabigatran (PRADAXA) 150 MG CAPS Take 150 mg by mouth every 12 (twelve) hours.      Marland Kitchen diltiazem (CARDIZEM CD) 180 MG 24 hr capsule Take 180 mg by mouth daily.      Marland Kitchen escitalopram (LEXAPRO) 20 MG tablet Take 20 mg by mouth Daily. Lexapro       . flecainide (TAMBOCOR) 100 MG tablet Take 100 mg by mouth 2 (two) times daily.      . naproxen sodium (ANAPROX) 220 MG tablet Take 440 mg by mouth 2 (two) times daily as needed (for arthritis pain).      Marland Kitchen rOPINIRole (REQUIP) 1 MG tablet Take 1 mg by mouth at bedtime.        Marland Kitchen VYVANSE 70 MG capsule Take 70 mg by mouth Daily.        No current facility-administered medications for this visit.    Physical Exam: Filed Vitals:   10/08/12 1429  BP: 120/84  Pulse: 85  Height: 5\' 9"  (1.753 m)  Weight: 241 lb (109.317 kg)    GEN- The patient is well appearing, alert and oriented x 3 today.   Head- normocephalic, atraumatic Eyes-  Sclera clear, conjunctiva pink Ears- hearing intact Oropharynx- clear Lungs- Clear to ausculation bilaterally, normal work of breathing Heart- Regular rate and rhythm, no murmurs, rubs or gallops, PMI not laterally displaced GI- soft, NT, ND, + BS Extremities- no clubbing, cyanosis, or edema  ILR interroation today reveals no afib since 09/08/12 ekg today reveals sinus rhythm with incomplete RBBB  Assessment and Plan:  1. afib Doing well at this time Continue anticoagulation and flecainide gxt to evaluate for exercise induced arrhythmias  2. HTN Stable Lisinopril recently stopped due to abnormal renal function Repeat bmet today  Return in 4 months

## 2012-10-08 NOTE — Patient Instructions (Addendum)
Your physician wants you to follow-up in:   4 MONTHS WITH  DR Jacquiline Doe will receive a reminder letter in the mail two months in advance. If you don't receive a letter, please call our office to schedule the follow-up appointment. Your physician recommends that you continue on your current medications as directed. Please refer to the Current Medication list given to you today. Your physician has requested that you have an exercise tolerance test. For further information please visit [email or web address] Please also follow instruction sheet, as given. Your physician recommends that you return for lab work in: TODAY  BMET

## 2012-10-17 ENCOUNTER — Other Ambulatory Visit: Payer: Self-pay | Admitting: Cardiology

## 2012-10-22 ENCOUNTER — Ambulatory Visit (INDEPENDENT_AMBULATORY_CARE_PROVIDER_SITE_OTHER): Payer: Medicare Other | Admitting: Nurse Practitioner

## 2012-10-22 ENCOUNTER — Encounter: Payer: Self-pay | Admitting: Nurse Practitioner

## 2012-10-22 DIAGNOSIS — I499 Cardiac arrhythmia, unspecified: Secondary | ICD-10-CM

## 2012-10-22 DIAGNOSIS — Z79899 Other long term (current) drug therapy: Secondary | ICD-10-CM

## 2012-10-22 NOTE — Progress Notes (Signed)
Exercise Treadmill Test  Pre-Exercise Testing Evaluation Rhythm: normal sinus  Rate: 75     Test  Exercise Tolerance Test Ordering MD: Hillis Range, MD  Interpreting MD: Norma Fredrickson, NP  Unique Test No: 1  Treadmill:  1  Indication for ETT: A-Fib, Medication Management (Flecainide)  Contraindication to ETT: No   Stress Modality: exercise - treadmill  Cardiac Imaging Performed: non   Protocol: standard Bruce - maximal  Max BP:  195/60  Max MPHR (bpm):  155 85% MPR (bpm):  132  MPHR obtained (bpm):  106 % MPHR obtained:  68%  Reached 85% MPHR (min:sec):  NA Total Exercise Time (min-sec):  5 minutes  Workload in METS:  7.0 Borg Scale: 19  Reason ETT Terminated:  patient's desire to stop    ST Segment Analysis At Rest: normal ST segments - no evidence of significant ST depression With Exercise: no evidence of significant ST depression  Other Information Arrhythmia:  No Angina during ETT:  absent (0) Quality of ETT:  non-diagnostic  ETT Interpretation:  Non-diagnostic - target HR not achieved  Comments: Patient presents today for routine GXT. Has had flecainide resumed for AF. Clinically doing well without complaints.  Today, she exercised on the standard Bruce protocol for a total of 5 minutes. She has poor/reduced exercise tolerance. Adequate BP response. Clinically negative for chest pain. Test limited by fatigue and dyspnea. EKG negative for ischemia. No arrhythmia noted as well. Remained in sinus thru out the study.   Recommendations: Continue with current medicines Follow up with Dr. Johney Frame as planned  Patient is agreeable to this plan and will call if any problems develop in the interim.   Rosalio Macadamia, RN, ANP-C [facility] [address] [place], [place]  [address]

## 2012-10-28 ENCOUNTER — Telehealth: Payer: Self-pay | Admitting: Internal Medicine

## 2012-10-28 NOTE — Telephone Encounter (Signed)
New Prob  Pt states Dr Johney Frame recommend a PCP but she cannot remember who it was. She asked if you could call her back.

## 2012-10-29 NOTE — Telephone Encounter (Signed)
lmom for patient that I did not see in any of his last several office notes as to a PCP.

## 2012-11-05 ENCOUNTER — Other Ambulatory Visit: Payer: Self-pay

## 2013-01-20 ENCOUNTER — Telehealth: Payer: Self-pay | Admitting: Internal Medicine

## 2013-01-20 NOTE — Telephone Encounter (Signed)
Yesterday and today HR is 106 but she states she feels it is in normal rhythm Drinks Coke with caffeine but this no different

## 2013-01-20 NOTE — Telephone Encounter (Signed)
New Problem  Pt states heart rate has increased she asks if she should increase any of her medications to bring heart rate down// please advise

## 2013-01-21 NOTE — Telephone Encounter (Signed)
Spoke with patient and her HR is still around 106.  She is currently taking Diltiazem 180mg  daily and Flecainide 100mg  twice daily.  She is wondering if she would be able to increase one of her medications.  I let her know I would forward to Dr Johney Frame for review

## 2013-01-25 NOTE — Telephone Encounter (Signed)
Please follow up with the patient

## 2013-01-26 NOTE — Telephone Encounter (Signed)
Will increase her Diltiazem to 180mg  bid and follow up later in the week to see how she is doing.  I left her a message to call me

## 2013-01-27 NOTE — Telephone Encounter (Signed)
Follow up ° ° ° ° °Returned Kelly's call °

## 2013-01-29 NOTE — Telephone Encounter (Signed)
Follow Up: ° °Pt states she is returning Kelly's call. °

## 2013-01-29 NOTE — Telephone Encounter (Signed)
Called patient back and left her a message to return my call 

## 2013-01-29 NOTE — Telephone Encounter (Addendum)
She has an appointment already scheduled and is going to keep this  Today has been a good day but she wants to come in and have her ILR interrogated .    She feels

## 2013-01-29 NOTE — Telephone Encounter (Signed)
Follow up ° ° ° ° °Returned Kelly's call °

## 2013-01-30 ENCOUNTER — Ambulatory Visit (INDEPENDENT_AMBULATORY_CARE_PROVIDER_SITE_OTHER): Payer: Medicare Other | Admitting: *Deleted

## 2013-01-30 DIAGNOSIS — Z4509 Encounter for adjustment and management of other cardiac device: Secondary | ICD-10-CM

## 2013-01-30 NOTE — Progress Notes (Signed)
ILR check due to pt c/o of A-flutter/fib; she feels sensations in her chest. ILR shows AT/AF 9.3% of time + pradaxa.   Pt presenting today in A-fib. She has had convergent PVI ablation & past cardioversions.  Pt requesting another cardioversion sooner than next appt w/ Dr. Johney Frame.   ROV w/ Dr. Johney Frame 02/09/13.

## 2013-02-05 ENCOUNTER — Other Ambulatory Visit: Payer: Self-pay

## 2013-02-09 ENCOUNTER — Encounter: Payer: Self-pay | Admitting: Internal Medicine

## 2013-02-09 ENCOUNTER — Ambulatory Visit (INDEPENDENT_AMBULATORY_CARE_PROVIDER_SITE_OTHER): Payer: Medicare Other | Admitting: Internal Medicine

## 2013-02-09 VITALS — BP 140/80 | HR 73 | Ht 69.0 in | Wt 252.0 lb

## 2013-02-09 DIAGNOSIS — Z7901 Long term (current) use of anticoagulants: Secondary | ICD-10-CM

## 2013-02-09 DIAGNOSIS — I10 Essential (primary) hypertension: Secondary | ICD-10-CM

## 2013-02-09 DIAGNOSIS — I4891 Unspecified atrial fibrillation: Secondary | ICD-10-CM

## 2013-02-09 NOTE — Patient Instructions (Signed)
Needs Tikosyn Load  Your physician has recommended you make the following change in your medication:  1) Stop Flecainide ----72 hours for Tikosyn Load

## 2013-02-09 NOTE — Progress Notes (Signed)
Primary Cardiologist:  Dr Patty Sermons  Rose Ball is a 67 y.o. female who presents today for electrophysiology followup.  She recently developed recurrent afib despite medical therapy with flecainide.  She reports palpitations, fatigue, and decreased exercise tolerance.  Today, she denies symptoms of chest pain, shortness of breath,  lower extremity edema, dizziness, presyncope, or syncope.  The patient is otherwise without complaint today.   Past Medical History  Diagnosis Date  . Hypertension   . Persistent atrial fibrillation     s/p cardioversion Sept 2012; failed  . Morbid obesity   . Attention deficit     on Vyvanse  . Dyslipidemia   . Palpitations   . Hypokalemia   . Chronic anticoagulation   . Depression    Past Surgical History  Procedure Laterality Date  . Breast biopsy    . Tonsillectomy    . Cardioversion  Sept 2012    failed  . US echocardiography  Aug 2012    Normal systolic function with EF 55% and slight LAE at 4.4cm  . Convergent procedure  11/13/11    afib ablation at [facility]  . Loop recorder insertion Left 09/2011  . Tee without cardioversion N/A 09/08/2012    Procedure: TRANSESOPHAGEAL ECHOCARDIOGRAM (TEE);  Surgeon: Pricilla Riffle, MD;  Location: [facility] ENDOSCOPY;  Service: Cardiovascular;  Laterality: N/A;  . Cardioversion N/A 09/08/2012    Procedure: CARDIOVERSION;  Surgeon: Pricilla Riffle, MD;  Location: [facility] ENDOSCOPY;  Service: Cardiovascular;  Laterality: N/A;    Current Outpatient Prescriptions  Medication Sig Dispense Refill  . amphetamine-dextroamphetamine (ADDERALL) 15 MG tablet Take 15 mg by mouth daily as needed (for additional focus).       Marland Kitchen atorvastatin (LIPITOR) 10 MG tablet Take 10 mg by mouth daily.      . dabigatran (PRADAXA) 150 MG CAPS Take 150 mg by mouth every 12 (twelve) hours.      Marland Kitchen diltiazem (CARDIZEM CD) 180 MG 24 hr capsule Take 180 mg by mouth daily.      Marland Kitchen escitalopram (LEXAPRO) 20 MG tablet Take 20 mg by mouth Daily. Lexapro      .  naproxen sodium (ANAPROX) 220 MG tablet Take 440 mg by mouth 2 (two) times daily as needed (for arthritis pain).      Marland Kitchen rOPINIRole (REQUIP) 1 MG tablet Take 1 mg by mouth at bedtime.        Marland Kitchen VYVANSE 70 MG capsule Take 70 mg by mouth Daily.        No current facility-administered medications for this visit.    Physical Exam: Filed Vitals:   02/09/13 1520  BP: 140/80  Pulse: 73  Height: 5\' 9"  (1.753 m)  Weight: 252 lb (114.306 kg)    GEN- The patient is well appearing, alert and oriented x 3 today.   Head- normocephalic, atraumatic Eyes-  Sclera clear, conjunctiva pink Ears- hearing intact Oropharynx- clear Lungs- Clear to ausculation bilaterally, normal work of breathing Heart- irregular rate and rhythm, no murmurs, rubs or gallops, PMI not laterally displaced GI- soft, NT, ND, + BS Extremities- no clubbing, cyanosis, or edema  ekg today reveals afib, V rate 96 bpm, septal infarct  Assessment and Plan:  1. afib She is appropriately anticoagulated She has failed medical therapy with flecainide and wishes to return to [place] which she tolerated well in the past. Her Qtc is <440 today.  We will stop flecainide and arrange for admission for tikosyn after 72 hour flecainide washout. She is instructed to  continue uninterupted anticoagulation in the interim.  2. HTN Stable  Return in 4 weeks after initiation of tikosyn

## 2013-02-13 ENCOUNTER — Ambulatory Visit (INDEPENDENT_AMBULATORY_CARE_PROVIDER_SITE_OTHER): Payer: Medicare Other | Admitting: Pharmacist

## 2013-02-13 ENCOUNTER — Encounter (HOSPITAL_COMMUNITY): Payer: Self-pay | Admitting: General Practice

## 2013-02-13 ENCOUNTER — Inpatient Hospital Stay (HOSPITAL_COMMUNITY)
Admission: AD | Admit: 2013-02-13 | Discharge: 2013-02-16 | DRG: 310 | Disposition: A | Discharge status: Home / Self Care | Payer: Medicare Other | Source: Ambulatory Visit | Attending: Internal Medicine | Admitting: Internal Medicine

## 2013-02-13 ENCOUNTER — Encounter: Payer: Self-pay | Admitting: Internal Medicine

## 2013-02-13 VITALS — BP 130/85 | HR 82 | Ht 69.0 in | Wt 250.5 lb

## 2013-02-13 DIAGNOSIS — F329 Major depressive disorder, single episode, unspecified: Secondary | ICD-10-CM | POA: Diagnosis present

## 2013-02-13 DIAGNOSIS — I4891 Unspecified atrial fibrillation: Secondary | ICD-10-CM

## 2013-02-13 DIAGNOSIS — R41840 Attention and concentration deficit: Secondary | ICD-10-CM | POA: Diagnosis present

## 2013-02-13 DIAGNOSIS — E876 Hypokalemia: Secondary | ICD-10-CM | POA: Diagnosis present

## 2013-02-13 DIAGNOSIS — I4892 Unspecified atrial flutter: Secondary | ICD-10-CM | POA: Diagnosis present

## 2013-02-13 DIAGNOSIS — F3289 Other specified depressive episodes: Secondary | ICD-10-CM | POA: Diagnosis present

## 2013-02-13 DIAGNOSIS — Z23 Encounter for immunization: Secondary | ICD-10-CM

## 2013-02-13 DIAGNOSIS — Z7901 Long term (current) use of anticoagulants: Secondary | ICD-10-CM

## 2013-02-13 DIAGNOSIS — I4819 Other persistent atrial fibrillation: Secondary | ICD-10-CM | POA: Diagnosis present

## 2013-02-13 DIAGNOSIS — E785 Hyperlipidemia, unspecified: Secondary | ICD-10-CM | POA: Diagnosis present

## 2013-02-13 DIAGNOSIS — I10 Essential (primary) hypertension: Secondary | ICD-10-CM | POA: Diagnosis present

## 2013-02-13 LAB — BASIC METABOLIC PANEL
BUN: 26 mg/dL — ABNORMAL HIGH (ref 6–23)
Chloride: 103 mEq/L (ref 96–112)
Creat: 1.15 mg/dL — ABNORMAL HIGH (ref 0.50–1.10)

## 2013-02-13 LAB — MAGNESIUM: Magnesium: 2.1 mg/dL (ref 1.5–2.5)

## 2013-02-13 MED ORDER — SODIUM CHLORIDE 0.9 % IJ SOLN
3.0000 mL | Freq: Two times a day (BID) | INTRAMUSCULAR | Status: DC
  Administered 2013-02-13 – 2013-02-15 (×5): 3 mL via INTRAVENOUS

## 2013-02-13 MED ORDER — ALUM & MAG HYDROXIDE-SIMETH 200-200-20 MG/5ML PO SUSP: 30.0000 mL | ORAL | Status: DC | PRN

## 2013-02-13 MED ORDER — ATORVASTATIN CALCIUM 10 MG PO TABS
10.0000 mg | ORAL_TABLET | Freq: Every day | ORAL | Status: DC
  Administered 2013-02-13 – 2013-02-16 (×4): 10 mg via ORAL
  Filled 2013-02-13 (×5): qty 1

## 2013-02-13 MED ORDER — SODIUM CHLORIDE 0.9 % IV SOLN: 250.0000 mL | INTRAVENOUS | Status: DC | PRN

## 2013-02-13 MED ORDER — DOFETILIDE 500 MCG PO CAPS
500.0000 ug | ORAL_CAPSULE | Freq: Two times a day (BID) | ORAL | Status: DC
  Administered 2013-02-13 – 2013-02-16 (×6): 500 ug via ORAL
  Filled 2013-02-13 (×9): qty 1

## 2013-02-13 MED ORDER — INFLUENZA VAC SPLIT QUAD 0.5 ML IM SUSP
0.5000 mL | INTRAMUSCULAR | Status: AC
  Administered 2013-02-14: 0.5 mL via INTRAMUSCULAR
  Filled 2013-02-13: qty 0.5

## 2013-02-13 MED ORDER — ROPINIROLE HCL 1 MG PO TABS
1.0000 mg | ORAL_TABLET | Freq: Every day | ORAL | Status: DC
  Administered 2013-02-13 – 2013-02-15 (×3): 1 mg via ORAL
  Filled 2013-02-13 (×4): qty 1

## 2013-02-13 MED ORDER — LISDEXAMFETAMINE DIMESYLATE 70 MG PO CAPS
70.0000 mg | ORAL_CAPSULE | Freq: Every day | ORAL | Status: DC
  Administered 2013-02-14 – 2013-02-16 (×3): 70 mg via ORAL
  Filled 2013-02-13 (×3): qty 1

## 2013-02-13 MED ORDER — DABIGATRAN ETEXILATE MESYLATE 150 MG PO CAPS
150.0000 mg | ORAL_CAPSULE | Freq: Two times a day (BID) | ORAL | Status: DC
  Administered 2013-02-13 – 2013-02-16 (×6): 150 mg via ORAL
  Filled 2013-02-13 (×7): qty 1

## 2013-02-13 MED ORDER — GUAIFENESIN-DM 100-10 MG/5ML PO SYRP: 15.0000 mL | ORAL_SOLUTION | ORAL | Status: DC | PRN

## 2013-02-13 MED ORDER — LOPERAMIDE HCL 2 MG PO CAPS: 2.0000 mg | ORAL_CAPSULE | ORAL | Status: DC | PRN

## 2013-02-13 MED ORDER — SODIUM CHLORIDE 0.9 % IJ SOLN: 3.0000 mL | INTRAMUSCULAR | Status: DC | PRN

## 2013-02-13 MED ORDER — NAPROXEN 375 MG PO TABS
375.0000 mg | ORAL_TABLET | Freq: Two times a day (BID) | ORAL | Status: DC
  Filled 2013-02-13 (×7): qty 1

## 2013-02-13 MED ORDER — ACETAMINOPHEN 325 MG PO TABS
650.0000 mg | ORAL_TABLET | Freq: Four times a day (QID) | ORAL | Status: DC | PRN
  Administered 2013-02-13: 650 mg via ORAL
  Filled 2013-02-13: qty 2

## 2013-02-13 MED ORDER — NAPROXEN SODIUM 275 MG PO TABS: 440.0000 mg | ORAL_TABLET | Freq: Two times a day (BID) | ORAL | Status: DC | PRN

## 2013-02-13 MED ORDER — ESCITALOPRAM OXALATE 20 MG PO TABS
20.0000 mg | ORAL_TABLET | Freq: Every day | ORAL | Status: DC
  Administered 2013-02-14 – 2013-02-16 (×3): 20 mg via ORAL
  Filled 2013-02-13 (×3): qty 1

## 2013-02-13 MED ORDER — MAGNESIUM HYDROXIDE 400 MG/5ML PO SUSP: 30.0000 mL | Freq: Every day | ORAL | Status: DC | PRN

## 2013-02-13 NOTE — Progress Notes (Signed)
    Rose Ball is a 67 y.o. female who presents today for Tikosyn initiaton.  She has a history of atrial fibrillation and underwent convergent procedure at [facility] April 2013.  She was placed on Tikosyn BID at that time.  SHe reports doing well for the first year so she stopped taking the Tikosyn in April 2014.  At her visit with Dr. Johney Frame in June 2014, she was back in atrial fibrillation so she started flecainide.  Recently developed recurrent afib despite medical therapy with flecainide.  She reported palpitations, fatigue, and decreased exercise tolerance at her visit on 02/09/13.    Decision was made to stop flecainide and change back to Tikosyn.   Reviewed pt's medication list.  She is currently not taking any QTc prolongating or contraindicated medications.  She states she was taking Lexapro 20mg  with Tikosyn prior with no issues related to QTc prolongation.  She took her last dose of flecainide on the AM of 11/10.  She also stopped her diltiazem that day as well.  She has been appropriately anticoagulated with Pradaxa and reports compliance over the past month.    Current Outpatient Prescriptions  Medication Sig Dispense Refill  . amphetamine-dextroamphetamine (ADDERALL) 15 MG tablet Take 15 mg by mouth daily as needed (for additional focus).       Marland Kitchen atorvastatin (LIPITOR) 10 MG tablet Take 10 mg by mouth daily.      . dabigatran (PRADAXA) 150 MG CAPS Take 150 mg by mouth every 12 (twelve) hours.      Marland Kitchen escitalopram (LEXAPRO) 20 MG tablet Take 20 mg by mouth Daily. Lexapro      . naproxen sodium (ANAPROX) 220 MG tablet Take 440 mg by mouth 2 (two) times daily as needed (for arthritis pain).      Marland Kitchen rOPINIRole (REQUIP) 1 MG tablet Take 1 mg by mouth at bedtime.        Marland Kitchen VYVANSE 70 MG capsule Take 70 mg by mouth Daily.       Marland Kitchen diltiazem (CARDIZEM CD) 180 MG 24 hr capsule Take 180 mg by mouth daily.       No current facility-administered medications for this visit.    Physical  Exam: Filed Vitals:   02/13/13 0937  BP: 130/85  Pulse: 82  Height: 5\' 9"  (1.753 m)  Weight: 250 lb 8 oz (113.626 kg)    EKG reviewed by Dr. Ladona Ridgel.  Afib with a vent. Rate of 98 bpm.  QTc- 430 msec  Assessment and Plan:

## 2013-02-13 NOTE — Progress Notes (Signed)
Notified [name] that patient had arrived. She stated Rose Ball would be coming to see pt

## 2013-02-13 NOTE — H&P (Addendum)
Primary Cardiologist: Dr Patty Sermons   Rose Ball is a 67 y.o. female who presents today for initiation of tikosyn. She recently developed recurrent afib despite medical therapy with flecainide. She reports palpitations, fatigue, and decreased exercise tolerance.  She is s/p convergent procedure at [facility].  She has previously done well with tikosyn.  She is appropriately anticoagulated with pradaxa. Today, she denies symptoms of chest pain, shortness of breath, lower extremity edema, dizziness, presyncope, or syncope. The patient is otherwise without complaint today.   Past Medical History   Diagnosis  Date   .  Hypertension    .  Persistent atrial fibrillation      s/p cardioversion Sept 2012; failed   .  Morbid obesity    .  Attention deficit      on Vyvanse   .  Dyslipidemia    .  Palpitations    .  Hypokalemia    .  Chronic anticoagulation    .  Depression     Past Surgical History   Procedure  Laterality  Date   .  Breast biopsy     .  Tonsillectomy     .  Cardioversion   Sept 2012     failed   .  US echocardiography   Aug 2012     Normal systolic function with EF 55% and slight LAE at 4.4cm   .  Convergent procedure   11/13/11     afib ablation at [facility]   .  Loop recorder insertion  Left  09/2011   .  Tee without cardioversion  N/A  09/08/2012     Procedure: TRANSESOPHAGEAL ECHOCARDIOGRAM (TEE); Surgeon: Pricilla Riffle, MD; Location: [facility] ENDOSCOPY; Service: Cardiovascular; Laterality: N/A;   .  Cardioversion  N/A  09/08/2012     Procedure: CARDIOVERSION; Surgeon: Pricilla Riffle, MD; Location: [facility] ENDOSCOPY; Service: Cardiovascular; Laterality: N/A;    Current Outpatient Prescriptions   Medication  Sig  Dispense  Refill   .  amphetamine-dextroamphetamine (ADDERALL) 15 MG tablet  Take 15 mg by mouth daily as needed (for additional focus).     Marland Kitchen  atorvastatin (LIPITOR) 10 MG tablet  Take 10 mg by mouth daily.     .  dabigatran (PRADAXA) 150 MG CAPS  Take 150 mg by mouth every 12  (twelve) hours.     Marland Kitchen  diltiazem (CARDIZEM CD) 180 MG 24 hr capsule  Take 180 mg by mouth daily.     Marland Kitchen  escitalopram (LEXAPRO) 20 MG tablet  Take 20 mg by mouth Daily. Lexapro     .  naproxen sodium (ANAPROX) 220 MG tablet  Take 440 mg by mouth 2 (two) times daily as needed (for arthritis pain).     Marland Kitchen  rOPINIRole (REQUIP) 1 MG tablet  Take 1 mg by mouth at bedtime.     Marland Kitchen  VYVANSE 70 MG capsule  Take 70 mg by mouth Daily.      No current facility-administered medications for this visit.   Physical Exam:  Filed Vitals:   02/13/13 1617  BP: 150/96  Pulse: 107  Temp: 98.1 F (36.7 C)  Resp: 18                  252 lb (114.306 kg)   GEN- The patient is well appearing, alert and oriented x 3 today.  Head- normocephalic, atraumatic  Eyes- Sclera clear, conjunctiva pink  Ears- hearing intact  Oropharynx- clear  Lungs- Clear to ausculation bilaterally, normal work  of breathing  Heart- irregular rate and rhythm, no murmurs, rubs or gallops, PMI not laterally displaced  GI- soft, NT, ND, + BS  Extremities- no clubbing, cyanosis, or edema   ekg today reveals afib, V rate 96 bpm, Qtc 430  Assessment and Plan:  1. afib  She is appropriately anticoagulated  She has failed medical therapy with flecainide and wishes to return to [place] which she tolerated well in the past.  Her Qtc is 430 today.  I will therefore initiate tikosyn 500 mcg BID.  Her QTc is difficult to follow with atrial flutter but should be easier to follow once she is in sinus.  I would therefore be reluctant to reduce her dose unless her QT clearly prolongs.  If she does not convert with tikosyn then she should be cardioverted on Sunday.  This can be easily arranged on the weekends by calling the anesthesia CRNA on call at [phone].  She will receive her 6th dose of tikosyn on Monday am and should be discharged at that time if she has been cardioverted on Sunday.  2. HTN  Stable

## 2013-02-13 NOTE — Progress Notes (Signed)
Pharmacy Consult for Dofetilide (Tikosyn) Iniation  Admit Complaint: 67 y.o. female admitted 02/13/2013 with atrial fibrillation to be initiated on dofetilide.   Assessment:  Patient Exclusion Criteria: If any screening criteria checked as "Yes", then  patient  should NOT receive dofetilide until criteria item is corrected. If "Yes" please indicate correction plan.  YES  NO Patient  Exclusion Criteria Correction Plan  []  [x]  Baseline QTc interval is greater than or equal to 440 msec. IF above YES box checked dofetilide contraindicated unless patient has ICD; then may proceed if QTc 500-550 msec or with known ventricular conduction abnormalities may proceed with QTc 550-600 msec. QTc =     []  [x]  Magnesium level is less than 1.8 mEq/l : Last magnesium:  Lab Results  Component Value Date   MG 2.1 02/13/2013         []  [x]  Potassium level is less than 4 mEq/l : Last potassium:  Lab Results  Component Value Date   K 4.0 02/13/2013         []  [x]  Patient is known or suspected to have a digoxin level greater than 2 ng/ml: No results found for this basename: DIGOXIN      []  [x]  Creatinine clearance less than 20 ml/min (calculated using Cockcroft-Gault, actual body weight and serum creatinine): The CrCl is unknown because both a height and weight (above a minimum accepted value) are required for this calculation.    []  [x]  Patient has received drugs known to prolong the QT intervals within the last 48 hours(phenothiazines, tricyclics or tetracyclic antidepressants, erythromycin, H-1 antihistamines, cisapride, fluoroquinolones, azithromycin). Drugs not listed above may have an, as yet, undetected potential to prolong the QT interval, updated information on QT prolonging agents is available at this website:QT prolonging agents   []  [x]  Patient received a dose of hydrochlorothiazide (Oretic) alone or in any combination including triamterene (Dyazide, Maxzide) in the last 48 hours.   []  [x]   Patient received a medication known to increase dofetilide plasma concentrations prior to initial dofetilide dose:    Trimethoprim (Primsol, Proloprim) in the last 36 hours   Verapamil (Calan, Verelan) in the last 36 hours or a sustained release dose in the last 72 hours   Megestrol (Megace) in the last 5 days    Cimetidine (Tagamet) in the last 6 hours   Ketoconazole (Nizoral) in the last 24 hours   Itraconazole (Sporanox) in the last 48 hours    Prochlorperazine (Compazine) in the last 36 hours    []  [x]  Patient is known to have a history of torsades de pointes; congenital or acquired long QT syndromes.   []  [x]  Patient has received a Class 1 antiarrhythmic with less than 2 half-lives since last dose. (Disopyramide, Quinidine, Procainamide, Lidocaine, Mexiletine, Flecainide, Propafenone)   []  [x]  Patient has received amiodarone therapy in the past 3 months or amiodarone level is greater than 0.3 ng/ml.    Patient has been appropriately anticoagulated with Pradaxa 150mg  BID and per pt has been compliant.  Ordering provider was confirmed at [email or web address] if they are not listed on the [facility] Authorized Prescribers list.  Goal of Therapy:  Follow renal function, electrolytes, potential drug interactions, and dose adjustment. Provide education and 1 week supply at discharge.  Plan:  1.  Initiate dofetilide based on renal function: Select One Calculated CrCl  Dose q12h  [x]  > 60 ml/min 500 mcg  []  40-60 ml/min 250 mcg  []  20-40 ml/min 125 mcg   2. Follow up QTc after  the first 5 doses, renal function, electrolytes (K & Mg) daily x 3     days, dose adjustment, success of initiation and facilitate 1 week discharge supply as     clinically indicated.  3. Initiate Tikosyn education video (Call [phone] and ask for video # [phone]).  4. Place Enrollment Form on the chart for discharge supply of dofetilide.   Marcelino Scot 6:38 PM 02/13/2013

## 2013-02-13 NOTE — Assessment & Plan Note (Signed)
Reviewed pt's labs.  K- 4.0, Mg 2.1.  Both acceptable for Tikosyn.  QTc 430 per Dr. Ladona Ridgel.  SCr- 1.15; CrCl- 63mL/min.  Pt qualifies for BID.  When she took it last time, she was on BID. Would consider starting dose somewhere between 250-571mcg BID.

## 2013-02-14 LAB — BASIC METABOLIC PANEL
CO2: 22 mEq/L (ref 19–32)
Calcium: 8.9 mg/dL (ref 8.4–10.5)
Creatinine, Ser: 0.97 mg/dL (ref 0.50–1.10)
GFR calc non Af Amer: 60 mL/min — ABNORMAL LOW (ref >90)
Glucose, Bld: 97 mg/dL (ref 70–99)
Potassium: 3.7 mEq/L (ref 3.5–5.1)
Sodium: 140 mEq/L (ref 135–145)

## 2013-02-14 MED ORDER — POTASSIUM CHLORIDE CRYS ER 20 MEQ PO TBCR
40.0000 meq | EXTENDED_RELEASE_TABLET | Freq: Once | ORAL | Status: AC
  Administered 2013-02-14: 40 meq via ORAL
  Filled 2013-02-14: qty 2

## 2013-02-14 MED ORDER — METOPROLOL TARTRATE 25 MG PO TABS
25.0000 mg | ORAL_TABLET | Freq: Four times a day (QID) | ORAL | Status: DC
  Administered 2013-02-14 – 2013-02-16 (×8): 25 mg via ORAL
  Filled 2013-02-14 (×11): qty 1

## 2013-02-14 NOTE — Progress Notes (Signed)
Subjective:  Feels okay. No chest pain or dyspnea. Rhythm atrial flutter with controlled VR.   Objective:  Vital Signs in the last 24 hours: Temp:  [98.1 F (36.7 C)-98.4 F (36.9 C)] 98.4 F (36.9 C) (11/15 0500) Pulse Rate:  [88-107] 92 (11/15 0500) Resp:  [18-20] 18 (11/15 0500) BP: (150-169)/(84-96) 169/84 mmHg (11/15 0500) SpO2:  [96 %-98 %] 97 % (11/15 0500)  Intake/Output from previous day:   Intake/Output from this shift:    . atorvastatin  10 mg Oral Daily  . dabigatran  150 mg Oral Q12H  . dofetilide  500 mcg Oral Q12H  . escitalopram  20 mg Oral Daily  . influenza vac split quadrivalent PF  0.5 mL Intramuscular Tomorrow-1000  . lisdexamfetamine  70 mg Oral Daily  . naproxen  375 mg Oral BID PC  . rOPINIRole  1 mg Oral QHS  . sodium chloride  3 mL Intravenous Q12H      Physical Exam: The patient appears to be in no distress.  Head and neck exam reveals that the pupils are equal and reactive.  The extraocular movements are full.  There is no scleral icterus.  Mouth and pharynx are benign.  No lymphadenopathy.  No carotid bruits.  The jugular venous pressure is normal.  Thyroid is not enlarged or tender.  Chest is clear to percussion and auscultation.  No rales or rhonchi.  Expansion of the chest is symmetrical.  Heart reveals no abnormal lift or heave.  First and second heart sounds are normal.  There is no murmur gallop rub or click. Pulse irregular.  The abdomen is soft and nontender.  Bowel sounds are normoactive.  There is no hepatosplenomegaly or mass.  There are no abdominal bruits.  Extremities reveal no phlebitis or edema.  Pedal pulses are good.  There is no cyanosis or clubbing.  Neurologic exam is normal strength and no lateralizing weakness.  No sensory deficits.  Integument reveals no rash  Lab Results: No results found for this basename: WBC, HGB, PLT,  in the last 72 hours  Recent Labs  02/13/13 0941 02/14/13 0529  NA 140 140  K  4.0 3.7  CL 103 105  CO2 28 22  GLUCOSE 89 97  BUN 26* 28*  CREATININE 1.15* 0.97   No results found for this basename: TROPONINI, CK, MB,  in the last 72 hours Hepatic Function Panel No results found for this basename: PROT, ALBUMIN, AST, ALT, ALKPHOS, BILITOT, BILIDIR, IBILI,  in the last 72 hours No results found for this basename: CHOL,  in the last 72 hours No results found for this basename: PROTIME,  in the last 72 hours  Imaging: Imaging results have been reviewed  Cardiac Studies: QTc 493 Assessment/Plan:  1. afib  She is appropriately anticoagulated  She has failed medical therapy with flecainide and wishes to return to [place] which she tolerated well in the past. Modest QTc prolongation today but as per Dr. Jenel Lucks note will persevere with present dose. . If she does not convert with tikosyn then she should be cardioverted on Sunday. This can be easily arranged on the weekends by calling the anesthesia CRNA on call at [phone]. She will receive her 6th dose of tikosyn on Monday am and should be discharged at that time if she has been cardioverted on Sunday.  2. HTN  Stable  Plan: Will keep NPO tonight for possible cardioversion in am.   LOS: 1 day    Rose Ball  Rose Ball 02/14/2013, 11:29 AM

## 2013-02-15 ENCOUNTER — Encounter (HOSPITAL_COMMUNITY)
Admission: AD | Disposition: A | Discharge status: Home / Self Care | Payer: Self-pay | Source: Ambulatory Visit | Attending: Internal Medicine

## 2013-02-15 ENCOUNTER — Encounter (HOSPITAL_COMMUNITY): Payer: Medicare Other | Admitting: Anesthesiology

## 2013-02-15 ENCOUNTER — Inpatient Hospital Stay (HOSPITAL_COMMUNITY): Payer: Medicare Other | Admitting: Anesthesiology

## 2013-02-15 DIAGNOSIS — I4891 Unspecified atrial fibrillation: Secondary | ICD-10-CM

## 2013-02-15 HISTORY — PX: CARDIOVERSION: SHX1299

## 2013-02-15 LAB — BASIC METABOLIC PANEL
CO2: 25 mEq/L (ref 19–32)
Chloride: 106 mEq/L (ref 96–112)
Creatinine, Ser: 0.97 mg/dL (ref 0.50–1.10)
GFR calc Af Amer: 69 mL/min — ABNORMAL LOW (ref >90)
Glucose, Bld: 150 mg/dL — ABNORMAL HIGH (ref 70–99)
Potassium: 4 mEq/L (ref 3.5–5.1)

## 2013-02-15 LAB — MAGNESIUM: Magnesium: 2 mg/dL (ref 1.5–2.5)

## 2013-02-15 LAB — PROTIME-INR: INR: 1.16 (ref 0.00–1.49)

## 2013-02-15 SURGERY — CARDIOVERSION
Anesthesia: General | Wound class: Clean

## 2013-02-15 MED ORDER — SODIUM CHLORIDE 0.45 % IV SOLN
INTRAVENOUS | Status: DC
  Administered 2013-02-15: 10:00:00 via INTRAVENOUS

## 2013-02-15 MED ORDER — SODIUM CHLORIDE 0.9 % IV SOLN: 250.0000 mL | INTRAVENOUS | Status: DC

## 2013-02-15 MED ORDER — SODIUM CHLORIDE 0.9 % IJ SOLN: 3.0000 mL | Freq: Two times a day (BID) | INTRAMUSCULAR | Status: DC

## 2013-02-15 MED ORDER — PROPOFOL 10 MG/ML IV BOLUS
INTRAVENOUS | Status: DC | PRN
  Administered 2013-02-15: 80 mg via INTRAVENOUS

## 2013-02-15 MED ORDER — SODIUM CHLORIDE 0.9 % IJ SOLN: 3.0000 mL | INTRAMUSCULAR | Status: DC | PRN

## 2013-02-15 MED ORDER — LIDOCAINE HCL (CARDIAC) 20 MG/ML IV SOLN
INTRAVENOUS | Status: DC | PRN
  Administered 2013-02-15: 60 mg via INTRAVENOUS

## 2013-02-15 NOTE — CV Procedure (Signed)
Electrical Cardioversion Procedure Note Rose Ball [id number] [birth year]  Procedure: Electrical Cardioversion Indications:  Atrial Fibrillation  Procedure Details Consent: Risks of procedure as well as the alternatives and risks of each were explained to the (patient/caregiver).  Consent for procedure obtained. Time Out: Verified patient identification, verified procedure, site/side was marked, verified correct patient position, special equipment/implants available, medications/allergies/relevent history reviewed, required imaging and test results available.  Performed  Patient placed on cardiac monitor, pulse oximetry, supplemental oxygen as necessary.  Sedation given: propofol 80 Pacer pads placed anterior and posterior chest.  Cardioverted 1 time(s).  Cardioverted at 120J.  Evaluation Findings: Post procedure EKG shows: NSR Complications: None Patient did tolerate procedure well.   Cassell Clement 02/15/2013, 11:41 AM

## 2013-02-15 NOTE — Progress Notes (Signed)
Subjective:  Feels okay. No chest pain or dyspnea. Rhythm atrial flutter with controlled VR. Runs of wide complex beats possibly rate-related LBBB. Asymptomatic. Tolerating tikosyn so far. Spoke with anaethesia. Will plan cardioversion at around 1 pm today. Her next dose of tikosyn is due at 10 am.  Labs today stable.  Objective:  Vital Signs in the last 24 hours: Temp:  [98.1 F (36.7 C)-99.4 F (37.4 C)] 98.1 F (36.7 C) (11/16 0539) Pulse Rate:  [86-116] 86 (11/16 0539) Resp:  [16-18] 18 (11/16 0539) BP: (125-148)/(58-88) 125/58 mmHg (11/16 0539) SpO2:  [96 %-97 %] 97 % (11/16 0539) Weight:  [250 lb 7.1 oz (113.6 kg)] 250 lb 7.1 oz (113.6 kg) (11/15 1100)  Intake/Output from previous day: 11/15 0701 - 11/16 0700 In: 960 [P.O.:960] Out: -  Intake/Output from this shift:    . atorvastatin  10 mg Oral Daily  . dabigatran  150 mg Oral Q12H  . dofetilide  500 mcg Oral Q12H  . escitalopram  20 mg Oral Daily  . lisdexamfetamine  70 mg Oral Daily  . metoprolol tartrate  25 mg Oral QID  . naproxen  375 mg Oral BID PC  . rOPINIRole  1 mg Oral QHS  . sodium chloride  3 mL Intravenous Q12H      Physical Exam: The patient appears to be in no distress.  Head and neck exam reveals that the pupils are equal and reactive.  The extraocular movements are full.  There is no scleral icterus.  Mouth and pharynx are benign.  No lymphadenopathy.  No carotid bruits.  The jugular venous pressure is normal.  Thyroid is not enlarged or tender.  Chest is clear to percussion and auscultation.  No rales or rhonchi.  Expansion of the chest is symmetrical.  Heart reveals no abnormal lift or heave.  First and second heart sounds are normal.  There is no murmur gallop rub or click. Pulse irregular.  The abdomen is soft and nontender.  Bowel sounds are normoactive.  There is no hepatosplenomegaly or mass.  There are no abdominal bruits.  Extremities reveal no phlebitis or edema.  Pedal pulses  are good.  There is no cyanosis or clubbing.  Neurologic exam is normal strength and no lateralizing weakness.  No sensory deficits.  Integument reveals no rash  Lab Results: No results found for this basename: WBC, HGB, PLT,  in the last 72 hours  Recent Labs  02/14/13 0529 02/15/13 0420  NA 140 140  K 3.7 4.0  CL 105 106  CO2 22 25  GLUCOSE 97 150*  BUN 28* 22  CREATININE 0.97 0.97   No results found for this basename: TROPONINI, CK, MB,  in the last 72 hours Hepatic Function Panel No results found for this basename: PROT, ALBUMIN, AST, ALT, ALKPHOS, BILITOT, BILIDIR, IBILI,  in the last 72 hours No results found for this basename: CHOL,  in the last 72 hours No results found for this basename: PROTIME,  in the last 72 hours  Imaging: Imaging results have been reviewed  Cardiac Studies: Telemetry shows atrial fib with controlled VR Assessment/Plan:  1. afib  She is appropriately anticoagulated  She has failed medical therapy with flecainide and wishes to return to [place] which she tolerated well in the past.  . If she does not convert with tikosyn then she should be cardioverted on Sunday. This can be easily arranged on the weekends by calling the anesthesia CRNA on call at [phone].  She will receive her 6th dose of tikosyn on Monday am and should be discharged at that time if she has been cardioverted on Sunday.  2. HTN stable  Plan:  DCCV at about 1 pm today. Discussed with patient who is in agreement.  LOS: 2 days    Cassell Clement 02/15/2013, 8:10 AM

## 2013-02-15 NOTE — Transfer of Care (Signed)
Immediate Anesthesia Transfer of Care Note  Patient: Rose Ball  Procedure(s) Performed: Procedure(s): CARDIOVERSION-BEDSIDE (N/A)  Patient Location: Nursing Unit  Anesthesia Type:General  Level of Consciousness: sedated  Airway & Oxygen Therapy: Patient Spontanous Breathing and Patient connected to nasal cannula oxygen  Post-op Assessment: report to floor rn   Post vital signs: Reviewed and stable  Complications: No apparent anesthesia complications

## 2013-02-15 NOTE — Anesthesia Postprocedure Evaluation (Signed)
  Anesthesia Post-op Note  Patient: Rose Ball  Procedure(s) Performed: Procedure(s): CARDIOVERSION-BEDSIDE (N/A)  Patient Location: Nursing Unit  Anesthesia Type:General  Level of Consciousness: awake  Airway and Oxygen Therapy: Patient Spontanous Breathing and Patient connected to nasal cannula oxygen  Post-op Pain: none  Post-op Assessment: Post-op Vital signs reviewed, Patient's Cardiovascular Status Stable and Respiratory Function Stable  Post-op Vital Signs: Reviewed and stable  Complications: No apparent anesthesia complications

## 2013-02-15 NOTE — Anesthesia Preprocedure Evaluation (Signed)
Anesthesia Evaluation  Patient identified by MRN, date of birth, ID band Patient awake    Reviewed: Allergy & Precautions, H&P , NPO status , Patient's Chart, lab work & pertinent test results  History of Anesthesia Complications Negative for: history of anesthetic complications  Airway Mallampati: II TM Distance: >3 FB Neck ROM: Full    Dental  (+) Teeth Intact and Dental Advisory Given   Pulmonary neg pulmonary ROS,  breath sounds clear to auscultation  Pulmonary exam normal       Cardiovascular hypertension, Pt. on medications + dysrhythmias (normal LVF by TEE today) Atrial Fibrillation Rhythm:Irregular Rate:Tachycardia     Neuro/Psych PSYCHIATRIC DISORDERS negative neurological ROS     GI/Hepatic negative GI ROS, Neg liver ROS,   Endo/Other  Morbid obesity  Renal/GU      Musculoskeletal   Abdominal (+) + obese,   Peds  Hematology   Anesthesia Other Findings   Reproductive/Obstetrics                           Anesthesia Physical Anesthesia Plan  ASA: III  Anesthesia Plan: General   Post-op Pain Management:    Induction: Intravenous  Airway Management Planned: Natural Airway and Mask  Additional Equipment:   Intra-op Plan:   Post-operative Plan:   Informed Consent: I have reviewed the patients History and Physical, chart, labs and discussed the procedure including the risks, benefits and alternatives for the proposed anesthesia with the patient or authorized representative who has indicated his/her understanding and acceptance.     Plan Discussed with: CRNA  Anesthesia Plan Comments:         Anesthesia Quick Evaluation

## 2013-02-15 NOTE — Preoperative (Signed)
Beta Blockers   Reason not to administer Beta Blockers:given at 1009 on 02/15/2013

## 2013-02-15 NOTE — Progress Notes (Signed)
Patient is having 5-7 beat runs of V-tach on telemetry. Patient is asymptomatic. Notified Dr. Don Broach. Orders are to monitor the patient and call rounding MD in the AM before next dose of Tikosyn. Will continue to monitor. Earnest Conroy RN

## 2013-02-16 ENCOUNTER — Encounter (HOSPITAL_COMMUNITY): Payer: Self-pay | Admitting: Cardiology

## 2013-02-16 DIAGNOSIS — Z7901 Long term (current) use of anticoagulants: Secondary | ICD-10-CM

## 2013-02-16 DIAGNOSIS — I4819 Other persistent atrial fibrillation: Secondary | ICD-10-CM | POA: Diagnosis present

## 2013-02-16 LAB — MAGNESIUM: Magnesium: 2.1 mg/dL (ref 1.5–2.5)

## 2013-02-16 LAB — BASIC METABOLIC PANEL
CO2: 27 mEq/L (ref 19–32)
Creatinine, Ser: 0.94 mg/dL (ref 0.50–1.10)
GFR calc non Af Amer: 62 mL/min — ABNORMAL LOW (ref >90)
Glucose, Bld: 107 mg/dL — ABNORMAL HIGH (ref 70–99)
Potassium: 4.1 mEq/L (ref 3.5–5.1)
Sodium: 137 mEq/L (ref 135–145)

## 2013-02-16 MED ORDER — DOFETILIDE 500 MCG PO CAPS: 500.0000 ug | ORAL_CAPSULE | Freq: Two times a day (BID) | ORAL | Status: DC

## 2013-02-16 MED ORDER — METOPROLOL TARTRATE 25 MG PO TABS: 50.0000 mg | ORAL_TABLET | Freq: Four times a day (QID) | ORAL | Status: DC

## 2013-02-16 MED ORDER — METOPROLOL TARTRATE 50 MG PO TABS: 50.0000 mg | ORAL_TABLET | Freq: Two times a day (BID) | ORAL | Status: DC

## 2013-02-16 MED ORDER — POTASSIUM CHLORIDE ER 10 MEQ PO TBCR: 10.0000 meq | EXTENDED_RELEASE_TABLET | ORAL | Status: DC

## 2013-02-16 NOTE — Progress Notes (Signed)
Pt provided with dc instructions and educatino. Tikosyn prescription handed to patient and 7day sent to main pharmacy. No questions at this time. Pt aware of how/when to take all new meds. IV removed with tip intact. Heart monitor cleaned and returned to front. Pt awaitng tikosyn prescription to be filed prior to dc. Levonne Spiller, RN

## 2013-02-16 NOTE — Progress Notes (Signed)
   SUBJECTIVE: The patient is doing well today.  At this time, she denies chest pain, shortness of breath, or any new concerns.  She was admitted 11-14 for Tikosyn loading, successfully cardioverted yesterday to SR.  QTc stable on of Tikosyn twice daily.    CURRENT MEDICATIONS: . atorvastatin  10 mg Oral Daily  . dabigatran  150 mg Oral Q12H  . dofetilide  500 mcg Oral Q12H  . escitalopram  20 mg Oral Daily  . lisdexamfetamine  70 mg Oral Daily  . metoprolol tartrate  25 mg Oral QID  . naproxen  375 mg Oral BID PC  . rOPINIRole  1 mg Oral QHS  . sodium chloride  3 mL Intravenous Q12H  . sodium chloride  3 mL Intravenous Q12H   . sodium chloride 50 mL/hr at 02/15/13 1009  . sodium chloride      OBJECTIVE: Physical Exam: Filed Vitals:   02/15/13 0900 02/15/13 1400 02/15/13 2107 02/16/13 0521  BP: 138/60 118/74 146/75 138/65  Pulse:  63 63 55  Temp:  97.6 F (36.4 C) 98.7 F (37.1 C) 98.2 F (36.8 C)  TempSrc:   Oral Oral  Resp:  17 18 18   Height:      Weight:      SpO2:  98% 98% 96%    Intake/Output Summary (Last 24 hours) at 02/16/13 0622 Last data filed at 02/15/13 1800  Gross per 24 hour  Intake    660 ml  Output      4 ml  Net    656 ml    Telemetry reveals sinus rhythm  GEN- The patient is well appearing, alert and oriented x 3 today.   Head- normocephalic, atraumatic Eyes-  Sclera clear, conjunctiva pink Ears- hearing intact Oropharynx- clear Neck- supple, no JVP Lymph- no cervical lymphadenopathy Lungs- Clear to ausculation bilaterally, normal work of breathing Heart- Regular rate and rhythm, no murmurs, rubs or gallops, PMI not laterally displaced GI- soft, NT, ND, + BS Extremities- no clubbing, cyanosis, or edema Neuro- strength and sensation are intact  LABS: Basic Metabolic Panel:  Recent Labs  16/10/96 0529 02/15/13 0420  NA 140 140  K 3.7 4.0  CL 105 106  CO2 22 25  GLUCOSE 97 150*  BUN 28* 22  CREATININE 0.97 0.97  CALCIUM  8.9 8.7  MG 2.1 2.0   ekgs are reviewed, 11/16 ekg reveals sinus rhythm with stable QTc  ASSESSMENT AND PLAN:   1. afib Maintaining sinus rhythm with tikosyn Continue pradaxa  2. HTN Stable  DC to home today Will need EKG,BMET, and Mg in 1 week.  Follow-up with me in 4 weeks

## 2013-02-16 NOTE — Discharge Summary (Signed)
CARDIOLOGY DISCHARGE SUMMARY   Patient ID: Rose Ball MRN: [id number] DOB/AGE: Mar 08, 1946 67 y.o.  Admit date: 02/13/2013 Discharge date: 02/16/2013  Primary Discharge Diagnosis:     Persistent atrial fibrillation Secondary Discharge Diagnosis:    Hypertension   Chronic anticoagulation, on Pradaxa  Procedures: Direct current cardioversion  Hospital Course: Rose Ball is a 67 y.o. female with a history of atrial fibrillation. She is symptomatic with the atrial fibrillation, and Dr. Johney Frame evaluated her for this. She failed flecainide and had done well with Tikosyn in the past. He admitted her for Tikosyn loading on 02/13/2013, with a plan for cardioversion if she did not spontaneously convert on this medication.  Her labs and her QTC were followed closely on the medication. She required one dose of potassium for supplementation but her magnesium remained stable. She will take intermittent potassium at discharge. Her QTC was followed closely on the Tikosyn but she did not require any dose changes. She had been compliant with Pradaxa anticoagulation prior to admission and this was continued.  She did not convert spontaneously on the Tikosyn so after her fourth dose, she was cardioverted on 02/15/2013. She was sedated by anesthesia and cardioverted with one shock at 120 J. She tolerated the procedure well. She maintains sinus rhythm after this.  On 02/16/2013, she was seen by Dr. Johney Frame. He evaluated Rose Ball and reviewed all data. He felt that she could be safely discharged home after her sixth Tikosyn dose, to follow up with an ECG and lab work in one week and then with him in 4 weeks.  Labs:   Lab Results  Component Value Date   WBC 8.3 09/03/2012   HGB 14.1 09/03/2012   HCT 42.6 09/03/2012   MCV 83.2 09/03/2012   PLT 284.0 09/03/2012     Recent Labs Lab 02/16/13 0550  NA 137  K 4.1  CL 102  CO2 27  BUN 23  CREATININE 0.94  CALCIUM 8.7  GLUCOSE 107*     Recent Labs  02/15/13 0930  INR 1.16    EKG: 02/15/2013 Sinus rhythm with first degree AV block Vent. rate 58 BPM PR interval 214 ms QRS duration 84 ms QT/QTc 470/461 ms P-R-T axes 102 5 28  FOLLOW UP PLANS AND APPOINTMENTS Allergies  Allergen Reactions  . Amiodarone Other (See Comments)    REACTION: dyspnea     Medication List         amphetamine-dextroamphetamine 15 MG tablet  Commonly known as:  ADDERALL  Take 15 mg by mouth daily as needed (for additional focus).     atorvastatin 10 MG tablet  Commonly known as:  LIPITOR  Take 10 mg by mouth daily.     dabigatran 150 MG Caps capsule  Commonly known as:  PRADAXA  Take 150 mg by mouth every 12 (twelve) hours.     dofetilide 500 MCG capsule  Commonly known as:  TIKOSYN  Take 1 capsule (500 mcg total) by mouth 2 (two) times daily.     escitalopram 20 MG tablet  Commonly known as:  LEXAPRO  Take 20 mg by mouth Daily. Lexapro     metoprolol tartrate 25 MG tablet  Commonly known as:  LOPRESSOR  Take 2 tablets (50 mg total) by mouth 4 (four) times daily.     naproxen sodium 220 MG tablet  Commonly known as:  ANAPROX  Take 440 mg by mouth 2 (two) times daily as needed (for arthritis pain).  potassium chloride 10 MEQ tablet  Commonly known as:  K-DUR  Take 1 tablet (10 mEq total) by mouth 2 (two) times a week.     rOPINIRole 1 MG tablet  Commonly known as:  REQUIP  Take 1 mg by mouth at bedtime.     VYVANSE 70 MG capsule  Generic drug:  lisdexamfetamine  Take 70 mg by mouth Daily.        Discharge Orders   Future Appointments Provider Department Dept Phone   02/24/2013 8:30 AM [facility] [place] [facility] [phone]   02/24/2013 9:00 AM [facility] Nurse [facility] [place] Office [phone]   03/11/2013 12:00 PM Hillis Range, MD [place] [facility] [phone]   Future Orders Complete By Expires   Diet - low sodium heart healthy  As directed     Increase activity slowly  As directed      Follow-up Information   Follow up with [facility] On 02/23/2013. (8:30 am for ECG and lab work, do not have to be fasting)    Contact information:   [address] [place] [address]       Follow up with Hillis Range, MD On 03/11/2013. (at noon)    Specialty:  Cardiology   Contact information:   [address] [place] [address] [phone]       BRING ALL MEDICATIONS WITH YOU TO FOLLOW UP APPOINTMENTS  Time spent with patient to include physician time: 34 min Signed: Theodore Demark, PA-C 02/16/2013, 9:58 AM Co-Sign MD  Patient seen and examined.  Plan as discussed in my rounding note for today and outlined above. Rollene Rotunda  02/16/2013  3:55 PM

## 2013-02-16 NOTE — Care Management Note (Signed)
    Page 1 of 1   02/16/2013     12:03:16 PM   CARE MANAGEMENT NOTE 02/16/2013  Patient:  Splitt,Cortina F   Account Number:  [id number]  Date Initiated:  02/14/2013  Documentation initiated by:  GRAVES-BIGELOW,Alberto Schoch  Subjective/Objective Assessment:   Pt admitted for Tikosyn Load and plans for d/c Monday. Pt states she uses [facility] in [place] and she knows they dispense Tikosyn. Pt did not need benefits check.     Action/Plan:   MD please write Rx for 7 day supply to be filled by the main pharmacy. CM will assist with medicaiton. Pt will need original Rx with refills also.   Anticipated DC Date:  02/16/2013   Anticipated DC Plan:  HOME/SELF CARE      DC Planning Services  CM consult  Medication Assistance      Choice offered to / List presented to:             Status of service:  Completed, signed off Medicare Important Message given?   (If response is "NO", the following Medicare IM given date fields will be blank) Date Medicare IM given:   Date Additional Medicare IM given:    Discharge Disposition:  HOME/SELF CARE  Per UR Regulation:  Reviewed for med. necessity/level of care/duration of stay  If discussed at Long Length of Stay Meetings, dates discussed:    Comments:  02-16-13 1202 Tomi Bamberger, RN,BSN [phone] CM sent 7 day Rx to main pharmacy to fill. No further needs from CM at this time.

## 2013-02-24 ENCOUNTER — Ambulatory Visit (INDEPENDENT_AMBULATORY_CARE_PROVIDER_SITE_OTHER): Payer: Medicare Other | Admitting: *Deleted

## 2013-02-24 VITALS — BP 142/82 | HR 62 | Resp 12 | Ht 69.0 in | Wt 246.0 lb

## 2013-02-24 DIAGNOSIS — Z79899 Other long term (current) drug therapy: Secondary | ICD-10-CM

## 2013-02-24 DIAGNOSIS — I4891 Unspecified atrial fibrillation: Secondary | ICD-10-CM

## 2013-02-24 LAB — BASIC METABOLIC PANEL
BUN: 18 mg/dL (ref 6–23)
Chloride: 104 mEq/L (ref 96–112)
Glucose, Bld: 94 mg/dL (ref 70–99)
Potassium: 4 mEq/L (ref 3.5–5.1)

## 2013-02-24 LAB — MAGNESIUM: Magnesium: 1.9 mg/dL (ref 1.5–2.5)

## 2013-02-24 NOTE — Progress Notes (Signed)
Pt ambulated without assist for nurse visit for EKG/ labs, post tikosyn start.  Pt states she is feeling well.  ekg completed and reviewed by DOD Dr Delton See.  Pt left without incident.

## 2013-03-11 ENCOUNTER — Encounter: Payer: Self-pay | Admitting: Internal Medicine

## 2013-03-11 ENCOUNTER — Ambulatory Visit (INDEPENDENT_AMBULATORY_CARE_PROVIDER_SITE_OTHER): Payer: Medicare Other | Admitting: Internal Medicine

## 2013-03-11 VITALS — BP 145/82 | HR 63 | Ht 69.0 in | Wt 248.0 lb

## 2013-03-11 DIAGNOSIS — I10 Essential (primary) hypertension: Secondary | ICD-10-CM

## 2013-03-11 DIAGNOSIS — I4891 Unspecified atrial fibrillation: Secondary | ICD-10-CM

## 2013-03-11 LAB — MDC_IDC_ENUM_SESS_TYPE_INCLINIC: Implantable Pulse Generator Model: 9529

## 2013-03-11 NOTE — Patient Instructions (Signed)
Your physician recommends that you schedule a follow-up appointment in 3 months with Dr Allred    

## 2013-03-11 NOTE — Progress Notes (Signed)
Primary Cardiologist:  Dr Patty Sermons  Rose Ball is a 67 y.o. female who presents today for electrophysiology followup. Her afib is well controlled now that she is back on tikosyn.  Today, she denies symptoms of chest pain, shortness of breath,  lower extremity edema, dizziness, presyncope, or syncope.  The patient is otherwise without complaint today.   Past Medical History  Diagnosis Date  . Hypertension   . Persistent atrial fibrillation     s/p cardioversion Sept 2012; failed  . Morbid obesity   . Attention deficit     on Vyvanse  . Dyslipidemia   . Palpitations   . Hypokalemia   . Chronic anticoagulation   . Depression    Past Surgical History  Procedure Laterality Date  . Breast biopsy    . Tonsillectomy    . Cardioversion  Sept 2012    failed  . US echocardiography  Aug 2012    Normal systolic function with EF 55% and slight LAE at 4.4cm  . Convergent procedure  11/13/11    afib ablation at [facility]  . Loop recorder insertion Left 09/2011  . Tee without cardioversion N/A 09/08/2012    Procedure: TRANSESOPHAGEAL ECHOCARDIOGRAM (TEE);  Surgeon: Pricilla Riffle, MD;  Location: [facility] ENDOSCOPY;  Service: Cardiovascular;  Laterality: N/A;  . Cardioversion N/A 09/08/2012    Procedure: CARDIOVERSION;  Surgeon: Pricilla Riffle, MD;  Location: [facility] ENDOSCOPY;  Service: Cardiovascular;  Laterality: N/A;  . Tonsillectomy    . Cardioversion N/A 02/15/2013    Procedure: CARDIOVERSION-BEDSIDE;  Surgeon: Cassell Clement, MD;  Location: [facility] OR;  Service: Cardiovascular;  Laterality: N/A;    Current Outpatient Prescriptions  Medication Sig Dispense Refill  . amphetamine-dextroamphetamine (ADDERALL) 15 MG tablet Take 15 mg by mouth daily as needed (for additional focus).       Marland Kitchen atorvastatin (LIPITOR) 10 MG tablet Take 10 mg by mouth daily.      . dabigatran (PRADAXA) 150 MG CAPS Take 150 mg by mouth every 12 (twelve) hours.      . dofetilide (TIKOSYN) 500 MCG capsule Take 1 capsule (500 mcg total)  by mouth 2 (two) times daily.  60 capsule  11  . escitalopram (LEXAPRO) 20 MG tablet Take 20 mg by mouth Daily. Lexapro      . metoprolol tartrate (LOPRESSOR) 50 MG tablet Take 1 tablet (50 mg total) by mouth 2 (two) times daily.  60 tablet  11  . naproxen sodium (ANAPROX) 220 MG tablet Take 440 mg by mouth 2 (two) times daily as needed (for arthritis pain).      . potassium chloride (K-DUR) 10 MEQ tablet Take 1 tablet (10 mEq total) by mouth 2 (two) times a week.  20 tablet  11  . rOPINIRole (REQUIP) 1 MG tablet Take 1 mg by mouth at bedtime.        Marland Kitchen VYVANSE 70 MG capsule Take 70 mg by mouth Daily.        No current facility-administered medications for this visit.    Physical Exam: Filed Vitals:   03/11/13 1204  BP: 145/82  Pulse: 63  Height: 5\' 9"  (1.753 m)  Weight: 248 lb (112.492 kg)    GEN- The patient is well appearing, alert and oriented x 3 today.   Head- normocephalic, atraumatic Eyes-  Sclera clear, conjunctiva pink Ears- hearing intact Oropharynx- clear Lungs- Clear to ausculation bilaterally, normal work of breathing Heart- regular rate and rhythm, no murmurs, rubs or gallops, PMI not laterally displaced GI- soft,  NT, ND, + BS Extremities- no clubbing, cyanosis, or edema  ekg today reveals sinus rhythm 59 bpm, PR 202, Qtc 490, septal infarct  Assessment and Plan:  1. afib She is appropriately anticoagulated Doing well with tikosyn  2. HTN Stable  Return in 3 months

## 2013-05-12 ENCOUNTER — Other Ambulatory Visit: Payer: Self-pay | Admitting: Cardiology

## 2013-06-03 ENCOUNTER — Encounter: Payer: Self-pay | Admitting: Internal Medicine

## 2013-06-03 ENCOUNTER — Ambulatory Visit (INDEPENDENT_AMBULATORY_CARE_PROVIDER_SITE_OTHER): Payer: Medicare Other | Admitting: Internal Medicine

## 2013-06-03 VITALS — BP 140/81 | HR 68 | Ht 69.0 in | Wt 247.0 lb

## 2013-06-03 DIAGNOSIS — I10 Essential (primary) hypertension: Secondary | ICD-10-CM

## 2013-06-03 DIAGNOSIS — Z01810 Encounter for preprocedural cardiovascular examination: Secondary | ICD-10-CM

## 2013-06-03 DIAGNOSIS — I4891 Unspecified atrial fibrillation: Secondary | ICD-10-CM

## 2013-06-03 LAB — BASIC METABOLIC PANEL
BUN: 16 mg/dL (ref 6–23)
CHLORIDE: 107 meq/L (ref 96–112)
CO2: 29 meq/L (ref 19–32)
CREATININE: 1.1 mg/dL (ref 0.4–1.2)
Calcium: 9.1 mg/dL (ref 8.4–10.5)
GFR: 51.14 mL/min — ABNORMAL LOW (ref >60.00)
GLUCOSE: 71 mg/dL (ref 70–99)
Potassium: 4.9 mEq/L (ref 3.5–5.1)
Sodium: 142 mEq/L (ref 135–145)

## 2013-06-03 LAB — CBC WITH DIFFERENTIAL/PLATELET
BASOS ABS: 0 10*3/uL (ref 0.0–0.1)
Basophils Relative: 0.4 % (ref 0.0–3.0)
EOS ABS: 0.2 10*3/uL (ref 0.0–0.7)
Eosinophils Relative: 2.2 % (ref 0.0–5.0)
HCT: 44.1 % (ref 36.0–46.0)
Hemoglobin: 14.5 g/dL (ref 12.0–15.0)
Lymphocytes Relative: 23.1 % (ref 12.0–46.0)
Lymphs Abs: 1.7 10*3/uL (ref 0.7–4.0)
MCHC: 32.8 g/dL (ref 30.0–36.0)
MCV: 85.2 fl (ref 78.0–100.0)
MONO ABS: 0.7 10*3/uL (ref 0.1–1.0)
Monocytes Relative: 10.1 % (ref 3.0–12.0)
NEUTROS ABS: 4.6 10*3/uL (ref 1.4–7.7)
Neutrophils Relative %: 64.2 % (ref 43.0–77.0)
Platelets: 303 10*3/uL (ref 150.0–400.0)
RBC: 5.18 Mil/uL — ABNORMAL HIGH (ref 3.87–5.11)
RDW: 14.2 % (ref 11.5–14.6)
WBC: 7.2 10*3/uL (ref 4.5–10.5)

## 2013-06-03 LAB — MDC_IDC_ENUM_SESS_TYPE_INCLINIC: Implantable Pulse Generator Model: 9529

## 2013-06-03 LAB — MAGNESIUM: Magnesium: 2 mg/dL (ref 1.5–2.5)

## 2013-06-03 NOTE — Patient Instructions (Signed)
Your physician wants you to follow-up in: 6 months with Dr Vallery Ridge will receive a reminder letter in the mail two months in advance. If you don't receive a letter, please call our office to schedule the follow-up appointment.  Your physician recommends that you return for lab work today: BMP/MAG/CBC

## 2013-06-03 NOTE — Progress Notes (Signed)
Primary Cardiologist:  Dr Mare Ferrari  Rose Ball is a 68 y.o. female who presents today for electrophysiology followup. Her afib is well controlled now that she is back on tikosyn.  She is unaware of any episodes.   She has a torn meniscus and has knee surgery planned for a few weeks from now. Today, she denies symptoms of chest pain, shortness of breath,  lower extremity edema, dizziness, presyncope, or syncope.  The patient is otherwise without complaint today.   Past Medical History  Diagnosis Date  . Hypertension   . Persistent atrial fibrillation     s/p cardioversion Sept 2012; failed  . Morbid obesity   . Attention deficit     on Vyvanse  . Dyslipidemia   . Palpitations   . Hypokalemia   . Chronic anticoagulation   . Depression    Past Surgical History  Procedure Laterality Date  . Breast biopsy    . Tonsillectomy    . Cardioversion  Sept 2012    failed  . US echocardiography  Aug 0737    Normal systolic function with EF 55% and slight LAE at 4.4cm  . Convergent procedure  11/13/11    afib ablation at [facility]  . Loop recorder insertion Left 09/2011  . Tee without cardioversion N/A 09/08/2012    Procedure: TRANSESOPHAGEAL ECHOCARDIOGRAM (TEE);  Surgeon: Fay Records, MD;  Location: [facility] ENDOSCOPY;  Service: Cardiovascular;  Laterality: N/A;  . Cardioversion N/A 09/08/2012    Procedure: CARDIOVERSION;  Surgeon: Fay Records, MD;  Location: [facility] ENDOSCOPY;  Service: Cardiovascular;  Laterality: N/A;  . Tonsillectomy    . Cardioversion N/A 02/15/2013    Procedure: CARDIOVERSION-BEDSIDE;  Surgeon: Darlin Coco, MD;  Location: [facility] OR;  Service: Cardiovascular;  Laterality: N/A;    Current Outpatient Prescriptions  Medication Sig Dispense Refill  . amphetamine-dextroamphetamine (ADDERALL) 15 MG tablet Take 15 mg by mouth daily as needed (for additional focus).       Marland Kitchen atorvastatin (LIPITOR) 10 MG tablet Take 10 mg by mouth daily.      . dabigatran (PRADAXA) 150 MG CAPS Take 150  mg by mouth every 12 (twelve) hours.      . dofetilide (TIKOSYN) 500 MCG capsule Take 1 capsule (500 mcg total) by mouth 2 (two) times daily.  60 capsule  11  . escitalopram (LEXAPRO) 20 MG tablet Take 20 mg by mouth Daily. Lexapro      . metoprolol tartrate (LOPRESSOR) 50 MG tablet Take 1 tablet (50 mg total) by mouth 2 (two) times daily.  60 tablet  11  . naproxen sodium (ANAPROX) 220 MG tablet Take 440 mg by mouth 2 (two) times daily as needed (for arthritis pain).      . potassium chloride (K-DUR) 10 MEQ tablet Take 1 tablet (10 mEq total) by mouth 2 (two) times a week.  20 tablet  11  . rOPINIRole (REQUIP) 1 MG tablet Take 1 mg by mouth at bedtime.        Marland Kitchen VYVANSE 70 MG capsule Take 70 mg by mouth Daily.        No current facility-administered medications for this visit.    Physical Exam: Filed Vitals:   06/03/13 1042  BP: 140/81  Pulse: 68  Height: 5\' 9"  (1.753 m)  Weight: 247 lb (112.038 kg)    GEN- The patient is well appearing, alert and oriented x 3 today.   Head- normocephalic, atraumatic Eyes-  Sclera clear, conjunctiva pink Ears- hearing intact Oropharynx- clear Lungs-  Clear to ausculation bilaterally, normal work of breathing Heart- regular rate and rhythm, no murmurs, rubs or gallops, PMI not laterally displaced GI- soft, NT, ND, + BS Extremities- no clubbing, cyanosis, or edema  ekg today reveals sinus rhythm 68 bpm, PR194, Qtc 491, septal infarct  Assessment and Plan:  1. afib She is appropriately anticoagulated Doing well with tikosyn Check CBC, Bmet, Mg today  2. HTN Stable  3. Preop She is planning on knee surgery within the next few weeks.  She can proceed with surgery without any further CV workup required.  I would hold pradaxa 48 hours prior to surgery and resume 24 hours afterwards if hemostasis allows.  I would not want her tikosyn to be interrupted.   Return in 6 months

## 2013-06-18 ENCOUNTER — Other Ambulatory Visit: Payer: Self-pay | Admitting: Cardiology

## 2013-06-19 NOTE — Telephone Encounter (Signed)
Is this ok to refill? Patient has not seen Dr Mare Ferrari in a few years. Please advise. Thanks, MI

## 2013-07-02 ENCOUNTER — Telehealth: Payer: Self-pay | Admitting: *Deleted

## 2013-07-02 NOTE — Telephone Encounter (Signed)
Per patient she no longer sees Dr Mare Ferrari and stated that Dr Rayann Heman is who she sees and that he is the one that needs to refill her lipitor. Is this ok to refill for her? I dont see where she has had her lipids checked recently either. Please advise. Thanks, MI

## 2013-07-03 ENCOUNTER — Other Ambulatory Visit: Payer: Self-pay

## 2013-07-03 MED ORDER — ATORVASTATIN CALCIUM 10 MG PO TABS: 10.0000 mg | ORAL_TABLET | Freq: Every day | ORAL | Status: DC

## 2013-07-07 NOTE — Telephone Encounter (Signed)
ERROR

## 2013-07-08 NOTE — Telephone Encounter (Signed)
Okay to fill  I will let Dr Rayann Heman know she is saying she is not following up with Dr Mare Ferrari any longer  Not sure that's the case but will follow up

## 2013-09-05 ENCOUNTER — Other Ambulatory Visit: Payer: Self-pay | Admitting: Internal Medicine

## 2013-12-03 ENCOUNTER — Other Ambulatory Visit: Payer: Self-pay | Admitting: Internal Medicine

## 2013-12-28 ENCOUNTER — Ambulatory Visit (INDEPENDENT_AMBULATORY_CARE_PROVIDER_SITE_OTHER): Payer: Medicare Other | Admitting: Internal Medicine

## 2013-12-28 ENCOUNTER — Encounter: Payer: Self-pay | Admitting: Internal Medicine

## 2013-12-28 VITALS — BP 138/88 | HR 120 | Ht 69.0 in | Wt 246.4 lb

## 2013-12-28 DIAGNOSIS — I4891 Unspecified atrial fibrillation: Secondary | ICD-10-CM

## 2013-12-28 DIAGNOSIS — I10 Essential (primary) hypertension: Secondary | ICD-10-CM

## 2013-12-28 NOTE — Patient Instructions (Addendum)
Your physician recommends that you schedule a follow-up appointment in: 6 weeks at the [facility] at [place] have been referred to Dr. Eilleen Kempf with [facility]. Please make an appointment with Dr. Lehman Prom at [facility] for atypical atrial flutter.

## 2013-12-28 NOTE — Progress Notes (Signed)
Primary Cardiologist:  Dr Mare Ferrari  Rose Ball is a 68 y.o. female who presents today for electrophysiology followup.  She continues on [place] but has noticed over the last few weeks,   some occasional episodes of jitterness, feeling hot and slightly winded going up steps.  Interrogation of Reveal XT shows  afib/atypical flutter burden at 2.1%  Today, she denies symptoms of chest pain, shortness of breath,  lower extremity edema, dizziness, presyncope, or syncope.  The patient is otherwise without complaint today.   Past Medical History  Diagnosis Date  . Hypertension   . Persistent atrial fibrillation     s/p cardioversion Sept 2012; failed  . Morbid obesity   . Attention deficit     on Vyvanse  . Dyslipidemia   . Palpitations   . Hypokalemia   . Chronic anticoagulation   . Depression    Past Surgical History  Procedure Laterality Date  . Breast biopsy    . Tonsillectomy    . Cardioversion  Sept 2012    failed  . US echocardiography  Aug 6283    Normal systolic function with EF 55% and slight LAE at 4.4cm  . Convergent procedure  11/13/11    afib ablation at [facility]  . Loop recorder insertion Left 09/2011  . Tee without cardioversion N/A 09/08/2012    Procedure: TRANSESOPHAGEAL ECHOCARDIOGRAM (TEE);  Surgeon: Fay Records, MD;  Location: [facility] ENDOSCOPY;  Service: Cardiovascular;  Laterality: N/A;  . Cardioversion N/A 09/08/2012    Procedure: CARDIOVERSION;  Surgeon: Fay Records, MD;  Location: [facility] ENDOSCOPY;  Service: Cardiovascular;  Laterality: N/A;  . Tonsillectomy    . Cardioversion N/A 02/15/2013    Procedure: CARDIOVERSION-BEDSIDE;  Surgeon: Darlin Coco, MD;  Location: [facility] OR;  Service: Cardiovascular;  Laterality: N/A;    Current Outpatient Prescriptions  Medication Sig Dispense Refill  . amphetamine-dextroamphetamine (ADDERALL) 15 MG tablet Take 15 mg by mouth daily as needed (for additional focus).       Marland Kitchen atorvastatin (LIPITOR) 10 MG tablet Take 1 tablet (10 mg  total) by mouth daily.  30 tablet  6  . dabigatran (PRADAXA) 150 MG CAPS Take 150 mg by mouth every 12 (twelve) hours.      . dofetilide (TIKOSYN) 500 MCG capsule Take 1 capsule (500 mcg total) by mouth 2 (two) times daily.  60 capsule  11  . escitalopram (LEXAPRO) 20 MG tablet Take 20 mg by mouth Daily. Lexapro      . metoprolol tartrate (LOPRESSOR) 50 MG tablet Take 1 tablet (50 mg total) by mouth 2 (two) times daily.  60 tablet  11  . naproxen sodium (ANAPROX) 220 MG tablet Take 440 mg by mouth 2 (two) times daily as needed (for arthritis pain).      . potassium chloride (K-DUR) 10 MEQ tablet Take 1 tablet (10 mEq total) by mouth 2 (two) times a week.  20 tablet  11  . rOPINIRole (REQUIP) 1 MG tablet Take 1 mg by mouth at bedtime.        Marland Kitchen VYVANSE 70 MG capsule Take 70 mg by mouth Daily.        No current facility-administered medications for this visit.    Physical Exam: Filed Vitals:   12/28/13 1453  BP: 138/88  Pulse: 120  Height: 5\' 9"  (1.753 m)  Weight: 246 lb 6.4 oz (111.766 kg)    GEN- The patient is well appearing, alert and oriented x 3 today.   Head- normocephalic, atraumatic Eyes-  Sclera clear, conjunctiva pink Ears- hearing intact Oropharynx- clear Lungs- Clear to ausculation bilaterally, normal work of breathing Heart- regular rate and rhythm, no murmurs, rubs or gallops, PMI not laterally displaced GI- soft, NT, ND, + BS Extremities- no clubbing, cyanosis, or edema  ekg today reveals atypical a. flutter  At 108 bpm with QTC 509 ms. See interrogation report. Assessment and Plan:  1. afib She is appropriately anticoagulated.  She appears to be having increased atrial arrhythmias recently.  This is predominantly an atypical atrial flutter.  I will therefore refer back to Dr. Milon Dikes for consideration of additional ablation  2. HTN Stable Bmet/ mg upon return  Return to see me in 6-8 weeks in follow-up post visit to [facility].

## 2013-12-29 LAB — MDC_IDC_ENUM_SESS_TYPE_INCLINIC
Implantable Pulse Generator Model: 9529
MDC IDC SESS DTM: 20150928190719
MDC IDC SET ZONE DETECTION INTERVAL: 2000 ms
MDC IDC SET ZONE DETECTION INTERVAL: 340 ms
Zone Setting Detection Interval: 260 ms
Zone Setting Detection Interval: 3000 ms

## 2014-01-01 ENCOUNTER — Other Ambulatory Visit: Payer: Self-pay | Admitting: Internal Medicine

## 2014-02-01 ENCOUNTER — Other Ambulatory Visit: Payer: Self-pay | Admitting: Internal Medicine

## 2014-02-01 ENCOUNTER — Other Ambulatory Visit: Payer: Self-pay | Admitting: Physician Assistant

## 2014-02-11 ENCOUNTER — Ambulatory Visit: Payer: Medicare Other | Admitting: Internal Medicine

## 2014-02-12 ENCOUNTER — Ambulatory Visit (HOSPITAL_COMMUNITY): Payer: Medicare Other | Admitting: Nurse Practitioner

## 2014-02-15 ENCOUNTER — Other Ambulatory Visit: Payer: Self-pay | Admitting: Physician Assistant

## 2014-03-03 ENCOUNTER — Telehealth: Payer: Self-pay | Admitting: Internal Medicine

## 2014-03-03 NOTE — Telephone Encounter (Signed)
New Msg  Left vm for pt at home to advise of flu clinic.

## 2014-03-10 ENCOUNTER — Encounter (HOSPITAL_COMMUNITY): Payer: Self-pay | Admitting: Internal Medicine

## 2014-03-18 ENCOUNTER — Other Ambulatory Visit: Payer: Self-pay | Admitting: Internal Medicine

## 2014-04-01 ENCOUNTER — Other Ambulatory Visit: Payer: Self-pay | Admitting: Cardiology

## 2014-04-01 ENCOUNTER — Other Ambulatory Visit: Payer: Self-pay | Admitting: Physician Assistant

## 2014-04-01 ENCOUNTER — Other Ambulatory Visit: Payer: Self-pay | Admitting: *Deleted

## 2014-04-19 ENCOUNTER — Other Ambulatory Visit: Payer: Self-pay | Admitting: Internal Medicine

## 2014-05-01 ENCOUNTER — Other Ambulatory Visit: Payer: Self-pay | Admitting: Internal Medicine

## 2014-05-24 ENCOUNTER — Other Ambulatory Visit: Payer: Self-pay | Admitting: Internal Medicine

## 2014-05-24 ENCOUNTER — Other Ambulatory Visit: Payer: Self-pay | Admitting: *Deleted

## 2014-05-24 NOTE — Telephone Encounter (Signed)
Error

## 2014-05-27 ENCOUNTER — Other Ambulatory Visit: Payer: Self-pay

## 2014-05-27 ENCOUNTER — Telehealth: Payer: Self-pay

## 2014-05-27 ENCOUNTER — Ambulatory Visit (INDEPENDENT_AMBULATORY_CARE_PROVIDER_SITE_OTHER): Payer: PPO | Admitting: Nurse Practitioner

## 2014-05-27 ENCOUNTER — Encounter: Payer: Self-pay | Admitting: Nurse Practitioner

## 2014-05-27 DIAGNOSIS — I4891 Unspecified atrial fibrillation: Secondary | ICD-10-CM

## 2014-05-27 DIAGNOSIS — I10 Essential (primary) hypertension: Secondary | ICD-10-CM

## 2014-05-27 LAB — MDC_IDC_ENUM_SESS_TYPE_INCLINIC: Implantable Pulse Generator Model: 9529

## 2014-05-27 LAB — BASIC METABOLIC PANEL
BUN: 16 mg/dL (ref 6–23)
CHLORIDE: 106 meq/L (ref 96–112)
CO2: 26 mEq/L (ref 19–32)
Calcium: 9.4 mg/dL (ref 8.4–10.5)
Creatinine, Ser: 1.09 mg/dL (ref 0.40–1.20)
GFR: 53.15 mL/min — AB (ref >60.00)
Glucose, Bld: 94 mg/dL (ref 70–99)
Potassium: 4.5 mEq/L (ref 3.5–5.1)
SODIUM: 140 meq/L (ref 135–145)

## 2014-05-27 LAB — LIPID PANEL
CHOLESTEROL: 148 mg/dL (ref 0–200)
HDL: 37.9 mg/dL — ABNORMAL LOW (ref >39.00)
LDL Cholesterol: 78 mg/dL (ref 0–99)
NonHDL: 110.1
TRIGLYCERIDES: 162 mg/dL — AB (ref 0.0–149.0)
Total CHOL/HDL Ratio: 4
VLDL: 32.4 mg/dL (ref 0.0–40.0)

## 2014-05-27 LAB — CBC WITH DIFFERENTIAL/PLATELET
BASOS ABS: 0 10*3/uL (ref 0.0–0.1)
Basophils Relative: 0.4 % (ref 0.0–3.0)
EOS PCT: 2.1 % (ref 0.0–5.0)
Eosinophils Absolute: 0.2 10*3/uL (ref 0.0–0.7)
HCT: 42.7 % (ref 36.0–46.0)
Hemoglobin: 14.5 g/dL (ref 12.0–15.0)
LYMPHS ABS: 2.1 10*3/uL (ref 0.7–4.0)
LYMPHS PCT: 23.5 % (ref 12.0–46.0)
MCHC: 33.9 g/dL (ref 30.0–36.0)
MCV: 85.7 fl (ref 78.0–100.0)
MONOS PCT: 8.6 % (ref 3.0–12.0)
Monocytes Absolute: 0.8 10*3/uL (ref 0.1–1.0)
Neutro Abs: 5.9 10*3/uL (ref 1.4–7.7)
Neutrophils Relative %: 65.4 % (ref 43.0–77.0)
PLATELETS: 303 10*3/uL (ref 150.0–400.0)
RBC: 4.99 Mil/uL (ref 3.87–5.11)
RDW: 14 % (ref 11.5–15.5)
WBC: 9 10*3/uL (ref 4.0–10.5)

## 2014-05-27 LAB — HEPATIC FUNCTION PANEL
ALK PHOS: 131 U/L — AB (ref 39–117)
ALT: 13 U/L (ref 0–35)
AST: 20 U/L (ref 0–37)
Albumin: 3.9 g/dL (ref 3.5–5.2)
BILIRUBIN DIRECT: 0.1 mg/dL (ref 0.0–0.3)
BILIRUBIN TOTAL: 0.4 mg/dL (ref 0.2–1.2)
Total Protein: 6.8 g/dL (ref 6.0–8.3)

## 2014-05-27 LAB — MAGNESIUM: MAGNESIUM: 2 mg/dL (ref 1.5–2.5)

## 2014-05-27 LAB — TSH: TSH: 1.07 u[IU]/mL (ref 0.35–4.50)

## 2014-05-27 LAB — T4: T4, Total: 8.3 ug/dL (ref 4.5–12.0)

## 2014-05-27 LAB — T3, FREE: T3, Free: 3.2 pg/mL (ref 2.3–4.2)

## 2014-05-27 MED ORDER — DOFETILIDE 500 MCG PO CAPS: 500.0000 ug | ORAL_CAPSULE | Freq: Two times a day (BID) | ORAL | Status: DC

## 2014-05-27 MED ORDER — DABIGATRAN ETEXILATE MESYLATE 150 MG PO CAPS
150.0000 mg | ORAL_CAPSULE | Freq: Two times a day (BID) | ORAL | Status: DC
Start: 2014-05-27 — End: 2014-06-25

## 2014-05-27 MED ORDER — METOPROLOL TARTRATE 50 MG PO TABS: 50.0000 mg | ORAL_TABLET | Freq: Two times a day (BID) | ORAL | Status: DC

## 2014-05-27 NOTE — Progress Notes (Signed)
Primary Cardiologist:  Dr Mare Ferrari  DARSHA ZUMSTEIN is a 69 y.o. female who presents today for electrophysiology followup. H/o convergent procedure [facility] 8/13. She does not notice irregular heart beat.  She continues on [place] but has ongoing  episodes of jitterness, feeling hot. She does not have an internist, had an appointment scheduled but cancelled. Encouraged to get rescheduled.  Interrogation of Reveal XT shows  afib burden at 1.4%. Her last episode of jitteriness this past Tuesday did not correlate with any arrhythmia per interrogation. She does have h/o sleep apnea, has never used cpap. Being compliant with tikosyn and pradaxa.  On last visit, she appeared to have an atypical a.flutter and she was referred to Dr. Lehman Prom to see if another ablation would be needed, however, pt did not feel the need to schedule the appointment. In SR today.   Today, she denies symptoms of chest pain, shortness of breath,  lower extremity edema, dizziness, presyncope, or syncope.  The patient is otherwise without complaint today.   Past Medical History  Diagnosis Date  . Hypertension   . Persistent atrial fibrillation 05/27/14 Chadsvasc score of 4    s/p cardioversion Sept 2012; failed  . Morbid obesity   . Attention deficit     on Vyvanse  . Dyslipidemia   . Palpitations   . Hypokalemia   . Chronic anticoagulation   . Depression    Past Surgical History  Procedure Laterality Date  . Breast biopsy    . Tonsillectomy    . Cardioversion  Sept 2012    failed  . US echocardiography  Aug 7829    Normal systolic function with EF 55% and slight LAE at 4.4cm  . Convergent procedure  11/13/11    afib ablation at [facility]  . Loop recorder insertion Left 09/2011  . Tee without cardioversion N/A 09/08/2012    Procedure: TRANSESOPHAGEAL ECHOCARDIOGRAM (TEE);  Surgeon: Fay Records, MD;  Location: [facility] ENDOSCOPY;  Service: Cardiovascular;  Laterality: N/A;  . Cardioversion N/A 09/08/2012    Procedure:  CARDIOVERSION;  Surgeon: Fay Records, MD;  Location: [facility] ENDOSCOPY;  Service: Cardiovascular;  Laterality: N/A;  . Tonsillectomy    . Cardioversion N/A 02/15/2013    Procedure: CARDIOVERSION-BEDSIDE;  Surgeon: Darlin Coco, MD;  Location: [place];  Service: Cardiovascular;  Laterality: N/A;  . Cardioversion N/A 02/05/2011    Procedure: CARDIOVERSION;  Surgeon: Evans Lance, MD;  Location: [facility] CATH LAB;  Service: Cardiovascular;  Laterality: N/A;    Current Outpatient Prescriptions  Medication Sig Dispense Refill  . amphetamine-dextroamphetamine (ADDERALL) 15 MG tablet Take 15 mg by mouth daily as needed (for additional focus).     Marland Kitchen atorvastatin (LIPITOR) 10 MG tablet TAKE 1 TABLET (10 MG TOTAL) BY MOUTH DAILY. 30 tablet 6  . dabigatran (PRADAXA) 150 MG CAPS capsule Take 1 capsule (150 mg total) by mouth 2 (two) times daily. 60 capsule 0  . dofetilide (TIKOSYN) 500 MCG capsule Take 1 capsule (500 mcg total) by mouth 2 (two) times daily. 90 capsule 3  . escitalopram (LEXAPRO) 20 MG tablet Take 20 mg by mouth Daily. Lexapro    . KLOR-CON M10 10 MEQ tablet TAKE 1 TABLET (10 MEQ TOTAL) BY MOUTH 2 (TWO) TIMES A WEEK. 20 tablet 5  . metoprolol (LOPRESSOR) 50 MG tablet Take 1 tablet (50 mg total) by mouth 2 (two) times daily. 90 tablet 3  . naproxen sodium (ANAPROX) 220 MG tablet Take 440 mg by mouth 2 (two) times  daily as needed (for arthritis pain).    Marland Kitchen rOPINIRole (REQUIP) 1 MG tablet Take 1 mg by mouth at bedtime.      Marland Kitchen VYVANSE 70 MG capsule Take 70 mg by mouth Daily.      No current facility-administered medications for this visit.    Physical Exam: Filed Vitals:   05/27/14 1507  BP: 138/88  Pulse: 69  Height: 5\' 9"  (1.753 m)  Weight: 257 lb 9.6 oz (116.847 kg)    GEN- The patient is well appearing, alert and oriented x 3 today.   Head- normocephalic, atraumatic Eyes-  Sclera clear, conjunctiva pink Ears- hearing intact Oropharynx- clear Lungs- Clear to ausculation bilaterally,  normal work of breathing Heart- regular rate and rhythm, no murmurs, rubs or gallops, PMI not laterally displaced GI- soft, NT, ND, + BS Extremities- no clubbing, cyanosis, or edema  ekg today reveals SR 69 bpm, QTc at 475 ms See interrogation report. Afib burden 1.4%.  Assessment and Plan:  1. afib She is appropriately anticoagulated. Low afib burden by her report and interogation of ILR.  Continue tikosyn with refills today. Bmet/mag today.  2. HTN Stable  3. Hyperlidemia On lipitor Liv/Lipid today,  will be non fasting  4. Chadsvasc score of at least 4  Continue pradaxa CBC /bmet today due to pt not having primary to periodically draw labs.  5. Shaky/nervous spells Thyroid panel today.  6. Encouraged to establish with primary care physician.  F/u with Dr. Rayann Heman in 4 months.

## 2014-05-27 NOTE — Patient Instructions (Signed)
Your physician wants you to follow-up in: 4 months with Dr. Vallery Ridge will receive a reminder letter in the mail two months in advance. If you don't receive a letter, please call our office to schedule the follow-up appointment.  Your physician recommends that you return for lab work today: cbc, bmet, magnesium, lipid, liver, tsh

## 2014-05-28 ENCOUNTER — Telehealth: Payer: Self-pay | Admitting: *Deleted

## 2014-05-28 NOTE — Telephone Encounter (Signed)
Left message for patient to call back regarding lab results.

## 2014-05-28 NOTE — Telephone Encounter (Signed)
-----  Message from Roderic Palau, NP sent at 05/28/2014  9:15 AM EST ----- Please inform labs look OK except for elevated alk phos. Cannot find old liver panel to compare. Repeat liver panel in one month, encourage strongly for pt to reschedule appointment and establish with primary. LDL looks good, Trigs up slightly, try to cut back of white foods, simple sugars.

## 2014-06-01 NOTE — Telephone Encounter (Signed)
Patient informed of lab results. States she will try to make an appointment to see a primary physician sometime this week or at least get established with one. If she cannot then she will call back to schedule her lab appointment for repeat liver panel

## 2014-06-22 ENCOUNTER — Other Ambulatory Visit: Payer: Self-pay | Admitting: Nurse Practitioner

## 2014-06-25 ENCOUNTER — Other Ambulatory Visit: Payer: Self-pay

## 2014-06-25 MED ORDER — DABIGATRAN ETEXILATE MESYLATE 150 MG PO CAPS: 150.0000 mg | ORAL_CAPSULE | Freq: Two times a day (BID) | ORAL | Status: DC

## 2014-06-28 ENCOUNTER — Other Ambulatory Visit (HOSPITAL_COMMUNITY): Payer: Self-pay

## 2014-06-28 MED ORDER — DABIGATRAN ETEXILATE MESYLATE 150 MG PO CAPS: 150.0000 mg | ORAL_CAPSULE | Freq: Two times a day (BID) | ORAL | Status: DC

## 2014-09-03 ENCOUNTER — Other Ambulatory Visit: Payer: Self-pay | Admitting: Internal Medicine

## 2014-09-03 NOTE — Telephone Encounter (Signed)
Per 2.25.16

## 2014-09-27 ENCOUNTER — Other Ambulatory Visit: Payer: Self-pay

## 2014-11-02 ENCOUNTER — Other Ambulatory Visit: Payer: Self-pay | Admitting: Nurse Practitioner

## 2014-11-05 ENCOUNTER — Other Ambulatory Visit: Payer: Self-pay | Admitting: Nurse Practitioner

## 2014-11-19 ENCOUNTER — Other Ambulatory Visit: Payer: Self-pay | Admitting: Nurse Practitioner

## 2014-12-07 ENCOUNTER — Encounter: Payer: Self-pay | Admitting: *Deleted

## 2014-12-15 ENCOUNTER — Other Ambulatory Visit (HOSPITAL_COMMUNITY): Payer: Self-pay | Admitting: *Deleted

## 2014-12-15 MED ORDER — DOFETILIDE 500 MCG PO CAPS: 500.0000 ug | ORAL_CAPSULE | Freq: Two times a day (BID) | ORAL | Status: DC

## 2014-12-28 ENCOUNTER — Encounter: Payer: Self-pay | Admitting: Internal Medicine

## 2014-12-29 ENCOUNTER — Encounter: Payer: Self-pay | Admitting: *Deleted

## 2015-01-21 ENCOUNTER — Other Ambulatory Visit (HOSPITAL_COMMUNITY): Payer: Self-pay | Admitting: Nurse Practitioner

## 2015-01-29 ENCOUNTER — Other Ambulatory Visit: Payer: Self-pay | Admitting: Internal Medicine

## 2015-02-02 ENCOUNTER — Encounter: Payer: Self-pay | Admitting: Internal Medicine

## 2015-02-02 ENCOUNTER — Ambulatory Visit (INDEPENDENT_AMBULATORY_CARE_PROVIDER_SITE_OTHER): Payer: PPO | Admitting: Internal Medicine

## 2015-02-02 VITALS — BP 132/70 | HR 56 | Ht 69.0 in | Wt 211.8 lb

## 2015-02-02 DIAGNOSIS — I481 Persistent atrial fibrillation: Secondary | ICD-10-CM

## 2015-02-02 DIAGNOSIS — Z0000 Encounter for general adult medical examination without abnormal findings: Secondary | ICD-10-CM

## 2015-02-02 DIAGNOSIS — I10 Essential (primary) hypertension: Secondary | ICD-10-CM | POA: Diagnosis not present

## 2015-02-02 DIAGNOSIS — I4891 Unspecified atrial fibrillation: Secondary | ICD-10-CM | POA: Diagnosis not present

## 2015-02-02 DIAGNOSIS — Z45018 Encounter for adjustment and management of other part of cardiac pacemaker: Secondary | ICD-10-CM | POA: Diagnosis not present

## 2015-02-02 DIAGNOSIS — E785 Hyperlipidemia, unspecified: Secondary | ICD-10-CM

## 2015-02-02 DIAGNOSIS — I4819 Other persistent atrial fibrillation: Secondary | ICD-10-CM

## 2015-02-02 LAB — CBC WITH DIFFERENTIAL/PLATELET
BASOS ABS: 0.1 10*3/uL (ref 0.0–0.1)
BASOS PCT: 1 % (ref 0–1)
EOS ABS: 0.2 10*3/uL (ref 0.0–0.7)
Eosinophils Relative: 3 % (ref 0–5)
HCT: 42.1 % (ref 36.0–46.0)
Hemoglobin: 13.8 g/dL (ref 12.0–15.0)
Lymphocytes Relative: 25 % (ref 12–46)
Lymphs Abs: 1.3 10*3/uL (ref 0.7–4.0)
MCH: 28.7 pg (ref 26.0–34.0)
MCHC: 32.8 g/dL (ref 30.0–36.0)
MCV: 87.5 fL (ref 78.0–100.0)
MPV: 10.1 fL (ref 8.6–12.4)
Monocytes Absolute: 0.6 10*3/uL (ref 0.1–1.0)
Monocytes Relative: 12 % (ref 3–12)
NEUTROS PCT: 59 % (ref 43–77)
Neutro Abs: 3.1 10*3/uL (ref 1.7–7.7)
PLATELETS: 233 10*3/uL (ref 150–400)
RBC: 4.81 MIL/uL (ref 3.87–5.11)
RDW: 13.7 % (ref 11.5–15.5)
WBC: 5.3 10*3/uL (ref 4.0–10.5)

## 2015-02-02 LAB — CUP PACEART INCLINIC DEVICE CHECK: Date Time Interrogation Session: 20161102154953

## 2015-02-02 LAB — BASIC METABOLIC PANEL
BUN: 22 mg/dL (ref 7–25)
CALCIUM: 9.1 mg/dL (ref 8.6–10.4)
CHLORIDE: 103 mmol/L (ref 98–110)
CO2: 24 mmol/L (ref 20–31)
CREATININE: 1.22 mg/dL — AB (ref 0.50–0.99)
Glucose, Bld: 76 mg/dL (ref 65–99)
Potassium: 4.2 mmol/L (ref 3.5–5.3)
Sodium: 138 mmol/L (ref 135–146)

## 2015-02-02 LAB — MAGNESIUM: MAGNESIUM: 1.9 mg/dL (ref 1.5–2.5)

## 2015-02-02 NOTE — Patient Instructions (Addendum)
Medication Instructions:  Your physician recommends that you continue on your current medications as directed. Please refer to the Current Medication list given to you today.   Labwork: Your physician recommends that you return for lab work today: BMP/CBC/MAG   Testing/Procedures: Loop recorder explant on 02/08/15 Be at [facility] [place] to eat breakfast Nothing after 8:00am  Follow-Up: Your physician recommends that you schedule a follow-up appointment in: 7-10 days from 02/09/15 in device clinic for wound check  Your physician wants you to follow-up in: 6 months with Roderic Palau, NP You will receive a reminder letter in the mail two months in advance. If you don't receive a letter, please call our office to schedule the follow-up appointment.  You have been referred to Arnaldo Natal for PCP    Any Other Special Instructions Will Be Listed Below (If Applicable).     If you need a refill on your cardiac medications before your next appointment, please call your pharmacy.

## 2015-02-02 NOTE — Progress Notes (Signed)
Primary Cardiologist:  Dr Mare Ferrari  Rose Ball is a 69 y.o. female who presents today for electrophysiology followup.  AF is well controlled.  Interrogation of Reveal XT shows afib/atypical flutter burden at  1.4%.  Today, she denies symptoms of chest pain, shortness of breath,  lower extremity edema, dizziness, presyncope, or syncope.  The patient is otherwise without complaint today.   Past Medical History  Diagnosis Date  . Hypertension   . Persistent atrial fibrillation ([place]) 05/27/14 Chadsvasc score of 4    s/p cardioversion Sept 2012; failed  . Morbid obesity ([place])   . Attention deficit     on Vyvanse  . Dyslipidemia   . Palpitations   . Hypokalemia   . Chronic anticoagulation   . Depression    Past Surgical History  Procedure Laterality Date  . Breast biopsy    . Tonsillectomy    . Cardioversion  Sept 2012    failed  . US echocardiography  Aug 9379    Normal systolic function with EF 55% and slight LAE at 4.4cm  . Convergent procedure  11/13/11    afib ablation at [facility]  . Loop recorder insertion Left 09/2011  . Tee without cardioversion N/A 09/08/2012    Procedure: TRANSESOPHAGEAL ECHOCARDIOGRAM (TEE);  Surgeon: Fay Records, MD;  Location: [facility] ENDOSCOPY;  Service: Cardiovascular;  Laterality: N/A;  . Cardioversion N/A 09/08/2012    Procedure: CARDIOVERSION;  Surgeon: Fay Records, MD;  Location: [facility] ENDOSCOPY;  Service: Cardiovascular;  Laterality: N/A;  . Tonsillectomy    . Cardioversion N/A 02/15/2013    Procedure: CARDIOVERSION-BEDSIDE;  Surgeon: Darlin Coco, MD;  Location: [place];  Service: Cardiovascular;  Laterality: N/A;  . Cardioversion N/A 02/05/2011    Procedure: CARDIOVERSION;  Surgeon: Evans Lance, MD;  Location: [facility] CATH LAB;  Service: Cardiovascular;  Laterality: N/A;    Current Outpatient Prescriptions  Medication Sig Dispense Refill  . amphetamine-dextroamphetamine (ADDERALL) 15 MG tablet Take 15 mg by mouth daily as needed (for additional  focus).     Marland Kitchen atorvastatin (LIPITOR) 10 MG tablet TAKE 1 TABLET (10 MG TOTAL) BY MOUTH DAILY. 30 tablet 6  . dabigatran (PRADAXA) 150 MG CAPS capsule Take 1 capsule (150 mg total) by mouth 2 (two) times daily. 60 capsule 6  . dofetilide (TIKOSYN) 500 MCG capsule TAKE ONE CAPSULE BY MOUTH TWICE A DAY 60 capsule 0  . escitalopram (LEXAPRO) 20 MG tablet Take 20 mg by mouth Daily. Lexapro    . KLOR-CON M10 10 MEQ tablet TAKE 1 TABLET (10 MEQ TOTAL) BY MOUTH 2 (TWO) TIMES A WEEK. 20 tablet 5  . metoprolol (LOPRESSOR) 50 MG tablet TAKE 1 TABLET (50 MG TOTAL) BY MOUTH 2 (TWO) TIMES DAILY. 120 tablet 3  . naproxen sodium (ANAPROX) 220 MG tablet Take 440 mg by mouth 2 (two) times daily as needed (for arthritis pain).    Marland Kitchen rOPINIRole (REQUIP) 1 MG tablet Take 1 mg by mouth at bedtime.      Marland Kitchen VYVANSE 70 MG capsule Take 70 mg by mouth Daily.      No current facility-administered medications for this visit.    Physical Exam: Filed Vitals:   02/02/15 1136  BP: 132/70  Pulse: 56  Height: 5\' 9"  (1.753 m)  Weight: 211 lb 12.8 oz (96.072 kg)    GEN- The patient is well appearing, alert and oriented x 3 today.   Head- normocephalic, atraumatic Eyes-  Sclera clear, conjunctiva pink Ears- hearing intact Oropharynx- clear Lungs- Clear  to ausculation bilaterally, normal work of breathing Heart- regular rate and rhythm, no murmurs, rubs or gallops, PMI not laterally displaced GI- soft, NT, ND, + BS Extremities- no clubbing, cyanosis, or edema  ekg today reveals sinus bradycardia 56 bpm, first degree AV block 220 msec, Qtc 496 msec  See interrogation report. Assessment and Plan:  1. afib She is appropriately anticoagulated.  AFib/ atypical flutter are well controlled Check bmet, mg today ILR is at EOL.  Risks, benefits, and alternatives to device removal were discussed with patient who wishes to proceed.  2. HTN Stable  3. HL She is not fasting today She needs to establish with primary care  for lipids, colon screening, etc.  I have referred to [facility] today.  4. Overweight I am pleased that she continues to lose weight  Follow-up with Roderic Palau NP in the AF clinic in 6 months  [name] MD, [facility] 02/02/2015 11:57 AM

## 2015-02-09 ENCOUNTER — Encounter (HOSPITAL_COMMUNITY)
Admission: RE | Disposition: A | Discharge status: Home / Self Care | Payer: Self-pay | Source: Ambulatory Visit | Attending: Internal Medicine

## 2015-02-09 ENCOUNTER — Encounter (HOSPITAL_COMMUNITY): Payer: Self-pay | Admitting: Internal Medicine

## 2015-02-09 ENCOUNTER — Ambulatory Visit (HOSPITAL_COMMUNITY)
Admission: RE | Admit: 2015-02-09 | Discharge: 2015-02-09 | Disposition: A | Discharge status: Home / Self Care | Payer: PPO | Source: Ambulatory Visit | Attending: Internal Medicine | Admitting: Internal Medicine

## 2015-02-09 DIAGNOSIS — I4892 Unspecified atrial flutter: Secondary | ICD-10-CM | POA: Diagnosis not present

## 2015-02-09 DIAGNOSIS — E876 Hypokalemia: Secondary | ICD-10-CM | POA: Diagnosis not present

## 2015-02-09 DIAGNOSIS — I10 Essential (primary) hypertension: Secondary | ICD-10-CM | POA: Insufficient documentation

## 2015-02-09 DIAGNOSIS — E785 Hyperlipidemia, unspecified: Secondary | ICD-10-CM | POA: Diagnosis not present

## 2015-02-09 DIAGNOSIS — F329 Major depressive disorder, single episode, unspecified: Secondary | ICD-10-CM | POA: Diagnosis not present

## 2015-02-09 DIAGNOSIS — Z7901 Long term (current) use of anticoagulants: Secondary | ICD-10-CM | POA: Diagnosis not present

## 2015-02-09 DIAGNOSIS — I481 Persistent atrial fibrillation: Secondary | ICD-10-CM | POA: Insufficient documentation

## 2015-02-09 DIAGNOSIS — Z0000 Encounter for general adult medical examination without abnormal findings: Secondary | ICD-10-CM

## 2015-02-09 DIAGNOSIS — Z45018 Encounter for adjustment and management of other part of cardiac pacemaker: Secondary | ICD-10-CM

## 2015-02-09 DIAGNOSIS — I4891 Unspecified atrial fibrillation: Secondary | ICD-10-CM

## 2015-02-09 HISTORY — PX: OTHER SURGICAL HISTORY: SHX169

## 2015-02-09 HISTORY — PX: EP IMPLANTABLE DEVICE: SHX172B

## 2015-02-09 LAB — SURGICAL PCR SCREEN
MRSA, PCR: NEGATIVE
STAPHYLOCOCCUS AUREUS: POSITIVE — AB

## 2015-02-09 SURGERY — LOOP RECORDER REMOVAL
Anesthesia: LOCAL

## 2015-02-09 MED ORDER — SODIUM CHLORIDE 0.9 % IR SOLN
80.0000 mg | Status: AC
  Administered 2015-02-09: 80 mg

## 2015-02-09 MED ORDER — SODIUM CHLORIDE 0.9 % IV SOLN
INTRAVENOUS | Status: DC
  Administered 2015-02-09: 1000 mL via INTRAVENOUS

## 2015-02-09 MED ORDER — SODIUM CHLORIDE 0.9 % IR SOLN
Status: AC
  Filled 2015-02-09: qty 2

## 2015-02-09 MED ORDER — LIDOCAINE HCL (PF) 1 % IJ SOLN
INTRAMUSCULAR | Status: DC | PRN
  Administered 2015-02-09: 31 mL

## 2015-02-09 MED ORDER — CEFAZOLIN SODIUM-DEXTROSE 2-3 GM-% IV SOLR
2.0000 g | INTRAVENOUS | Status: AC
  Administered 2015-02-09: 2 g via INTRAVENOUS

## 2015-02-09 MED ORDER — MUPIROCIN 2 % EX OINT
TOPICAL_OINTMENT | CUTANEOUS | Status: AC
  Administered 2015-02-09: 1
  Filled 2015-02-09: qty 22

## 2015-02-09 MED ORDER — LIDOCAINE HCL (PF) 1 % IJ SOLN
INTRAMUSCULAR | Status: AC
  Filled 2015-02-09: qty 30

## 2015-02-09 MED ORDER — CHLORHEXIDINE GLUCONATE 4 % EX LIQD
60.0000 mL | Freq: Once | CUTANEOUS | Status: DC
  Filled 2015-02-09: qty 60

## 2015-02-09 SURGICAL SUPPLY — 1 items: TRAY PACEMAKER INSERTION (CUSTOM PROCEDURE TRAY) ×1 IMPLANT

## 2015-02-09 NOTE — Interval H&P Note (Signed)
History and Physical Interval Note:  02/09/2015 4:16 PM  Rose Ball  has presented today for surgery, with the diagnosis of EOL  The various methods of treatment have been discussed with the patient and family. After consideration of risks, benefits and other options for treatment, the patient has consented to  Procedure(s): Loop Recorder Removal (N/A) as a surgical intervention .  The patient's history has been reviewed, patient examined, no change in status, stable for surgery.  I have reviewed the patient's chart and labs.  Questions were answered to the patient's satisfaction.     [name]

## 2015-02-09 NOTE — H&P (View-Only) (Signed)
Primary Cardiologist:  Dr Mare Ferrari  Rose Ball is a 69 y.o. female who presents today for electrophysiology followup.  AF is well controlled.  Interrogation of Reveal XT shows afib/atypical flutter burden at  1.4%.  Today, she denies symptoms of chest pain, shortness of breath,  lower extremity edema, dizziness, presyncope, or syncope.  The patient is otherwise without complaint today.   Past Medical History  Diagnosis Date  . Hypertension   . Persistent atrial fibrillation ([place]) 05/27/14 Chadsvasc score of 4    s/p cardioversion Sept 2012; failed  . Morbid obesity ([place])   . Attention deficit     on Vyvanse  . Dyslipidemia   . Palpitations   . Hypokalemia   . Chronic anticoagulation   . Depression    Past Surgical History  Procedure Laterality Date  . Breast biopsy    . Tonsillectomy    . Cardioversion  Sept 2012    failed  . US echocardiography  Aug 8185    Normal systolic function with EF 55% and slight LAE at 4.4cm  . Convergent procedure  11/13/11    afib ablation at [facility]  . Loop recorder insertion Left 09/2011  . Tee without cardioversion N/A 09/08/2012    Procedure: TRANSESOPHAGEAL ECHOCARDIOGRAM (TEE);  Surgeon: Fay Records, MD;  Location: [facility] ENDOSCOPY;  Service: Cardiovascular;  Laterality: N/A;  . Cardioversion N/A 09/08/2012    Procedure: CARDIOVERSION;  Surgeon: Fay Records, MD;  Location: [facility] ENDOSCOPY;  Service: Cardiovascular;  Laterality: N/A;  . Tonsillectomy    . Cardioversion N/A 02/15/2013    Procedure: CARDIOVERSION-BEDSIDE;  Surgeon: Darlin Coco, MD;  Location: [place];  Service: Cardiovascular;  Laterality: N/A;  . Cardioversion N/A 02/05/2011    Procedure: CARDIOVERSION;  Surgeon: Evans Lance, MD;  Location: [facility] CATH LAB;  Service: Cardiovascular;  Laterality: N/A;    Current Outpatient Prescriptions  Medication Sig Dispense Refill  . amphetamine-dextroamphetamine (ADDERALL) 15 MG tablet Take 15 mg by mouth daily as needed (for additional  focus).     Marland Kitchen atorvastatin (LIPITOR) 10 MG tablet TAKE 1 TABLET (10 MG TOTAL) BY MOUTH DAILY. 30 tablet 6  . dabigatran (PRADAXA) 150 MG CAPS capsule Take 1 capsule (150 mg total) by mouth 2 (two) times daily. 60 capsule 6  . dofetilide (TIKOSYN) 500 MCG capsule TAKE ONE CAPSULE BY MOUTH TWICE A DAY 60 capsule 0  . escitalopram (LEXAPRO) 20 MG tablet Take 20 mg by mouth Daily. Lexapro    . KLOR-CON M10 10 MEQ tablet TAKE 1 TABLET (10 MEQ TOTAL) BY MOUTH 2 (TWO) TIMES A WEEK. 20 tablet 5  . metoprolol (LOPRESSOR) 50 MG tablet TAKE 1 TABLET (50 MG TOTAL) BY MOUTH 2 (TWO) TIMES DAILY. 120 tablet 3  . naproxen sodium (ANAPROX) 220 MG tablet Take 440 mg by mouth 2 (two) times daily as needed (for arthritis pain).    Marland Kitchen rOPINIRole (REQUIP) 1 MG tablet Take 1 mg by mouth at bedtime.      Marland Kitchen VYVANSE 70 MG capsule Take 70 mg by mouth Daily.      No current facility-administered medications for this visit.    Physical Exam: Filed Vitals:   02/02/15 1136  BP: 132/70  Pulse: 56  Height: 5\' 9"  (1.753 m)  Weight: 211 lb 12.8 oz (96.072 kg)    GEN- The patient is well appearing, alert and oriented x 3 today.   Head- normocephalic, atraumatic Eyes-  Sclera clear, conjunctiva pink Ears- hearing intact Oropharynx- clear Lungs- Clear  to ausculation bilaterally, normal work of breathing Heart- regular rate and rhythm, no murmurs, rubs or gallops, PMI not laterally displaced GI- soft, NT, ND, + BS Extremities- no clubbing, cyanosis, or edema  ekg today reveals sinus bradycardia 56 bpm, first degree AV block 220 msec, Qtc 496 msec  See interrogation report. Assessment and Plan:  1. afib She is appropriately anticoagulated.  AFib/ atypical flutter are well controlled Check bmet, mg today ILR is at EOL.  Risks, benefits, and alternatives to device removal were discussed with patient who wishes to proceed.  2. HTN Stable  3. HL She is not fasting today She needs to establish with primary care  for lipids, colon screening, etc.  I have referred to [facility] today.  4. Overweight I am pleased that she continues to lose weight  Follow-up with Roderic Palau NP in the AF clinic in 6 months  [name] MD, [facility] 02/02/2015 11:57 AM

## 2015-02-09 NOTE — Discharge Instructions (Signed)
Call Dr Jackalyn Lombard office if any problems,questions, or concerns; call if any bleeding,drainage,fever,pain,swelling, or redness at site

## 2015-02-10 ENCOUNTER — Encounter (HOSPITAL_COMMUNITY): Payer: Self-pay | Admitting: Internal Medicine

## 2015-02-14 ENCOUNTER — Telehealth: Payer: Self-pay | Admitting: Behavioral Health

## 2015-02-14 NOTE — Telephone Encounter (Signed)
Patient verbalized that she was unaware that this appointment had been scheduled and will not be able to make it. She's not sure about rescheduling for a later date. Appointment has been cancelled for 02/15/15 at 1:30 PM.

## 2015-02-14 NOTE — Telephone Encounter (Signed)
Unable to reach patient at time of Pre-Visit Call.  Left message for patient to return call when available.    

## 2015-02-15 ENCOUNTER — Ambulatory Visit: Payer: PPO | Admitting: Physician Assistant

## 2015-02-17 ENCOUNTER — Ambulatory Visit (INDEPENDENT_AMBULATORY_CARE_PROVIDER_SITE_OTHER): Payer: PPO | Admitting: *Deleted

## 2015-02-17 DIAGNOSIS — I4891 Unspecified atrial fibrillation: Secondary | ICD-10-CM

## 2015-02-17 LAB — CUP PACEART INCLINIC DEVICE CHECK
Date Time Interrogation Session: 20161117161331
Pulse Gen Model: 9529

## 2015-02-17 NOTE — Progress Notes (Signed)
Wound check appointment status post loop recorder explant on 02/09/15. Steri-strips removed. Wound without redness or edema. Small unapproximated area on the right lateral incision site with small amount of yellow drainage on steri-strips. No additional drainage after steri-strips removed. Per instructions from Dr. Acie Fredrickson, [place], who viewed wound, applied antibiotic ointment, steri-strips, and bandage to cover. Patient aware to keep site dry and to call with signs/symptoms of infection. Patient given additional bandages to allow for changes at home. ROV with device clinic on 02/21/15 at 4:30pm for wound recheck.

## 2015-02-21 ENCOUNTER — Ambulatory Visit (INDEPENDENT_AMBULATORY_CARE_PROVIDER_SITE_OTHER): Payer: PPO | Admitting: *Deleted

## 2015-02-21 DIAGNOSIS — Z9889 Other specified postprocedural states: Secondary | ICD-10-CM

## 2015-02-21 NOTE — Progress Notes (Signed)
Pt presents to the device clinic for wound re-check s/p loop recorder explant. Steri-strips removed. Wound edges approximated. No redness, swelling or drainage. Dr. Rayann Heman visualized incision- no further intervention deemed necessary. Pt instructed to contact device clinic if she develops symptoms of redness, swelling, drainage, fever or chills. Pt agreeable. ROV with AFib Clinic in May.

## 2015-02-25 ENCOUNTER — Other Ambulatory Visit (HOSPITAL_COMMUNITY): Payer: Self-pay | Admitting: Nurse Practitioner

## 2015-03-04 ENCOUNTER — Encounter: Payer: Self-pay | Admitting: Internal Medicine

## 2015-03-21 ENCOUNTER — Telehealth: Payer: Self-pay

## 2015-03-21 ENCOUNTER — Other Ambulatory Visit: Payer: Self-pay

## 2015-03-21 NOTE — Telephone Encounter (Signed)
Completed and faxed today

## 2015-03-21 NOTE — Telephone Encounter (Signed)
New problem    Want to know status of prior auth. Please advise

## 2015-03-22 ENCOUNTER — Telehealth: Payer: Self-pay

## 2015-03-22 NOTE — Telephone Encounter (Signed)
Tikosyn approved through 04/01/2016.

## 2015-03-27 ENCOUNTER — Other Ambulatory Visit: Payer: Self-pay | Admitting: Internal Medicine

## 2015-04-07 ENCOUNTER — Other Ambulatory Visit: Payer: Self-pay | Admitting: Internal Medicine

## 2015-05-24 ENCOUNTER — Other Ambulatory Visit: Payer: Self-pay | Admitting: Internal Medicine

## 2015-08-24 ENCOUNTER — Other Ambulatory Visit: Payer: Self-pay | Admitting: Internal Medicine

## 2015-08-28 ENCOUNTER — Other Ambulatory Visit: Payer: Self-pay | Admitting: Internal Medicine

## 2015-09-28 ENCOUNTER — Other Ambulatory Visit (HOSPITAL_COMMUNITY): Payer: Self-pay | Admitting: Nurse Practitioner

## 2015-09-29 ENCOUNTER — Other Ambulatory Visit (HOSPITAL_COMMUNITY): Payer: Self-pay | Admitting: Nurse Practitioner

## 2015-10-26 ENCOUNTER — Encounter (HOSPITAL_COMMUNITY): Payer: Self-pay | Admitting: Nurse Practitioner

## 2015-10-26 ENCOUNTER — Ambulatory Visit (HOSPITAL_COMMUNITY)
Admission: RE | Admit: 2015-10-26 | Discharge: 2015-10-26 | Disposition: A | Discharge status: Home / Self Care | Payer: PPO | Source: Ambulatory Visit | Attending: Nurse Practitioner | Admitting: Nurse Practitioner

## 2015-10-26 VITALS — BP 142/86 | Ht 69.0 in | Wt 217.0 lb

## 2015-10-26 DIAGNOSIS — Z8249 Family history of ischemic heart disease and other diseases of the circulatory system: Secondary | ICD-10-CM | POA: Diagnosis not present

## 2015-10-26 DIAGNOSIS — Z79899 Other long term (current) drug therapy: Secondary | ICD-10-CM | POA: Diagnosis not present

## 2015-10-26 DIAGNOSIS — F329 Major depressive disorder, single episode, unspecified: Secondary | ICD-10-CM | POA: Insufficient documentation

## 2015-10-26 DIAGNOSIS — I481 Persistent atrial fibrillation: Secondary | ICD-10-CM | POA: Diagnosis not present

## 2015-10-26 DIAGNOSIS — I10 Essential (primary) hypertension: Secondary | ICD-10-CM | POA: Diagnosis not present

## 2015-10-26 DIAGNOSIS — I4819 Other persistent atrial fibrillation: Secondary | ICD-10-CM

## 2015-10-26 DIAGNOSIS — Z7902 Long term (current) use of antithrombotics/antiplatelets: Secondary | ICD-10-CM | POA: Diagnosis not present

## 2015-10-26 DIAGNOSIS — Z888 Allergy status to other drugs, medicaments and biological substances status: Secondary | ICD-10-CM | POA: Diagnosis not present

## 2015-10-26 DIAGNOSIS — E785 Hyperlipidemia, unspecified: Secondary | ICD-10-CM | POA: Insufficient documentation

## 2015-10-26 LAB — BASIC METABOLIC PANEL
Anion gap: 4 — ABNORMAL LOW (ref 5–15)
BUN: 18 mg/dL (ref 6–20)
CHLORIDE: 106 mmol/L (ref 101–111)
CO2: 29 mmol/L (ref 22–32)
Calcium: 9.1 mg/dL (ref 8.9–10.3)
Creatinine, Ser: 1.14 mg/dL — ABNORMAL HIGH (ref 0.44–1.00)
GFR, EST AFRICAN AMERICAN: 56 mL/min — AB (ref >60)
GFR, EST NON AFRICAN AMERICAN: 48 mL/min — AB (ref >60)
Glucose, Bld: 101 mg/dL — ABNORMAL HIGH (ref 65–99)
POTASSIUM: 4.6 mmol/L (ref 3.5–5.1)
SODIUM: 139 mmol/L (ref 135–145)

## 2015-10-26 LAB — MAGNESIUM: MAGNESIUM: 2.1 mg/dL (ref 1.7–2.4)

## 2015-10-26 MED ORDER — DOFETILIDE 500 MCG PO CAPS
500.0000 ug | ORAL_CAPSULE | Freq: Two times a day (BID) | ORAL | 3 refills | Status: DC
Start: 2015-10-26 — End: 2016-03-01

## 2015-10-26 NOTE — Progress Notes (Signed)
Patient ID: Rose Ball, female   DOB: Oct 22, 1945, 70 y.o.   MRN: [id number]     Primary Care Physician: No PCP Per Patient Referring Physician: Dr. Nyoka Lint is a 70 y.o. female with a h/o persistent afib that was placed on tikosyn last fall and is here for f/u. She reports no episodes of afib. Is being compliant with tikosyn and pradaxa. No bleeding issues and no changes with her general health.  Today, she denies symptoms of palpitations, chest pain, shortness of breath, orthopnea, PND, lower extremity edema, dizziness, presyncope, syncope, or neurologic sequela. The patient is tolerating medications without difficulties and is otherwise without complaint today.   Past Medical History:  Diagnosis Date  . Attention deficit    on Vyvanse  . Chronic anticoagulation   . Depression   . Dyslipidemia   . Hypertension   . Hypokalemia   . Morbid obesity ([place])   . Palpitations   . Persistent atrial fibrillation ([place]) 05/27/14 Chadsvasc score of 4   s/p cardioversion Sept 2012; failed   Past Surgical History:  Procedure Laterality Date  . BREAST BIOPSY    . CARDIOVERSION  Sept 2012   failed  . CARDIOVERSION N/A 09/08/2012   Procedure: CARDIOVERSION;  Surgeon: Fay Records, MD;  Location: [facility] ENDOSCOPY;  Service: Cardiovascular;  Laterality: N/A;  . CARDIOVERSION N/A 02/15/2013   Procedure: CARDIOVERSION-BEDSIDE;  Surgeon: Darlin Coco, MD;  Location: [place];  Service: Cardiovascular;  Laterality: N/A;  . CARDIOVERSION N/A 02/05/2011   Procedure: CARDIOVERSION;  Surgeon: Evans Lance, MD;  Location: [facility] CATH LAB;  Service: Cardiovascular;  Laterality: N/A;  . Convergent procedure  11/13/11   afib ablation at [facility]  . EP IMPLANTABLE DEVICE N/A 02/09/2015   Procedure: Loop Recorder Removal;  Surgeon: [name], MD;  Location: [place] CV LAB;  Service: Cardiovascular;  Laterality: N/A;  . loop recorder insertion Left 09/2011  . Loop recorder removal  02/09/15     MDT Reveal XT loop recorder removed by Dr Rayann Heman  . TEE WITHOUT CARDIOVERSION N/A 09/08/2012   Procedure: TRANSESOPHAGEAL ECHOCARDIOGRAM (TEE);  Surgeon: Fay Records, MD;  Location: [facility] ENDOSCOPY;  Service: Cardiovascular;  Laterality: N/A;  . TONSILLECTOMY    . TONSILLECTOMY    . US ECHOCARDIOGRAPHY  Aug 0000000   Normal systolic function with EF 55% and slight LAE at 4.4cm    Current Outpatient Prescriptions  Medication Sig Dispense Refill  . amphetamine-dextroamphetamine (ADDERALL) 15 MG tablet Take 15 mg by mouth daily as needed (for additional focus).     Marland Kitchen atorvastatin (LIPITOR) 10 MG tablet TAKE 1 TABLET BY MOUTH EVERY DAY 90 tablet 1  . dabigatran (PRADAXA) 150 MG CAPS capsule Take 1 capsule (150 mg total) by mouth 2 (two) times daily. 60 capsule 6  . dofetilide (TIKOSYN) 500 MCG capsule Take 1 capsule (500 mcg total) by mouth 2 (two) times daily. 60 capsule 3  . escitalopram (LEXAPRO) 20 MG tablet Take 20 mg by mouth Daily. Lexapro    . KLOR-CON M10 10 MEQ tablet TAKE 1 TABLET BY MOUTH TWICE A DAY (Patient taking differently: TAKE 1 TABLET BY MOUTH TWICE A WEEK) 20 tablet 5  . metoprolol (LOPRESSOR) 50 MG tablet TAKE 1 TABLET (50 MG TOTAL) BY MOUTH 2 (TWO) TIMES DAILY. 120 tablet 3  . naproxen sodium (ANAPROX) 220 MG tablet Take 440 mg by mouth 2 (two) times daily as needed (for arthritis pain).    Marland Kitchen rOPINIRole (REQUIP) 1  MG tablet Take 1 mg by mouth at bedtime.      Marland Kitchen VYVANSE 70 MG capsule Take 70 mg by mouth Daily.      No current facility-administered medications for this encounter.     Allergies  Allergen Reactions  . Amiodarone Other (See Comments)    REACTION: dyspnea    Social History   Social History  . Marital status: Married    Spouse name: N/A  . Number of children: N/A  . Years of education: N/A   Occupational History  . Not on file.   Social History Main Topics  . Smoking status: Never Smoker  . Smokeless tobacco: Never Used  . Alcohol use No  . Drug  use: No  . Sexual activity: Not on file   Other Topics Concern  . Not on file   Social History Narrative   Pt lives in [place] alone.  Works at [employer]    Family History  Problem Relation Age of Onset  . Coronary artery disease Father     ROS- All systems are reviewed and negative except as per the HPI above  Physical Exam: Vitals:   10/26/15 1122  BP: (!) 142/86  Weight: 217 lb (98.4 kg)  Height: 5\' 9"  (1.753 m)    GEN- The patient is well appearing, alert and oriented x 3 today.   Head- normocephalic, atraumatic Eyes-  Sclera clear, conjunctiva pink Ears- hearing intact Oropharynx- clear Neck- supple, no JVP Lymph- no cervical lymphadenopathy Lungs- Clear to ausculation bilaterally, normal work of breathing Heart- Regular rate and rhythm, no murmurs, rubs or gallops, PMI not laterally displaced GI- soft, NT, ND, + BS Extremities- no clubbing, cyanosis, or edema MS- no significant deformity or atrophy Skin- no rash or lesion Psych- euthymic mood, full affect Neuro- strength and sensation are intact  EKG- NSR  at 64 bpm, pr int 210 ms, qrs int  88 ms, qtc 476 ms Epic records reviewed  Assessment and Plan: 1. Persistent  AFib Maintaining SR with tikosyn, continue 500 mg bid Continue pradaxa with a chadsvasc score of  at least 3 Bmet/mag today  2. HTN Stable    F/u in afib clinic in 3 months   Butch Penny C. Durene [place], [facility] [address] [place], [place] [address] [phone]

## 2015-11-01 ENCOUNTER — Other Ambulatory Visit: Payer: Self-pay | Admitting: Nurse Practitioner

## 2016-01-22 ENCOUNTER — Other Ambulatory Visit: Payer: Self-pay | Admitting: Internal Medicine

## 2016-01-26 ENCOUNTER — Inpatient Hospital Stay (HOSPITAL_COMMUNITY): Admission: RE | Admit: 2016-01-26 | Payer: PPO | Source: Ambulatory Visit | Admitting: Nurse Practitioner

## 2016-01-31 ENCOUNTER — Ambulatory Visit (HOSPITAL_COMMUNITY)
Admission: RE | Admit: 2016-01-31 | Discharge: 2016-01-31 | Disposition: A | Discharge status: Home / Self Care | Payer: PPO | Source: Ambulatory Visit | Attending: Nurse Practitioner | Admitting: Nurse Practitioner

## 2016-01-31 VITALS — BP 158/86 | HR 68 | Ht 69.0 in | Wt 226.0 lb

## 2016-01-31 DIAGNOSIS — E785 Hyperlipidemia, unspecified: Secondary | ICD-10-CM | POA: Insufficient documentation

## 2016-01-31 DIAGNOSIS — Z79899 Other long term (current) drug therapy: Secondary | ICD-10-CM | POA: Diagnosis not present

## 2016-01-31 DIAGNOSIS — Z8249 Family history of ischemic heart disease and other diseases of the circulatory system: Secondary | ICD-10-CM | POA: Diagnosis not present

## 2016-01-31 DIAGNOSIS — I10 Essential (primary) hypertension: Secondary | ICD-10-CM | POA: Insufficient documentation

## 2016-01-31 DIAGNOSIS — Z888 Allergy status to other drugs, medicaments and biological substances status: Secondary | ICD-10-CM | POA: Diagnosis not present

## 2016-01-31 DIAGNOSIS — I481 Persistent atrial fibrillation: Secondary | ICD-10-CM | POA: Insufficient documentation

## 2016-01-31 DIAGNOSIS — I4819 Other persistent atrial fibrillation: Secondary | ICD-10-CM

## 2016-01-31 DIAGNOSIS — Z7901 Long term (current) use of anticoagulants: Secondary | ICD-10-CM | POA: Diagnosis not present

## 2016-01-31 DIAGNOSIS — F329 Major depressive disorder, single episode, unspecified: Secondary | ICD-10-CM | POA: Insufficient documentation

## 2016-01-31 LAB — BASIC METABOLIC PANEL
ANION GAP: 6 (ref 5–15)
BUN: 19 mg/dL (ref 6–20)
CO2: 26 mmol/L (ref 22–32)
Calcium: 8.7 mg/dL — ABNORMAL LOW (ref 8.9–10.3)
Chloride: 109 mmol/L (ref 101–111)
Creatinine, Ser: 1.24 mg/dL — ABNORMAL HIGH (ref 0.44–1.00)
GFR calc non Af Amer: 43 mL/min — ABNORMAL LOW (ref >60)
GFR, EST AFRICAN AMERICAN: 50 mL/min — AB (ref >60)
Glucose, Bld: 86 mg/dL (ref 65–99)
POTASSIUM: 4.6 mmol/L (ref 3.5–5.1)
SODIUM: 141 mmol/L (ref 135–145)

## 2016-01-31 LAB — MAGNESIUM: MAGNESIUM: 1.8 mg/dL (ref 1.7–2.4)

## 2016-01-31 NOTE — Progress Notes (Signed)
Patient ID: Rose Ball, female   DOB: [birth year], 70 y.o.   MRN: [id number]     Primary Care Physician: No PCP Per Patient Referring Physician: Dr. Nyoka Lint is a 70 y.o. female with a h/o persistent afib that was placed on tikosyn 7/16 and is here for f/u. She reports no episodes of afib. Is being compliant with tikosyn and pradaxa. No bleeding issues and no changes with her general health.  F/u 10/31 in afib clinic. She is in SR and is being consistent with taking tikosyn. No afib that she can appreciate. No complaints. No issues with Pradaxa and bleeding.  Today, she denies symptoms of palpitations, chest pain, shortness of breath, orthopnea, PND, lower extremity edema, dizziness, presyncope, syncope, or neurologic sequela. The patient is tolerating medications without difficulties and is otherwise without complaint today.   Past Medical History:  Diagnosis Date  . Attention deficit    on Vyvanse  . Chronic anticoagulation   . Depression   . Dyslipidemia   . Hypertension   . Hypokalemia   . Morbid obesity ([place])   . Palpitations   . Persistent atrial fibrillation ([place]) 05/27/14 Chadsvasc score of 4   s/p cardioversion Sept 2012; failed   Past Surgical History:  Procedure Laterality Date  . BREAST BIOPSY    . CARDIOVERSION  Sept 2012   failed  . CARDIOVERSION N/A 09/08/2012   Procedure: CARDIOVERSION;  Surgeon: Fay Records, MD;  Location: [facility] ENDOSCOPY;  Service: Cardiovascular;  Laterality: N/A;  . CARDIOVERSION N/A 02/15/2013   Procedure: CARDIOVERSION-BEDSIDE;  Surgeon: Darlin Coco, MD;  Location: [place];  Service: Cardiovascular;  Laterality: N/A;  . CARDIOVERSION N/A 02/05/2011   Procedure: CARDIOVERSION;  Surgeon: Evans Lance, MD;  Location: [facility] CATH LAB;  Service: Cardiovascular;  Laterality: N/A;  . Convergent procedure  11/13/11   afib ablation at [facility]  . EP IMPLANTABLE DEVICE N/A 02/09/2015   Procedure: Loop Recorder Removal;  Surgeon:  [name], MD;  Location: [place] CV LAB;  Service: Cardiovascular;  Laterality: N/A;  . loop recorder insertion Left 09/2011  . Loop recorder removal  02/09/15   MDT Reveal XT loop recorder removed by Dr Rayann Heman  . TEE WITHOUT CARDIOVERSION N/A 09/08/2012   Procedure: TRANSESOPHAGEAL ECHOCARDIOGRAM (TEE);  Surgeon: Fay Records, MD;  Location: [facility] ENDOSCOPY;  Service: Cardiovascular;  Laterality: N/A;  . TONSILLECTOMY    . TONSILLECTOMY    . US ECHOCARDIOGRAPHY  Aug 0000000   Normal systolic function with EF 55% and slight LAE at 4.4cm    Current Outpatient Prescriptions  Medication Sig Dispense Refill  . amphetamine-dextroamphetamine (ADDERALL) 15 MG tablet Take 15 mg by mouth daily as needed (for additional focus).     Marland Kitchen atorvastatin (LIPITOR) 10 MG tablet TAKE 1 TABLET BY MOUTH EVERY DAY 90 tablet 1  . dabigatran (PRADAXA) 150 MG CAPS capsule Take 1 capsule (150 mg total) by mouth 2 (two) times daily. 60 capsule 6  . dofetilide (TIKOSYN) 500 MCG capsule Take 1 capsule (500 mcg total) by mouth 2 (two) times daily. 60 capsule 3  . escitalopram (LEXAPRO) 20 MG tablet Take 20 mg by mouth Daily. Lexapro    . metoprolol (LOPRESSOR) 50 MG tablet TAKE 1 TABLET BY MOUTH TWICE A DAY 120 tablet 3  . naproxen sodium (ANAPROX) 220 MG tablet Take 440 mg by mouth 2 (two) times daily as needed (for arthritis pain).    . potassium chloride (KLOR-CON M10) 10 MEQ tablet  Take 1 tablet (10 mEq total) by mouth 2 (two) times daily. (Patient taking differently: Take 10 mEq by mouth daily. ) 60 tablet 11  . rOPINIRole (REQUIP) 1 MG tablet Take 1 mg by mouth at bedtime.      Marland Kitchen VYVANSE 70 MG capsule Take 70 mg by mouth Daily.      No current facility-administered medications for this encounter.     Allergies  Allergen Reactions  . Amiodarone Other (See Comments)    REACTION: dyspnea    Social History   Social History  . Marital status: Married    Spouse name: N/A  . Number of children: N/A  . Years of  education: N/A   Occupational History  . Not on file.   Social History Main Topics  . Smoking status: Never Smoker  . Smokeless tobacco: Never Used  . Alcohol use No  . Drug use: No  . Sexual activity: Not on file   Other Topics Concern  . Not on file   Social History Narrative   Pt lives in [place] alone.  Works at [employer]    Family History  Problem Relation Age of Onset  . Coronary artery disease Father     ROS- All systems are reviewed and negative except as per the HPI above  Physical Exam: Vitals:   01/31/16 1516  BP: (!) 158/86  Pulse: 68  Weight: 226 lb (102.5 kg)  Height: 5\' 9"  (1.753 m)    GEN- The patient is well appearing, alert and oriented x 3 today.   Head- normocephalic, atraumatic Eyes-  Sclera clear, conjunctiva pink Ears- hearing intact Oropharynx- clear Neck- supple, no JVP Lymph- no cervical lymphadenopathy Lungs- Clear to ausculation bilaterally, normal work of breathing Heart- Regular rate and rhythm, no murmurs, rubs or gallops, PMI not laterally displaced GI- soft, NT, ND, + BS Extremities- no clubbing, cyanosis, or edema MS- no significant deformity or atrophy Skin- no rash or lesion Psych- euthymic mood, full affect Neuro- strength and sensation are intact  EKG- NSR  at 68 bpm, pr int 200 ms, qrs int  80 ms, qtc 491 ms Epic records reviewed  Assessment and Plan: 1. Persistent  AFib Maintaining SR with tikosyn, continue 500 mg bid Continue pradaxa with a chadsvasc score of  at least 3 Bmet/mag today  2. HTN Stable Rechecked at 130/78    F/u in afib clinic in 6 months   [place]. Lorriane Dehart, [facility] [address] [place], [place] [address] [phone]

## 2016-02-02 ENCOUNTER — Other Ambulatory Visit (HOSPITAL_COMMUNITY): Payer: Self-pay | Admitting: *Deleted

## 2016-02-03 ENCOUNTER — Other Ambulatory Visit: Payer: Self-pay | Admitting: Internal Medicine

## 2016-03-01 ENCOUNTER — Other Ambulatory Visit: Payer: Self-pay | Admitting: Internal Medicine

## 2016-03-01 ENCOUNTER — Other Ambulatory Visit (HOSPITAL_COMMUNITY): Payer: Self-pay | Admitting: Nurse Practitioner

## 2016-03-02 ENCOUNTER — Other Ambulatory Visit: Payer: Self-pay | Admitting: Internal Medicine

## 2016-03-02 ENCOUNTER — Other Ambulatory Visit (HOSPITAL_COMMUNITY): Payer: Self-pay | Admitting: Nurse Practitioner

## 2016-03-03 ENCOUNTER — Other Ambulatory Visit (HOSPITAL_COMMUNITY): Payer: Self-pay | Admitting: Nurse Practitioner

## 2016-03-31 ENCOUNTER — Other Ambulatory Visit: Payer: Self-pay | Admitting: Internal Medicine

## 2016-04-03 ENCOUNTER — Other Ambulatory Visit: Payer: Self-pay | Admitting: Internal Medicine

## 2016-08-28 ENCOUNTER — Inpatient Hospital Stay (HOSPITAL_COMMUNITY): Admission: RE | Admit: 2016-08-28 | Payer: PPO | Source: Ambulatory Visit | Admitting: Nurse Practitioner

## 2016-09-04 ENCOUNTER — Other Ambulatory Visit: Payer: Self-pay | Admitting: Nurse Practitioner

## 2016-09-04 ENCOUNTER — Other Ambulatory Visit: Payer: Self-pay | Admitting: Internal Medicine

## 2016-10-02 ENCOUNTER — Other Ambulatory Visit: Payer: Self-pay | Admitting: Nurse Practitioner

## 2016-10-04 ENCOUNTER — Other Ambulatory Visit (HOSPITAL_COMMUNITY): Payer: Self-pay | Admitting: *Deleted

## 2016-10-04 ENCOUNTER — Telehealth (HOSPITAL_COMMUNITY): Payer: Self-pay | Admitting: *Deleted

## 2016-10-04 MED ORDER — ATORVASTATIN CALCIUM 10 MG PO TABS: 10.0000 mg | ORAL_TABLET | Freq: Every day | ORAL | 0 refills | Status: DC

## 2016-10-04 NOTE — Telephone Encounter (Signed)
Left vcml for pt to [facility]bk to sched f/u appt. She is past due for follow up and on tikosyn. Pts Rxs for atorvastatin and metoprolol also due for renewal.

## 2016-10-10 ENCOUNTER — Encounter (HOSPITAL_COMMUNITY): Payer: Self-pay | Admitting: Nurse Practitioner

## 2016-10-10 ENCOUNTER — Ambulatory Visit (HOSPITAL_COMMUNITY)
Admission: RE | Admit: 2016-10-10 | Discharge: 2016-10-10 | Disposition: A | Discharge status: Home / Self Care | Payer: PPO | Source: Ambulatory Visit | Attending: Nurse Practitioner | Admitting: Nurse Practitioner

## 2016-10-10 VITALS — BP 140/72 | HR 62 | Ht 69.0 in | Wt 239.0 lb

## 2016-10-10 DIAGNOSIS — F329 Major depressive disorder, single episode, unspecified: Secondary | ICD-10-CM | POA: Diagnosis not present

## 2016-10-10 DIAGNOSIS — I10 Essential (primary) hypertension: Secondary | ICD-10-CM | POA: Insufficient documentation

## 2016-10-10 DIAGNOSIS — E876 Hypokalemia: Secondary | ICD-10-CM | POA: Insufficient documentation

## 2016-10-10 DIAGNOSIS — I481 Persistent atrial fibrillation: Secondary | ICD-10-CM | POA: Insufficient documentation

## 2016-10-10 DIAGNOSIS — Z7901 Long term (current) use of anticoagulants: Secondary | ICD-10-CM | POA: Diagnosis not present

## 2016-10-10 DIAGNOSIS — E785 Hyperlipidemia, unspecified: Secondary | ICD-10-CM | POA: Insufficient documentation

## 2016-10-10 DIAGNOSIS — Z7902 Long term (current) use of antithrombotics/antiplatelets: Secondary | ICD-10-CM | POA: Insufficient documentation

## 2016-10-10 DIAGNOSIS — I4819 Other persistent atrial fibrillation: Secondary | ICD-10-CM

## 2016-10-10 LAB — BASIC METABOLIC PANEL
Anion gap: 5 (ref 5–15)
BUN: 24 mg/dL — AB (ref 6–20)
CALCIUM: 9 mg/dL (ref 8.9–10.3)
CO2: 27 mmol/L (ref 22–32)
Chloride: 108 mmol/L (ref 101–111)
Creatinine, Ser: 1.54 mg/dL — ABNORMAL HIGH (ref 0.44–1.00)
GFR calc Af Amer: 39 mL/min — ABNORMAL LOW (ref >60)
GFR, EST NON AFRICAN AMERICAN: 33 mL/min — AB (ref >60)
GLUCOSE: 95 mg/dL (ref 65–99)
Potassium: 4.8 mmol/L (ref 3.5–5.1)
SODIUM: 140 mmol/L (ref 135–145)

## 2016-10-10 LAB — MAGNESIUM: Magnesium: 2.1 mg/dL (ref 1.7–2.4)

## 2016-10-10 NOTE — Progress Notes (Signed)
Patient ID: Rose Ball, female   DOB: 07/11/1945, 71 y.o.   MRN: [id number]     Primary Care Physician: Patient, No Pcp Per Referring Physician: Dr. Nyoka Lint is a 71 y.o. female with a h/o persistent afib that was placed on tikosyn 10/2014 and is here for f/u after many cancelled appointments. Last time seen was one year ago. She reports no episodes of afib. Is being compliant with tikosyn and pradaxa. No bleeding issues and no changes with her general health.   Today, she denies symptoms of palpitations, chest pain, shortness of breath, orthopnea, PND, lower extremity edema, dizziness, presyncope, syncope, or neurologic sequela. The patient is tolerating medications without difficulties and is otherwise without complaint today.   Past Medical History:  Diagnosis Date  . Attention deficit    on Vyvanse  . Chronic anticoagulation   . Depression   . Dyslipidemia   . Hypertension   . Hypokalemia   . Morbid obesity ([place])   . Palpitations   . Persistent atrial fibrillation ([place]) 05/27/14 Chadsvasc score of 4   s/p cardioversion Sept 2012; failed   Past Surgical History:  Procedure Laterality Date  . BREAST BIOPSY    . CARDIOVERSION  Sept 2012   failed  . CARDIOVERSION N/A 09/08/2012   Procedure: CARDIOVERSION;  Surgeon: Fay Records, MD;  Location: [facility] ENDOSCOPY;  Service: Cardiovascular;  Laterality: N/A;  . CARDIOVERSION N/A 02/15/2013   Procedure: CARDIOVERSION-BEDSIDE;  Surgeon: Darlin Coco, MD;  Location: [place];  Service: Cardiovascular;  Laterality: N/A;  . CARDIOVERSION N/A 02/05/2011   Procedure: CARDIOVERSION;  Surgeon: Evans Lance, MD;  Location: [facility] CATH LAB;  Service: Cardiovascular;  Laterality: N/A;  . Convergent procedure  11/13/11   afib ablation at [facility]  . EP IMPLANTABLE DEVICE N/A 02/09/2015   Procedure: Loop Recorder Removal;  Surgeon: [name], MD;  Location: [place] CV LAB;  Service: Cardiovascular;  Laterality: N/A;  . loop  recorder insertion Left 09/2011  . Loop recorder removal  02/09/15   MDT Reveal XT loop recorder removed by Dr Rayann Heman  . TEE WITHOUT CARDIOVERSION N/A 09/08/2012   Procedure: TRANSESOPHAGEAL ECHOCARDIOGRAM (TEE);  Surgeon: Fay Records, MD;  Location: [facility] ENDOSCOPY;  Service: Cardiovascular;  Laterality: N/A;  . TONSILLECTOMY    . TONSILLECTOMY    . US ECHOCARDIOGRAPHY  Aug 6314   Normal systolic function with EF 55% and slight LAE at 4.4cm    Current Outpatient Prescriptions  Medication Sig Dispense Refill  . amphetamine-dextroamphetamine (ADDERALL) 15 MG tablet Take 15 mg by mouth daily as needed (for additional focus).     Marland Kitchen atorvastatin (LIPITOR) 10 MG tablet Take 1 tablet (10 mg total) by mouth daily. 30 tablet 0  . dofetilide (TIKOSYN) 500 MCG capsule TAKE 1 CAPSULE (500 MCG TOTAL) BY MOUTH 2 (TWO) TIMES DAILY. 60 capsule 6  . escitalopram (LEXAPRO) 20 MG tablet Take 20 mg by mouth Daily. Lexapro    . metoprolol tartrate (LOPRESSOR) 50 MG tablet TAKE 1 TABLET BY MOUTH TWICE A DAY 60 tablet 0  . naproxen sodium (ANAPROX) 220 MG tablet Take 440 mg by mouth 2 (two) times daily as needed (for arthritis pain).    Marland Kitchen OVER THE COUNTER MEDICATION Take 400 mg by mouth 2 (two) times daily. Magnical/D Take 1 tablet by mouth twice a day    . potassium chloride (KLOR-CON M10) 10 MEQ tablet Take 1 tablet (10 mEq total) by mouth 2 (two) times daily. (Patient  taking differently: Take 10 mEq by mouth daily. ) 60 tablet 11  . PRADAXA 150 MG CAPS capsule TAKE ONE CAPSULE BY MOUTH TWICE A DAY 60 capsule 8  . rOPINIRole (REQUIP) 1 MG tablet Take 1 mg by mouth at bedtime.      Marland Kitchen VYVANSE 70 MG capsule Take 70 mg by mouth Daily.      No current facility-administered medications for this encounter.     Allergies  Allergen Reactions  . Amiodarone Other (See Comments)    REACTION: dyspnea    Social History   Social History  . Marital status: Married    Spouse name: N/A  . Number of children: N/A  .  Years of education: N/A   Occupational History  . Not on file.   Social History Main Topics  . Smoking status: Never Smoker  . Smokeless tobacco: Never Used  . Alcohol use No  . Drug use: No  . Sexual activity: Not on file   Other Topics Concern  . Not on file   Social History Narrative   Pt lives in [place] alone.  Works at [employer]    Family History  Problem Relation Age of Onset  . Coronary artery disease Father     ROS- All systems are reviewed and negative except as per the HPI above  Physical Exam: Vitals:   10/10/16 1512  BP: 140/72  Pulse: 62  Weight: 239 lb (108.4 kg)  Height: 5\' 9"  (1.753 m)    GEN- The patient is well appearing, alert and oriented x 3 today.   Head- normocephalic, atraumatic Eyes-  Sclera clear, conjunctiva pink Ears- hearing intact Oropharynx- clear Neck- supple, no JVP Lymph- no cervical lymphadenopathy Lungs- Clear to ausculation bilaterally, normal work of breathing Heart- Regular rate and rhythm, no murmurs, rubs or gallops, PMI not laterally displaced GI- soft, NT, ND, + BS Extremities- no clubbing, cyanosis, or edema MS- no significant deformity or atrophy Skin- no rash or lesion Psych- euthymic mood, full affect Neuro- strength and sensation are intact  EKG- NSR  at 62 bpm, with first degree block, pr int 214 ms, qrs int  84 ms, qtc 472 ms Epic records reviewed  Assessment and Plan: 1. Persistent  AFib Maintaining SR with tikosyn, continue 500 mg bid Continue pradaxa with a chadsvasc score of  at least 3 Bmet/mag today Reminded pt being on tikosyn, she really should be followed more than once a year  2. HTN Stable  F/u in afib clinic in 5-6 months    Butch Penny C. [place], [facility] [address] [place], [place] [address] [phone]

## 2016-10-24 ENCOUNTER — Ambulatory Visit (HOSPITAL_COMMUNITY)
Admission: RE | Admit: 2016-10-24 | Discharge: 2016-10-24 | Disposition: A | Discharge status: Home / Self Care | Payer: PPO | Source: Ambulatory Visit | Attending: Nurse Practitioner | Admitting: Nurse Practitioner

## 2016-10-24 DIAGNOSIS — I481 Persistent atrial fibrillation: Secondary | ICD-10-CM | POA: Diagnosis not present

## 2016-10-24 LAB — BASIC METABOLIC PANEL
ANION GAP: 6 (ref 5–15)
BUN: 20 mg/dL (ref 6–20)
CALCIUM: 9 mg/dL (ref 8.9–10.3)
CO2: 28 mmol/L (ref 22–32)
Chloride: 104 mmol/L (ref 101–111)
Creatinine, Ser: 1.43 mg/dL — ABNORMAL HIGH (ref 0.44–1.00)
GFR calc Af Amer: 42 mL/min — ABNORMAL LOW (ref >60)
GFR calc non Af Amer: 36 mL/min — ABNORMAL LOW (ref >60)
GLUCOSE: 95 mg/dL (ref 65–99)
Potassium: 4.6 mmol/L (ref 3.5–5.1)
Sodium: 138 mmol/L (ref 135–145)

## 2016-11-04 ENCOUNTER — Other Ambulatory Visit (HOSPITAL_COMMUNITY): Payer: Self-pay | Admitting: Nurse Practitioner

## 2016-11-12 ENCOUNTER — Other Ambulatory Visit: Payer: Self-pay | Admitting: Nurse Practitioner

## 2016-11-14 ENCOUNTER — Other Ambulatory Visit: Payer: Self-pay | Admitting: Nurse Practitioner

## 2017-01-08 ENCOUNTER — Other Ambulatory Visit: Payer: Self-pay | Admitting: Nurse Practitioner

## 2017-01-09 ENCOUNTER — Other Ambulatory Visit: Payer: Self-pay | Admitting: Nurse Practitioner

## 2017-02-07 ENCOUNTER — Other Ambulatory Visit (HOSPITAL_COMMUNITY): Payer: Self-pay | Admitting: Nurse Practitioner

## 2017-02-07 ENCOUNTER — Other Ambulatory Visit: Payer: Self-pay | Admitting: Internal Medicine

## 2017-02-07 NOTE — Telephone Encounter (Signed)
This is a A-fib pt. °

## 2017-03-14 ENCOUNTER — Encounter (HOSPITAL_COMMUNITY): Payer: Self-pay | Admitting: Nurse Practitioner

## 2017-03-14 ENCOUNTER — Ambulatory Visit (HOSPITAL_COMMUNITY)
Admission: RE | Admit: 2017-03-14 | Discharge: 2017-03-14 | Disposition: A | Discharge status: Home / Self Care | Payer: PPO | Source: Ambulatory Visit | Attending: Nurse Practitioner | Admitting: Nurse Practitioner

## 2017-03-14 VITALS — BP 136/82 | HR 62 | Ht 69.0 in | Wt 242.0 lb

## 2017-03-14 DIAGNOSIS — Z7901 Long term (current) use of anticoagulants: Secondary | ICD-10-CM | POA: Insufficient documentation

## 2017-03-14 DIAGNOSIS — I10 Essential (primary) hypertension: Secondary | ICD-10-CM | POA: Insufficient documentation

## 2017-03-14 DIAGNOSIS — I481 Persistent atrial fibrillation: Secondary | ICD-10-CM | POA: Insufficient documentation

## 2017-03-14 DIAGNOSIS — E876 Hypokalemia: Secondary | ICD-10-CM | POA: Insufficient documentation

## 2017-03-14 DIAGNOSIS — I4891 Unspecified atrial fibrillation: Secondary | ICD-10-CM | POA: Diagnosis present

## 2017-03-14 DIAGNOSIS — F329 Major depressive disorder, single episode, unspecified: Secondary | ICD-10-CM | POA: Insufficient documentation

## 2017-03-14 DIAGNOSIS — I480 Paroxysmal atrial fibrillation: Secondary | ICD-10-CM

## 2017-03-14 DIAGNOSIS — Z79899 Other long term (current) drug therapy: Secondary | ICD-10-CM | POA: Diagnosis not present

## 2017-03-14 DIAGNOSIS — E785 Hyperlipidemia, unspecified: Secondary | ICD-10-CM | POA: Diagnosis not present

## 2017-03-14 LAB — BASIC METABOLIC PANEL
Anion gap: 6 (ref 5–15)
BUN: 21 mg/dL — AB (ref 6–20)
CHLORIDE: 107 mmol/L (ref 101–111)
CO2: 26 mmol/L (ref 22–32)
CREATININE: 1.39 mg/dL — AB (ref 0.44–1.00)
Calcium: 8.8 mg/dL — ABNORMAL LOW (ref 8.9–10.3)
GFR calc Af Amer: 43 mL/min — ABNORMAL LOW (ref >60)
GFR calc non Af Amer: 37 mL/min — ABNORMAL LOW (ref >60)
GLUCOSE: 95 mg/dL (ref 65–99)
POTASSIUM: 4.5 mmol/L (ref 3.5–5.1)
SODIUM: 139 mmol/L (ref 135–145)

## 2017-03-14 LAB — MAGNESIUM: Magnesium: 1.9 mg/dL (ref 1.7–2.4)

## 2017-03-14 NOTE — Progress Notes (Signed)
Patient ID: Rose Ball, female   DOB: 01-Aug-1945, 71 y.o.   MRN: [id number]     Primary Care Physician: Patient, No Pcp Per Referring Physician: Dr. Nyoka Lint is a 71 y.o. female with a h/o afib on tikosyn. She is in the clinic for f/u. She reports that she is staying in [place]. Being compliant with dofetilide and pradaxa. No bleeding issues.  Today, she denies symptoms of palpitations, chest pain, shortness of breath, orthopnea, PND, lower extremity edema, dizziness, presyncope, syncope, or neurologic sequela. The patient is tolerating medications without difficulties and is otherwise without complaint today.   Past Medical History:  Diagnosis Date  . Attention deficit    on Vyvanse  . Chronic anticoagulation   . Depression   . Dyslipidemia   . Hypertension   . Hypokalemia   . Morbid obesity ([place])   . Palpitations   . Persistent atrial fibrillation ([place]) 05/27/14 Chadsvasc score of 4   s/p cardioversion Sept 2012; failed   Past Surgical History:  Procedure Laterality Date  . BREAST BIOPSY    . CARDIOVERSION  Sept 2012   failed  . CARDIOVERSION N/A 09/08/2012   Procedure: CARDIOVERSION;  Surgeon: Fay Records, MD;  Location: [facility] ENDOSCOPY;  Service: Cardiovascular;  Laterality: N/A;  . CARDIOVERSION N/A 02/15/2013   Procedure: CARDIOVERSION-BEDSIDE;  Surgeon: Darlin Coco, MD;  Location: [place];  Service: Cardiovascular;  Laterality: N/A;  . CARDIOVERSION N/A 02/05/2011   Procedure: CARDIOVERSION;  Surgeon: Evans Lance, MD;  Location: [facility] CATH LAB;  Service: Cardiovascular;  Laterality: N/A;  . Convergent procedure  11/13/11   afib ablation at [facility]  . EP IMPLANTABLE DEVICE N/A 02/09/2015   Procedure: Loop Recorder Removal;  Surgeon: [name], MD;  Location: [place] CV LAB;  Service: Cardiovascular;  Laterality: N/A;  . loop recorder insertion Left 09/2011  . Loop recorder removal  02/09/15   MDT Reveal XT loop recorder removed by Dr Rayann Heman  . TEE  WITHOUT CARDIOVERSION N/A 09/08/2012   Procedure: TRANSESOPHAGEAL ECHOCARDIOGRAM (TEE);  Surgeon: Fay Records, MD;  Location: [facility] ENDOSCOPY;  Service: Cardiovascular;  Laterality: N/A;  . TONSILLECTOMY    . TONSILLECTOMY    . US ECHOCARDIOGRAPHY  Aug 3710   Normal systolic function with EF 55% and slight LAE at 4.4cm    Current Outpatient Medications  Medication Sig Dispense Refill  . amphetamine-dextroamphetamine (ADDERALL) 15 MG tablet Take 15 mg by mouth daily as needed (for additional focus).     Marland Kitchen atorvastatin (LIPITOR) 10 MG tablet TAKE 1 TABLET BY MOUTH EVERY DAY 30 tablet 6  . dofetilide (TIKOSYN) 500 MCG capsule TAKE 1 CAPSULE (500 MCG TOTAL) BY MOUTH 2 (TWO) TIMES DAILY. 60 capsule 6  . escitalopram (LEXAPRO) 20 MG tablet Take 20 mg by mouth Daily. Lexapro    . metoprolol tartrate (LOPRESSOR) 50 MG tablet TAKE 1 TABLET BY MOUTH TWICE A DAY 60 tablet 6  . naproxen sodium (ANAPROX) 220 MG tablet Take 440 mg by mouth 2 (two) times daily as needed (for arthritis pain).    . potassium chloride (KLOR-CON M10) 10 MEQ tablet Take 1 tablet (10 mEq total) daily by mouth. 30 tablet 6  . PRADAXA 150 MG CAPS capsule TAKE ONE CAPSULE BY MOUTH TWICE A DAY 60 capsule 8  . rOPINIRole (REQUIP) 1 MG tablet Take 1 mg by mouth at bedtime.      Marland Kitchen VYVANSE 70 MG capsule Take 70 mg by mouth Daily.     Marland Kitchen  OVER THE COUNTER MEDICATION Take 400 mg by mouth 2 (two) times daily. Magnical/D Take 1 tablet by mouth twice a day     No current facility-administered medications for this encounter.     Allergies  Allergen Reactions  . Amiodarone Other (See Comments)    REACTION: dyspnea    Social History   Socioeconomic History  . Marital status: Married    Spouse name: Not on file  . Number of children: Not on file  . Years of education: Not on file  . Highest education level: Not on file  Social Needs  . Financial resource strain: Not on file  . Food insecurity - worry: Not on file  . Food insecurity -  inability: Not on file  . Transportation needs - medical: Not on file  . Transportation needs - non-medical: Not on file  Occupational History  . Not on file  Tobacco Use  . Smoking status: Never Smoker  . Smokeless tobacco: Never Used  Substance and Sexual Activity  . Alcohol use: No  . Drug use: No  . Sexual activity: Not on file  Other Topics Concern  . Not on file  Social History Narrative   Pt lives in [place] alone.  Works at [employer]    Family History  Problem Relation Age of Onset  . Coronary artery disease Father     ROS- All systems are reviewed and negative except as per the HPI above  Physical Exam: Vitals:   03/14/17 1540  BP: 136/82  Pulse: 62  Weight: 242 lb (109.8 kg)  Height: 5\' 9"  (1.753 m)    GEN- The patient is well appearing, alert and oriented x 3 today.   Head- normocephalic, atraumatic Eyes-  Sclera clear, conjunctiva pink Ears- hearing intact Oropharynx- clear Neck- supple, no JVP Lymph- no cervical lymphadenopathy Lungs- Clear to ausculation bilaterally, normal work of breathing Heart- Regular rate and rhythm, no murmurs, rubs or gallops, PMI not laterally displaced GI- soft, NT, ND, + BS Extremities- no clubbing, cyanosis, or edema MS- no significant deformity or atrophy Skin- no rash or lesion Psych- euthymic mood, full affect Neuro- strength and sensation are intact  EKG- NSR  at 68 bpm, pr int 200 ms, qrs int  80 ms, qtc 491 ms Epic records reviewed  Assessment and Plan: 1. Persistent  AFib Maintaining SR with dofetilide , continue 500 mg bid Continue pradaxa with a chadsvasc score of  at least 3 Bmet/mag today  2. HTN Stable   She  still does not have a PCP and a few names of MD offices were given to pt today and encouraged to get established    F/u in afib clinic in 6 months   Butch Penny C. Reality Dej[place], [facility] [address] [place], [place]  [address] [phone]

## 2017-06-03 ENCOUNTER — Other Ambulatory Visit: Payer: Self-pay | Admitting: Nurse Practitioner

## 2017-06-05 ENCOUNTER — Other Ambulatory Visit (HOSPITAL_COMMUNITY): Payer: Self-pay | Admitting: Nurse Practitioner

## 2017-07-17 ENCOUNTER — Ambulatory Visit: Payer: PPO

## 2017-07-17 NOTE — Progress Notes (Deleted)
Subjective:   Rose Ball is a 72 y.o. female who presents for Medicare Annual (Subsequent) preventive examination.  Review of Systems:  No ROS.  Medicare Wellness Visit. Additional risk factors are reflected in the social history.    Sleep patterns: {SX; SLEEP PATTERNS:18802::"feels rested on waking","does not get up to void","gets up *** times nightly to void","sleeps *** hours nightly"}.    Home Safety/Smoke Alarms: Feels safe in home. Smoke alarms in place.  Living environment; residence and Firearm Safety: {Rehab home environment / accessibility:30080::"no firearms","firearms stored safely"}. Seat Belt Safety/Bike Helmet: Wears seat belt.     Objective:     Vitals: There were no vitals taken for this visit.  There is no height or weight on file to calculate BMI.  Advanced Directives 02/09/2015 02/13/2013 09/08/2012  Does Patient Have a Medical Advance Directive? No Patient does not have advance directive Patient does not have advance directive  Would patient like information on creating a medical advance directive? Yes - Scientist, clinical (histocompatibility and immunogenetics) given - -    Tobacco Social History   Tobacco Use  Smoking Status Never Smoker  Smokeless Tobacco Never Used     Counseling given: Not Answered  Past Medical History:  Diagnosis Date  . Attention deficit    on Vyvanse  . Chronic anticoagulation   . Depression   . Dyslipidemia   . Hypertension   . Hypokalemia   . Morbid obesity ([place])   . Palpitations   . Persistent atrial fibrillation ([place]) 05/27/14 Chadsvasc score of 4   s/p cardioversion Sept 2012; failed   Past Surgical History:  Procedure Laterality Date  . BREAST BIOPSY    . CARDIOVERSION  Sept 2012   failed  . CARDIOVERSION N/A 09/08/2012   Procedure: CARDIOVERSION;  Surgeon: Fay Records, MD;  Location: [facility] ENDOSCOPY;  Service: Cardiovascular;  Laterality: N/A;  . CARDIOVERSION N/A 02/15/2013   Procedure: CARDIOVERSION-BEDSIDE;  Surgeon: Darlin Coco,  MD;  Location: [place];  Service: Cardiovascular;  Laterality: N/A;  . CARDIOVERSION N/A 02/05/2011   Procedure: CARDIOVERSION;  Surgeon: Evans Lance, MD;  Location: [facility] CATH LAB;  Service: Cardiovascular;  Laterality: N/A;  . Convergent procedure  11/13/11   afib ablation at [facility]  . EP IMPLANTABLE DEVICE N/A 02/09/2015   Procedure: Loop Recorder Removal;  Surgeon: [name], MD;  Location: [place] CV LAB;  Service: Cardiovascular;  Laterality: N/A;  . loop recorder insertion Left 09/2011  . Loop recorder removal  02/09/15   MDT Reveal XT loop recorder removed by Dr Rayann Heman  . TEE WITHOUT CARDIOVERSION N/A 09/08/2012   Procedure: TRANSESOPHAGEAL ECHOCARDIOGRAM (TEE);  Surgeon: Fay Records, MD;  Location: [facility] ENDOSCOPY;  Service: Cardiovascular;  Laterality: N/A;  . TONSILLECTOMY    . TONSILLECTOMY    . US ECHOCARDIOGRAPHY  Aug 5188   Normal systolic function with EF 55% and slight LAE at 4.4cm   Family History  Problem Relation Age of Onset  . Coronary artery disease Father    Social History   Socioeconomic History  . Marital status: Married    Spouse name: Not on file  . Number of children: Not on file  . Years of education: Not on file  . Highest education level: Not on file  Occupational History  . Not on file  Social Needs  . Financial resource strain: Not on file  . Food insecurity:    Worry: Not on file    Inability: Not on file  . Transportation needs:  Medical: Not on file    Non-medical: Not on file  Tobacco Use  . Smoking status: Never Smoker  . Smokeless tobacco: Never Used  Substance and Sexual Activity  . Alcohol use: No  . Drug use: No  . Sexual activity: Not on file  Lifestyle  . Physical activity:    Days per week: Not on file    Minutes per session: Not on file  . Stress: Not on file  Relationships  . Social connections:    Talks on phone: Not on file    Gets together: Not on file    Attends religious service: Not on file    Active member of  club or organization: Not on file    Attends meetings of clubs or organizations: Not on file    Relationship status: Not on file  Other Topics Concern  . Not on file  Social History Narrative   Pt lives in [place] alone.  Works at [employer] Encounter Medications as of 07/17/2017  Medication Sig  . amphetamine-dextroamphetamine (ADDERALL) 15 MG tablet Take 15 mg by mouth daily as needed (for additional focus).   Marland Kitchen atorvastatin (LIPITOR) 10 MG tablet TAKE 1 TABLET BY MOUTH EVERY DAY  . dofetilide (TIKOSYN) 500 MCG capsule TAKE 1 CAPSULE (500 MCG TOTAL) BY MOUTH 2 (TWO) TIMES DAILY.  Marland Kitchen dofetilide (TIKOSYN) 500 MCG capsule TAKE 1 CAPSULE (500 MCG TOTAL) BY MOUTH 2 (TWO) TIMES DAILY.  Marland Kitchen escitalopram (LEXAPRO) 20 MG tablet Take 20 mg by mouth Daily. Lexapro  . metoprolol tartrate (LOPRESSOR) 50 MG tablet TAKE 1 TABLET BY MOUTH TWICE A DAY  . naproxen sodium (ANAPROX) 220 MG tablet Take 440 mg by mouth 2 (two) times daily as needed (for arthritis pain).  Marland Kitchen OVER THE COUNTER MEDICATION Take 400 mg by mouth 2 (two) times daily. Magnical/D Take 1 tablet by mouth twice a day  . potassium chloride (KLOR-CON M10) 10 MEQ tablet Take 1 tablet (10 mEq total) daily by mouth.  Marland Kitchen PRADAXA 150 MG CAPS capsule TAKE ONE CAPSULE BY MOUTH TWICE A DAY  . rOPINIRole (REQUIP) 1 MG tablet Take 1 mg by mouth at bedtime.    Marland Kitchen VYVANSE 70 MG capsule Take 70 mg by mouth Daily.    No facility-administered encounter medications on file as of 07/17/2017.     Activities of Daily Living No flowsheet data found.  Patient Care Team: Patient, No Pcp Per as PCP - General (General Practice)    Assessment:   This is a routine wellness examination for Rose Ball. Physical assessment deferred to PCP.   Exercise Activities and Dietary recommendations   Diet (meal preparation, eat out, water intake, caffeinated beverages, dairy products, fruits and vegetables): {Desc;  diets:16563}   Goals    None      Cognitive Function        Immunization History  Administered Date(s) Administered  . Influenza,inj,Quad PF,6+ Mos 02/14/2013    Screening Tests Health Maintenance  Topic Date Due  . Hepatitis C Screening  [birth year]  . TETANUS/TDAP  01/08/1966  . COLONOSCOPY  01/08/1997  . DEXA SCAN  01/09/2012  . PNA vac Low Risk Adult (1 of 2 - PCV13) 01/09/2012  . MAMMOGRAM  05/27/2016  . INFLUENZA VACCINE  10/31/2017      Plan:    I have personally reviewed and noted the following in the patient's chart:   . Medical and social history . Use of alcohol, tobacco or illicit drugs  .  Current medications and supplements . Functional ability and status . Nutritional status . Physical activity . Advanced directives . List of other physicians . Vitals . Screenings to include cognitive, depression, and falls . Referrals and appointments  In addition, I have reviewed and discussed with patient certain preventive protocols, quality metrics, and best practice recommendations. A written personalized care plan for preventive services as well as general preventive health recommendations were provided to patient.     Michiel Cowboy, RN  07/17/2017

## 2017-08-05 DIAGNOSIS — M25562 Pain in left knee: Secondary | ICD-10-CM | POA: Diagnosis not present

## 2017-09-06 ENCOUNTER — Other Ambulatory Visit: Payer: Self-pay | Admitting: Nurse Practitioner

## 2017-10-04 ENCOUNTER — Other Ambulatory Visit: Payer: Self-pay | Admitting: Nurse Practitioner

## 2017-10-07 ENCOUNTER — Other Ambulatory Visit: Payer: Self-pay | Admitting: Nurse Practitioner

## 2017-11-05 ENCOUNTER — Other Ambulatory Visit: Payer: Self-pay | Admitting: Nurse Practitioner

## 2017-12-10 ENCOUNTER — Other Ambulatory Visit: Payer: Self-pay | Admitting: Nurse Practitioner

## 2017-12-11 ENCOUNTER — Other Ambulatory Visit (HOSPITAL_COMMUNITY): Payer: Self-pay | Admitting: *Deleted

## 2017-12-11 MED ORDER — DABIGATRAN ETEXILATE MESYLATE 150 MG PO CAPS: ORAL_CAPSULE | ORAL | 0 refills | Status: DC

## 2017-12-15 ENCOUNTER — Other Ambulatory Visit: Payer: Self-pay | Admitting: Nurse Practitioner

## 2017-12-17 ENCOUNTER — Ambulatory Visit (HOSPITAL_COMMUNITY)
Admission: RE | Admit: 2017-12-17 | Discharge: 2017-12-17 | Disposition: A | Discharge status: Home / Self Care | Payer: PPO | Source: Ambulatory Visit | Attending: Nurse Practitioner | Admitting: Nurse Practitioner

## 2017-12-17 ENCOUNTER — Encounter (HOSPITAL_COMMUNITY): Payer: Self-pay | Admitting: Nurse Practitioner

## 2017-12-17 VITALS — BP 154/72 | HR 55 | Ht 69.0 in | Wt 179.0 lb

## 2017-12-17 DIAGNOSIS — Z79899 Other long term (current) drug therapy: Secondary | ICD-10-CM | POA: Insufficient documentation

## 2017-12-17 DIAGNOSIS — I480 Paroxysmal atrial fibrillation: Secondary | ICD-10-CM | POA: Insufficient documentation

## 2017-12-17 DIAGNOSIS — Z8249 Family history of ischemic heart disease and other diseases of the circulatory system: Secondary | ICD-10-CM | POA: Insufficient documentation

## 2017-12-17 DIAGNOSIS — I4891 Unspecified atrial fibrillation: Secondary | ICD-10-CM | POA: Diagnosis present

## 2017-12-17 DIAGNOSIS — I440 Atrioventricular block, first degree: Secondary | ICD-10-CM | POA: Insufficient documentation

## 2017-12-17 DIAGNOSIS — E785 Hyperlipidemia, unspecified: Secondary | ICD-10-CM | POA: Insufficient documentation

## 2017-12-17 DIAGNOSIS — I10 Essential (primary) hypertension: Secondary | ICD-10-CM | POA: Diagnosis not present

## 2017-12-17 DIAGNOSIS — F329 Major depressive disorder, single episode, unspecified: Secondary | ICD-10-CM | POA: Diagnosis not present

## 2017-12-17 DIAGNOSIS — Z9889 Other specified postprocedural states: Secondary | ICD-10-CM | POA: Diagnosis not present

## 2017-12-17 DIAGNOSIS — R001 Bradycardia, unspecified: Secondary | ICD-10-CM | POA: Insufficient documentation

## 2017-12-17 DIAGNOSIS — Z7901 Long term (current) use of anticoagulants: Secondary | ICD-10-CM | POA: Insufficient documentation

## 2017-12-17 LAB — BASIC METABOLIC PANEL
ANION GAP: 8 (ref 5–15)
BUN: 30 mg/dL — ABNORMAL HIGH (ref 8–23)
CO2: 26 mmol/L (ref 22–32)
Calcium: 8.9 mg/dL (ref 8.9–10.3)
Chloride: 107 mmol/L (ref 98–111)
Creatinine, Ser: 1.56 mg/dL — ABNORMAL HIGH (ref 0.44–1.00)
GFR, EST AFRICAN AMERICAN: 38 mL/min — AB (ref >60)
GFR, EST NON AFRICAN AMERICAN: 33 mL/min — AB (ref >60)
Glucose, Bld: 90 mg/dL (ref 70–99)
POTASSIUM: 4.7 mmol/L (ref 3.5–5.1)
Sodium: 141 mmol/L (ref 135–145)

## 2017-12-17 LAB — MAGNESIUM: Magnesium: 2.3 mg/dL (ref 1.7–2.4)

## 2017-12-17 MED ORDER — DOFETILIDE 500 MCG PO CAPS: 500.0000 ug | ORAL_CAPSULE | Freq: Two times a day (BID) | ORAL | 6 refills | Status: DC

## 2017-12-17 MED ORDER — DABIGATRAN ETEXILATE MESYLATE 150 MG PO CAPS: ORAL_CAPSULE | ORAL | 3 refills | Status: DC

## 2017-12-17 NOTE — Progress Notes (Signed)
Patient ID: Rose Ball, female   DOB: 1945/06/03, 72 y.o.   MRN: [id number]     Primary Care Physician: Patient, No Pcp Per Referring Physician: Dr. Nyoka Lint is a 72 y.o. female with a h/o afib, s/p convergent procedure 2013, on tikosyn. She is in the clinic for f/u. She reports that she is staying in [place] [employer]. Being compliant with dofetilide and pradaxa. No bleeding issues. She reports that she has lost 60 lbs over the last 6-9 months by changing her diet. She is questioning if she needs to stay on Tikosyn. She still has not established with a PCP, states that she has never been to a PCP.  Today, she denies symptoms of palpitations, chest pain, shortness of breath, orthopnea, PND, lower extremity edema, dizziness, presyncope, syncope, or neurologic sequela. The patient is tolerating medications without difficulties and is otherwise without complaint today.   Past Medical History:  Diagnosis Date  . Attention deficit    on Vyvanse  . Chronic anticoagulation   . Depression   . Dyslipidemia   . Hypertension   . Hypokalemia   . Morbid obesity ([place])   . Palpitations   . Persistent atrial fibrillation ([place]) 05/27/14 Chadsvasc score of 4   s/p cardioversion Sept 2012; failed   Past Surgical History:  Procedure Laterality Date  . BREAST BIOPSY    . CARDIOVERSION  Sept 2012   failed  . CARDIOVERSION N/A 09/08/2012   Procedure: CARDIOVERSION;  Surgeon: Fay Records, MD;  Location: [facility] ENDOSCOPY;  Service: Cardiovascular;  Laterality: N/A;  . CARDIOVERSION N/A 02/15/2013   Procedure: CARDIOVERSION-BEDSIDE;  Surgeon: Darlin Coco, MD;  Location: [place];  Service: Cardiovascular;  Laterality: N/A;  . CARDIOVERSION N/A 02/05/2011   Procedure: CARDIOVERSION;  Surgeon: Evans Lance, MD;  Location: [facility] CATH LAB;  Service: Cardiovascular;  Laterality: N/A;  . Convergent procedure  11/13/11   afib ablation at [facility]  . EP IMPLANTABLE DEVICE N/A 02/09/2015   Procedure: Loop  Recorder Removal;  Surgeon: [name], MD;  Location: [place] CV LAB;  Service: Cardiovascular;  Laterality: N/A;  . loop recorder insertion Left 09/2011  . Loop recorder removal  02/09/15   MDT Reveal XT loop recorder removed by Dr Rayann Heman  . TEE WITHOUT CARDIOVERSION N/A 09/08/2012   Procedure: TRANSESOPHAGEAL ECHOCARDIOGRAM (TEE);  Surgeon: Fay Records, MD;  Location: [facility] ENDOSCOPY;  Service: Cardiovascular;  Laterality: N/A;  . TONSILLECTOMY    . TONSILLECTOMY    . US ECHOCARDIOGRAPHY  Aug 3149   Normal systolic function with EF 55% and slight LAE at 4.4cm    Current Outpatient Medications  Medication Sig Dispense Refill  . amphetamine-dextroamphetamine (ADDERALL) 15 MG tablet Take 15 mg by mouth daily as needed (for additional focus).     Marland Kitchen atorvastatin (LIPITOR) 10 MG tablet TAKE 1 TABLET BY MOUTH EVERY DAY 30 tablet 6  . dabigatran (PRADAXA) 150 MG CAPS capsule TAKE 1 CAPSULE (150 MG TOTAL) BY MOUTH 2 (TWO) TIMES DAILY. 60 capsule 3  . dofetilide (TIKOSYN) 500 MCG capsule Take 1 capsule (500 mcg total) by mouth 2 (two) times daily. 60 capsule 6  . escitalopram (LEXAPRO) 20 MG tablet Take 20 mg by mouth Daily. Lexapro    . KLOR-CON M10 10 MEQ tablet TAKE 1 TABLET (10 MEQ TOTAL) BY MOUTH DAILY. NO REFILLS UNTIL APPT [phone] 30 tablet 0  . metoprolol tartrate (LOPRESSOR) 50 MG tablet TAKE 1 TABLET BY MOUTH TWICE A DAY 60 tablet 6  .  naproxen sodium (ANAPROX) 220 MG tablet Take 440 mg by mouth 2 (two) times daily as needed (for arthritis pain).    Marland Kitchen OVER THE COUNTER MEDICATION Take 400 mg by mouth 2 (two) times daily. Magnical/D Take 1 tablet by mouth twice a day    . rOPINIRole (REQUIP) 1 MG tablet Take 1 mg by mouth at bedtime.      Marland Kitchen VYVANSE 70 MG capsule Take 70 mg by mouth Daily.      No current facility-administered medications for this encounter.     Allergies  Allergen Reactions  . Amiodarone Other (See Comments)    REACTION: dyspnea    Social History    Socioeconomic History  . Marital status: Married    Spouse name: Not on file  . Number of children: Not on file  . Years of education: Not on file  . Highest education level: Not on file  Occupational History  . Not on file  Social Needs  . Financial resource strain: Not on file  . Food insecurity:    Worry: Not on file    Inability: Not on file  . Transportation needs:    Medical: Not on file    Non-medical: Not on file  Tobacco Use  . Smoking status: Never Smoker  . Smokeless tobacco: Never Used  Substance and Sexual Activity  . Alcohol use: No  . Drug use: No  . Sexual activity: Not on file  Lifestyle  . Physical activity:    Days per week: Not on file    Minutes per session: Not on file  . Stress: Not on file  Relationships  . Social connections:    Talks on phone: Not on file    Gets together: Not on file    Attends religious service: Not on file    Active member of club or organization: Not on file    Attends meetings of clubs or organizations: Not on file    Relationship status: Not on file  . Intimate partner violence:    Fear of current or ex partner: Not on file    Emotionally abused: Not on file    Physically abused: Not on file    Forced sexual activity: Not on file  Other Topics Concern  . Not on file  Social History Narrative   Pt lives in [place] alone.  Works at [employer]    Family History  Problem Relation Age of Onset  . Coronary artery disease Father     ROS- All systems are reviewed and negative except as per the HPI above  Physical Exam: Vitals:   12/17/17 1451  BP: (!) 154/72  Pulse: (!) 55  Weight: 81.2 kg  Height: 5\' 9"  (1.753 m)    GEN- The patient is well appearing, alert and oriented x 3 today.   Head- normocephalic, atraumatic Eyes-  Sclera clear, conjunctiva pink Ears- hearing intact Oropharynx- clear Neck- supple, no JVP Lymph- no cervical lymphadenopathy Lungs- Clear to ausculation  bilaterally, normal work of breathing Heart- Regular rate and rhythm, no murmurs, rubs or gallops, PMI not laterally displaced GI- soft, NT, ND, + BS Extremities- no clubbing, cyanosis, or edema MS- no significant deformity or atrophy Skin- no rash or lesion Psych- euthymic mood, full affect Neuro- strength and sensation are intact  EKG- Sinus brady at 55 bpm, pr int 218 ms, qrs int 88 ms, qtc 470 ms Epic records reviewed  Assessment and Plan: 1. Persistent  AFib Maintaining SR with  dofetilide, continue 500 mcg bid Continue pradaxa with a chadsvasc score of  at least 3 Bmet/mag today She is questioning with the intentional weight loss of 60 lbs, she may be able to come off dofetilide She is not willing to make a change yet but is interested in discussing  with Dr. Rayann Heman   2. HTN Stable   She STILL  DOES NOT  have a PCP and names of  General MD offices were given to pt on last visit and today, encouraged to get established  F/u with Dr. Rayann Heman in 3 months      Geroge Baseman. Alister Staver, [facility] [address] [place], [place] [address] [phone]

## 2017-12-17 NOTE — Patient Instructions (Signed)
   Our newest location at [facility] offers primary care, sports medicine and behavioral medicine for patients from our trusted team of providers. [place], [place]  Phone: [phone]   Scheduling will call you to set up appointment with Dr. Rayann Heman in 3 months

## 2017-12-31 ENCOUNTER — Telehealth (HOSPITAL_COMMUNITY): Payer: Self-pay | Admitting: *Deleted

## 2017-12-31 ENCOUNTER — Other Ambulatory Visit: Payer: Self-pay

## 2017-12-31 ENCOUNTER — Encounter (HOSPITAL_COMMUNITY): Payer: Self-pay | Admitting: Emergency Medicine

## 2017-12-31 ENCOUNTER — Emergency Department (HOSPITAL_COMMUNITY): Payer: PPO

## 2017-12-31 ENCOUNTER — Ambulatory Visit (HOSPITAL_COMMUNITY): Payer: PPO | Admitting: Nurse Practitioner

## 2017-12-31 ENCOUNTER — Inpatient Hospital Stay (HOSPITAL_COMMUNITY)
Admission: EM | Admit: 2017-12-31 | Discharge: 2018-01-03 | DRG: 378 | Disposition: A | Discharge status: Home / Self Care | Payer: PPO | Attending: Internal Medicine | Admitting: Internal Medicine

## 2017-12-31 DIAGNOSIS — K264 Chronic or unspecified duodenal ulcer with hemorrhage: Secondary | ICD-10-CM | POA: Diagnosis present

## 2017-12-31 DIAGNOSIS — I10 Essential (primary) hypertension: Secondary | ICD-10-CM | POA: Diagnosis not present

## 2017-12-31 DIAGNOSIS — R402254 Coma scale, best verbal response, oriented, 24 hours or more after hospital admission: Secondary | ICD-10-CM | POA: Diagnosis not present

## 2017-12-31 DIAGNOSIS — K2970 Gastritis, unspecified, without bleeding: Secondary | ICD-10-CM | POA: Diagnosis not present

## 2017-12-31 DIAGNOSIS — K648 Other hemorrhoids: Secondary | ICD-10-CM | POA: Diagnosis present

## 2017-12-31 DIAGNOSIS — K449 Diaphragmatic hernia without obstruction or gangrene: Secondary | ICD-10-CM | POA: Diagnosis present

## 2017-12-31 DIAGNOSIS — N179 Acute kidney failure, unspecified: Secondary | ICD-10-CM | POA: Diagnosis not present

## 2017-12-31 DIAGNOSIS — R402144 Coma scale, eyes open, spontaneous, 24 hours or more after hospital admission: Secondary | ICD-10-CM | POA: Diagnosis not present

## 2017-12-31 DIAGNOSIS — E785 Hyperlipidemia, unspecified: Secondary | ICD-10-CM | POA: Diagnosis present

## 2017-12-31 DIAGNOSIS — K635 Polyp of colon: Secondary | ICD-10-CM | POA: Diagnosis present

## 2017-12-31 DIAGNOSIS — K922 Gastrointestinal hemorrhage, unspecified: Secondary | ICD-10-CM | POA: Diagnosis not present

## 2017-12-31 DIAGNOSIS — N183 Chronic kidney disease, stage 3 (moderate): Secondary | ICD-10-CM | POA: Diagnosis present

## 2017-12-31 DIAGNOSIS — I4891 Unspecified atrial fibrillation: Secondary | ICD-10-CM | POA: Diagnosis not present

## 2017-12-31 DIAGNOSIS — D500 Iron deficiency anemia secondary to blood loss (chronic): Secondary | ICD-10-CM

## 2017-12-31 DIAGNOSIS — R0602 Shortness of breath: Secondary | ICD-10-CM | POA: Diagnosis not present

## 2017-12-31 DIAGNOSIS — Z7901 Long term (current) use of anticoagulants: Secondary | ICD-10-CM | POA: Diagnosis not present

## 2017-12-31 DIAGNOSIS — R402364 Coma scale, best motor response, obeys commands, 24 hours or more after hospital admission: Secondary | ICD-10-CM | POA: Diagnosis not present

## 2017-12-31 DIAGNOSIS — K269 Duodenal ulcer, unspecified as acute or chronic, without hemorrhage or perforation: Secondary | ICD-10-CM | POA: Diagnosis not present

## 2017-12-31 DIAGNOSIS — K222 Esophageal obstruction: Secondary | ICD-10-CM | POA: Diagnosis present

## 2017-12-31 DIAGNOSIS — Z23 Encounter for immunization: Secondary | ICD-10-CM

## 2017-12-31 DIAGNOSIS — Z95818 Presence of other cardiac implants and grafts: Secondary | ICD-10-CM

## 2017-12-31 DIAGNOSIS — D122 Benign neoplasm of ascending colon: Secondary | ICD-10-CM | POA: Diagnosis not present

## 2017-12-31 DIAGNOSIS — K254 Chronic or unspecified gastric ulcer with hemorrhage: Secondary | ICD-10-CM | POA: Diagnosis present

## 2017-12-31 DIAGNOSIS — F329 Major depressive disorder, single episode, unspecified: Secondary | ICD-10-CM | POA: Diagnosis present

## 2017-12-31 DIAGNOSIS — I129 Hypertensive chronic kidney disease with stage 1 through stage 4 chronic kidney disease, or unspecified chronic kidney disease: Secondary | ICD-10-CM | POA: Diagnosis present

## 2017-12-31 DIAGNOSIS — Z888 Allergy status to other drugs, medicaments and biological substances status: Secondary | ICD-10-CM

## 2017-12-31 DIAGNOSIS — F909 Attention-deficit hyperactivity disorder, unspecified type: Secondary | ICD-10-CM | POA: Diagnosis present

## 2017-12-31 DIAGNOSIS — Z800 Family history of malignant neoplasm of digestive organs: Secondary | ICD-10-CM | POA: Diagnosis not present

## 2017-12-31 DIAGNOSIS — M549 Dorsalgia, unspecified: Secondary | ICD-10-CM | POA: Diagnosis present

## 2017-12-31 DIAGNOSIS — R634 Abnormal weight loss: Secondary | ICD-10-CM | POA: Diagnosis present

## 2017-12-31 DIAGNOSIS — D649 Anemia, unspecified: Secondary | ICD-10-CM | POA: Diagnosis not present

## 2017-12-31 DIAGNOSIS — R42 Dizziness and giddiness: Secondary | ICD-10-CM | POA: Diagnosis not present

## 2017-12-31 DIAGNOSIS — G8929 Other chronic pain: Secondary | ICD-10-CM | POA: Diagnosis present

## 2017-12-31 DIAGNOSIS — T39395A Adverse effect of other nonsteroidal anti-inflammatory drugs [NSAID], initial encounter: Secondary | ICD-10-CM | POA: Diagnosis present

## 2017-12-31 DIAGNOSIS — D62 Acute posthemorrhagic anemia: Secondary | ICD-10-CM | POA: Diagnosis present

## 2017-12-31 DIAGNOSIS — K2951 Unspecified chronic gastritis with bleeding: Secondary | ICD-10-CM | POA: Diagnosis not present

## 2017-12-31 DIAGNOSIS — I4819 Other persistent atrial fibrillation: Secondary | ICD-10-CM | POA: Diagnosis present

## 2017-12-31 DIAGNOSIS — Z1211 Encounter for screening for malignant neoplasm of colon: Secondary | ICD-10-CM | POA: Diagnosis not present

## 2017-12-31 DIAGNOSIS — K5730 Diverticulosis of large intestine without perforation or abscess without bleeding: Secondary | ICD-10-CM | POA: Diagnosis present

## 2017-12-31 DIAGNOSIS — Z79899 Other long term (current) drug therapy: Secondary | ICD-10-CM

## 2017-12-31 DIAGNOSIS — K259 Gastric ulcer, unspecified as acute or chronic, without hemorrhage or perforation: Secondary | ICD-10-CM | POA: Diagnosis not present

## 2017-12-31 DIAGNOSIS — Z6824 Body mass index (BMI) 24.0-24.9, adult: Secondary | ICD-10-CM

## 2017-12-31 LAB — BASIC METABOLIC PANEL
Anion gap: 10 (ref 5–15)
BUN: 86 mg/dL — ABNORMAL HIGH (ref 8–23)
CALCIUM: 8.9 mg/dL (ref 8.9–10.3)
CO2: 19 mmol/L — AB (ref 22–32)
CREATININE: 1.76 mg/dL — AB (ref 0.44–1.00)
Chloride: 110 mmol/L (ref 98–111)
GFR, EST AFRICAN AMERICAN: 33 mL/min — AB (ref >60)
GFR, EST NON AFRICAN AMERICAN: 28 mL/min — AB (ref >60)
Glucose, Bld: 163 mg/dL — ABNORMAL HIGH (ref 70–99)
Potassium: 4.2 mmol/L (ref 3.5–5.1)
SODIUM: 139 mmol/L (ref 135–145)

## 2017-12-31 LAB — HEPATIC FUNCTION PANEL
ALBUMIN: 3.4 g/dL — AB (ref 3.5–5.0)
ALT: 17 U/L (ref 0–44)
AST: 18 U/L (ref 15–41)
Alkaline Phosphatase: 52 U/L (ref 38–126)
Bilirubin, Direct: <0.1 mg/dL (ref 0.0–0.2)
TOTAL PROTEIN: 5.9 g/dL — AB (ref 6.5–8.1)
Total Bilirubin: 0.3 mg/dL (ref 0.3–1.2)

## 2017-12-31 LAB — CBC
HEMATOCRIT: 24.8 % — AB (ref 36.0–46.0)
HEMOGLOBIN: 7.8 g/dL — AB (ref 12.0–15.0)
MCH: 29 pg (ref 26.0–34.0)
MCHC: 31.5 g/dL (ref 30.0–36.0)
MCV: 92.2 fL (ref 78.0–100.0)
Platelets: 326 10*3/uL (ref 150–400)
RBC: 2.69 MIL/uL — ABNORMAL LOW (ref 3.87–5.11)
RDW: 14.3 % (ref 11.5–15.5)
WBC: 13.6 10*3/uL — AB (ref 4.0–10.5)

## 2017-12-31 LAB — OCCULT BLOOD GASTRIC / DUODENUM (SPECIMEN CUP): Occult Blood, Gastric: POSITIVE — AB

## 2017-12-31 LAB — PROTIME-INR
INR: 1.59
PROTHROMBIN TIME: 18.9 s — AB (ref 11.4–15.2)

## 2017-12-31 LAB — LIPASE, BLOOD: LIPASE: 36 U/L (ref 11–51)

## 2017-12-31 LAB — I-STAT TROPONIN, ED: Troponin i, poc: 0.02 ng/mL (ref 0.00–0.08)

## 2017-12-31 MED ORDER — SODIUM CHLORIDE 0.9% IV SOLUTION
Freq: Once | INTRAVENOUS | Status: AC
  Administered 2017-12-31: 23:00:00 via INTRAVENOUS

## 2017-12-31 MED ORDER — SODIUM CHLORIDE 0.9 % IV SOLN
8.0000 mg/h | INTRAVENOUS | Status: DC
  Administered 2017-12-31 – 2018-01-03 (×6): 8 mg/h via INTRAVENOUS
  Filled 2017-12-31 (×10): qty 80

## 2017-12-31 MED ORDER — DOFETILIDE 500 MCG PO CAPS
500.0000 ug | ORAL_CAPSULE | Freq: Once | ORAL | Status: AC
  Administered 2017-12-31: 500 ug via ORAL
  Filled 2017-12-31: qty 1

## 2017-12-31 MED ORDER — SODIUM CHLORIDE 0.9 % IV SOLN
80.0000 mg | Freq: Once | INTRAVENOUS | Status: AC
  Administered 2017-12-31: 80 mg via INTRAVENOUS
  Filled 2017-12-31: qty 80

## 2017-12-31 NOTE — ED Notes (Signed)
Unable to get temp @ this time.

## 2017-12-31 NOTE — Telephone Encounter (Signed)
Patient called in this afternoon stating the last 2 days she has not felt as well. Feeling more short of breath with exertion. HR 67 BP 155/80. She denies weight gain/swelling or abdominal bloating, chest pain. Patient was given an appointment this afternoon for assessment but called 15 mins prior to the appointment stating she couldn't find transportation and will call back tomorrow if not feeling better.

## 2017-12-31 NOTE — ED Triage Notes (Signed)
Pt reports SHOB and feeling near-syncopal on exertion. Pt reports some CP. Pt reports hx of A-fib, feels like her heart rate increases on exertion. Pt reports symptoms started yesterday.

## 2017-12-31 NOTE — ED Provider Notes (Addendum)
[place] EMERGENCY DEPARTMENT Provider Note   CSN: [id number] Arrival date & time: 12/31/17  1816     History   Chief Complaint Chief Complaint  Patient presents with  . Shortness of Breath    HPI Rose Ball is a 72 y.o. female.  HPI   72 year old female here with generalized weakness.  The patient has a history of A. fib.  She states that over the last 2 days, she has noticed progressively worsening weakness and dyspnea with exertion.  Throughout the day today, she has noticed palpitations and sensation of lightheadedness like she is going to pass out anytime she tries to exert herself.  Denies any shortness of breath or chest pain at rest.  Denies any pain, but does endorse nausea and has had 2 black episodes of emesis.  Denies any overt blood in her stool.  No history of ulcers.  Of note, the patient does admit to taking naproxen for the last several days, for pain related to her gardening.  Denies any abdominal pain.  No bright red blood in the emesis or diarrhea.  No other complaints.  She does not have a GI physician.  Past Medical History:  Diagnosis Date  . Attention deficit    on Vyvanse  . Chronic anticoagulation   . Depression   . Dyslipidemia   . Hypertension   . Hypokalemia   . Morbid obesity ([place])   . Palpitations   . Persistent atrial fibrillation 05/27/14 Chadsvasc score of 4   s/p cardioversion Sept 2012; failed    Patient Active Problem List   Diagnosis Date Noted  . Preop cardiovascular exam 06/03/2013  . Chronic anticoagulation, on Pradaxa 02/16/2013  . Persistent atrial fibrillation   . Obesity 12/29/2010  . Attention deficit disorder with hyperactivity syndrome 08/30/2010  . Hypertension   . Dyslipidemia   . Atrial fibrillation ([place]) 07/21/2010  . Long term (current) use of anticoagulants 07/21/2010    Past Surgical History:  Procedure Laterality Date  . BREAST BIOPSY    . CARDIOVERSION  Sept 2012   failed  .  CARDIOVERSION N/A 09/08/2012   Procedure: CARDIOVERSION;  Surgeon: Fay Records, MD;  Location: [facility] ENDOSCOPY;  Service: Cardiovascular;  Laterality: N/A;  . CARDIOVERSION N/A 02/15/2013   Procedure: CARDIOVERSION-BEDSIDE;  Surgeon: Darlin Coco, MD;  Location: [place];  Service: Cardiovascular;  Laterality: N/A;  . CARDIOVERSION N/A 02/05/2011   Procedure: CARDIOVERSION;  Surgeon: Evans Lance, MD;  Location: [facility] CATH LAB;  Service: Cardiovascular;  Laterality: N/A;  . Convergent procedure  11/13/11   afib ablation at [facility]  . EP IMPLANTABLE DEVICE N/A 02/09/2015   Procedure: Loop Recorder Removal;  Surgeon: [name], MD;  Location: [place] CV LAB;  Service: Cardiovascular;  Laterality: N/A;  . loop recorder insertion Left 09/2011  . Loop recorder removal  02/09/15   MDT Reveal XT loop recorder removed by Dr Rayann Heman  . TEE WITHOUT CARDIOVERSION N/A 09/08/2012   Procedure: TRANSESOPHAGEAL ECHOCARDIOGRAM (TEE);  Surgeon: Fay Records, MD;  Location: [facility] ENDOSCOPY;  Service: Cardiovascular;  Laterality: N/A;  . TONSILLECTOMY    . TONSILLECTOMY    . US ECHOCARDIOGRAPHY  Aug 9833   Normal systolic function with EF 55% and slight LAE at 4.4cm     OB History   None      Home Medications    Prior to Admission medications   Medication Sig Start Date End Date Taking? Authorizing Provider  amphetamine-dextroamphetamine (ADDERALL) 15 MG  tablet Take 15 mg by mouth daily as needed (for additional focus).  04/02/11  Yes [provider]  atorvastatin (LIPITOR) 10 MG tablet TAKE 1 TABLET BY MOUTH EVERY DAY Patient taking differently: Take 10 mg by mouth daily at 6 PM.  06/06/17  Yes Sherran Needs, NP  dabigatran (PRADAXA) 150 MG CAPS capsule TAKE 1 CAPSULE (150 MG TOTAL) BY MOUTH 2 (TWO) TIMES DAILY. Patient taking differently: Take 150 mg by mouth 2 (two) times daily.  12/17/17  Yes Sherran Needs, NP  dofetilide (TIKOSYN) 500 MCG capsule Take 1 capsule (500 mcg total) by mouth 2 (two)  times daily. 12/17/17  Yes Sherran Needs, NP  escitalopram (LEXAPRO) 20 MG tablet Take 20 mg by mouth Daily. Lexapro 07/07/10  Yes [provider]  KLOR-CON M10 10 MEQ tablet TAKE 1 TABLET (10 MEQ TOTAL) BY MOUTH DAILY. NO REFILLS UNTIL APPT [phone] Patient taking differently: Take 10 mEq by mouth every morning.  12/11/17  Yes Sherran Needs, NP  metoprolol tartrate (LOPRESSOR) 50 MG tablet TAKE 1 TABLET BY MOUTH TWICE A DAY Patient taking differently: Take 50 mg by mouth 2 (two) times daily.  06/03/17  Yes Sherran Needs, NP  naproxen sodium (ANAPROX) 220 MG tablet Take 440 mg by mouth 2 (two) times daily as needed (for arthritis pain).   Yes [provider]  OVER THE COUNTER MEDICATION Take 400 mg by mouth 2 (two) times daily. Magnical/D Take 1 tablet by mouth twice a day   Yes [provider]  rOPINIRole (REQUIP) 1 MG tablet Take 1 mg by mouth at bedtime.     Yes [provider]  VYVANSE 70 MG capsule Take 70 mg by mouth Daily.  07/11/10  Yes [provider]    Family History Family History  Problem Relation Age of Onset  . Coronary artery disease Father     Social History Social History   Tobacco Use  . Smoking status: Never Smoker  . Smokeless tobacco: Never Used  Substance Use Topics  . Alcohol use: No  . Drug use: No     Allergies   Amiodarone   Review of Systems Review of Systems  Constitutional: Positive for fatigue. Negative for chills and fever.  HENT: Negative for congestion and rhinorrhea.   Eyes: Negative for visual disturbance.  Respiratory: Positive for shortness of breath. Negative for cough and wheezing.   Cardiovascular: Positive for palpitations. Negative for chest pain and leg swelling.  Gastrointestinal: Negative for abdominal pain, diarrhea, nausea and vomiting.  Genitourinary: Negative for dysuria and flank pain.  Musculoskeletal: Negative for neck pain and neck stiffness.  Skin: Negative for rash  and wound.  Allergic/Immunologic: Negative for immunocompromised state.  Neurological: Positive for weakness and light-headedness. Negative for syncope and headaches.  All other systems reviewed and are negative.    Physical Exam Updated Vital Signs BP (!) 153/78   Pulse 96   Resp 20   Ht 5\' 9"  (1.753 m)   Wt 81.2 kg   SpO2 100%   BMI 26.43 kg/m   Physical Exam  Constitutional: She is oriented to person, place, and time. She appears well-developed and well-nourished. No distress.  HENT:  Head: Normocephalic and atraumatic.  Eyes: Conjunctivae are normal.  Neck: Neck supple.  Cardiovascular: Normal rate, regular rhythm and normal heart sounds. Exam reveals no friction rub.  No murmur heard. Pulmonary/Chest: Effort normal and breath sounds normal. No respiratory distress. She has no wheezes. She has no  rales.  Abdominal: She exhibits no distension.  Genitourinary:  Genitourinary Comments: No bright red blood per rectum.  No abdominal tenderness.  Musculoskeletal: She exhibits no edema.  Neurological: She is alert and oriented to person, place, and time. She exhibits normal muscle tone.  Skin: Skin is warm. Capillary refill takes less than 2 seconds.  Psychiatric: She has a normal mood and affect.  Nursing note and vitals reviewed.    ED Treatments / Results  Labs (all labs ordered are listed, but only abnormal results are displayed) Labs Reviewed  BASIC METABOLIC PANEL - Abnormal; Notable for the following components:      Result Value   CO2 19 (*)    Glucose, Bld 163 (*)    BUN 86 (*)    Creatinine, Ser 1.76 (*)    GFR calc non Af Amer 28 (*)    GFR calc Af Amer 33 (*)    All other components within normal limits  CBC - Abnormal; Notable for the following components:   WBC 13.6 (*)    RBC 2.69 (*)    Hemoglobin 7.8 (*)    HCT 24.8 (*)    All other components within normal limits  PROTIME-INR - Abnormal; Notable for the following components:   Prothrombin Time  18.9 (*)    All other components within normal limits  HEPATIC FUNCTION PANEL - Abnormal; Notable for the following components:   Total Protein 5.9 (*)    Albumin 3.4 (*)    All other components within normal limits  OCCULT BLOOD GASTRIC / DUODENUM (SPECIMEN CUP) - Abnormal; Notable for the following components:   Occult Blood, Gastric POSITIVE (*)    All other components within normal limits  LIPASE, BLOOD  I-STAT TROPONIN, ED  POC OCCULT BLOOD, ED  PREPARE RBC (CROSSMATCH)    EKG EKG Interpretation  Date/Time:  Tuesday December 31 2017 18:20:44 EDT Ventricular Rate:  78 PR Interval:  186 QRS Duration: 82 QT Interval:  430 QTC Calculation: 490 R Axis:   63 Text Interpretation:  Normal sinus rhythm Prolonged QT Abnormal ECG No significant change since last tracing Confirmed by Duffy Bruce [id number]) on 12/31/2017 10:11:10 PM   Radiology Dg Chest 2 View  Result Date: 12/31/2017 CLINICAL DATA:  Short of breath EXAM: CHEST - 2 VIEW COMPARISON:  12/18/2010 FINDINGS: Small focus of atelectasis or scar at the left base. No consolidation or effusion. Normal heart size. No pneumothorax. In oval nodular opacity projects slightly inferior as compared to 2012 study, this was thought to reflect breast calcification. IMPRESSION: No active cardiopulmonary disease. Electronically Signed   By: Donavan Foil M.D.   On: 12/31/2017 19:28    Procedures .Critical Care Performed by: Duffy Bruce, MD Authorized by: Duffy Bruce, MD   Critical care provider statement:    Critical care time (minutes):  35   Critical care time was exclusive of:  Separately billable procedures and treating other patients and teaching time   Critical care was necessary to treat or prevent imminent or life-threatening deterioration of the following conditions:  Cardiac failure, circulatory failure and respiratory failure   Critical care was time spent personally by me on the following activities:  Development of  treatment plan with patient or surrogate, discussions with consultants, evaluation of patient's response to treatment, examination of patient, obtaining history from patient or surrogate, ordering and performing treatments and interventions, ordering and review of laboratory studies, ordering and review of radiographic studies, pulse oximetry, re-evaluation of patient's condition and review  of old charts   I assumed direction of critical care for this patient from another provider in my specialty: no     (including critical care time)  Medications Ordered in ED Medications  pantoprazole (PROTONIX) 80 mg in sodium chloride 0.9 % 100 mL IVPB (80 mg Intravenous New Bag/Given 12/31/17 2333)  pantoprazole (PROTONIX) 80 mg in sodium chloride 0.9 % 250 mL (0.32 mg/mL) infusion (has no administration in time range)  0.9 %  sodium chloride infusion (Manually program via Guardrails IV Fluids) ( Intravenous New Bag/Given 12/31/17 2233)  dofetilide (TIKOSYN) capsule 500 mcg (500 mcg Oral Given 12/31/17 2338)     Initial Impression / Assessment and Plan / ED Course  I have reviewed the triage vital signs and the nursing notes.  Pertinent labs & imaging results that were available during my care of the patient were reviewed by me and considered in my medical decision making (see chart for details).    72 year old female here with dyspnea with exertion, bloody emesis.  Concern for upper GI bleed complicated by anticoagulant use.  She is hemodynamically stable here in greater than 12 hours since her last Pradaxa dose.  Will hold on emergent reversal.  Lab work shows markedly elevated BUN likely due to upper GI bleed.  Discussed with [facility] on-call, who will see the patient in the morning.  Patient hemoglobin less than 8 with significant symptomatic anemia so will transfuse 1 unit.  Type and screen sent.  Admit to medicine.  Final Clinical Impressions(s) / ED Diagnoses   Final diagnoses:  UGIB (upper  gastrointestinal bleed)  Blood loss anemia    ED Discharge Orders    None       Duffy Bruce, MD 12/31/17 2352    Duffy Bruce, MD 01/01/18 0030

## 2018-01-01 DIAGNOSIS — D649 Anemia, unspecified: Secondary | ICD-10-CM | POA: Diagnosis not present

## 2018-01-01 DIAGNOSIS — N183 Chronic kidney disease, stage 3 (moderate): Secondary | ICD-10-CM | POA: Diagnosis present

## 2018-01-01 DIAGNOSIS — K2970 Gastritis, unspecified, without bleeding: Secondary | ICD-10-CM | POA: Diagnosis not present

## 2018-01-01 DIAGNOSIS — R402254 Coma scale, best verbal response, oriented, 24 hours or more after hospital admission: Secondary | ICD-10-CM | POA: Diagnosis not present

## 2018-01-01 DIAGNOSIS — K635 Polyp of colon: Secondary | ICD-10-CM | POA: Diagnosis present

## 2018-01-01 DIAGNOSIS — D122 Benign neoplasm of ascending colon: Secondary | ICD-10-CM | POA: Diagnosis not present

## 2018-01-01 DIAGNOSIS — R634 Abnormal weight loss: Secondary | ICD-10-CM | POA: Diagnosis present

## 2018-01-01 DIAGNOSIS — T39395A Adverse effect of other nonsteroidal anti-inflammatory drugs [NSAID], initial encounter: Secondary | ICD-10-CM | POA: Diagnosis present

## 2018-01-01 DIAGNOSIS — D62 Acute posthemorrhagic anemia: Secondary | ICD-10-CM

## 2018-01-01 DIAGNOSIS — D500 Iron deficiency anemia secondary to blood loss (chronic): Secondary | ICD-10-CM | POA: Diagnosis present

## 2018-01-01 DIAGNOSIS — K922 Gastrointestinal hemorrhage, unspecified: Secondary | ICD-10-CM | POA: Diagnosis not present

## 2018-01-01 DIAGNOSIS — K648 Other hemorrhoids: Secondary | ICD-10-CM | POA: Diagnosis present

## 2018-01-01 DIAGNOSIS — K264 Chronic or unspecified duodenal ulcer with hemorrhage: Secondary | ICD-10-CM | POA: Diagnosis present

## 2018-01-01 DIAGNOSIS — Z800 Family history of malignant neoplasm of digestive organs: Secondary | ICD-10-CM | POA: Diagnosis not present

## 2018-01-01 DIAGNOSIS — Z7901 Long term (current) use of anticoagulants: Secondary | ICD-10-CM | POA: Diagnosis not present

## 2018-01-01 DIAGNOSIS — K222 Esophageal obstruction: Secondary | ICD-10-CM | POA: Diagnosis present

## 2018-01-01 DIAGNOSIS — Z1211 Encounter for screening for malignant neoplasm of colon: Secondary | ICD-10-CM | POA: Diagnosis not present

## 2018-01-01 DIAGNOSIS — I4891 Unspecified atrial fibrillation: Secondary | ICD-10-CM | POA: Diagnosis not present

## 2018-01-01 DIAGNOSIS — K449 Diaphragmatic hernia without obstruction or gangrene: Secondary | ICD-10-CM | POA: Diagnosis present

## 2018-01-01 DIAGNOSIS — K269 Duodenal ulcer, unspecified as acute or chronic, without hemorrhage or perforation: Secondary | ICD-10-CM | POA: Diagnosis not present

## 2018-01-01 DIAGNOSIS — E785 Hyperlipidemia, unspecified: Secondary | ICD-10-CM | POA: Diagnosis present

## 2018-01-01 DIAGNOSIS — R402144 Coma scale, eyes open, spontaneous, 24 hours or more after hospital admission: Secondary | ICD-10-CM | POA: Diagnosis not present

## 2018-01-01 DIAGNOSIS — N179 Acute kidney failure, unspecified: Secondary | ICD-10-CM | POA: Diagnosis not present

## 2018-01-01 DIAGNOSIS — Z23 Encounter for immunization: Secondary | ICD-10-CM | POA: Diagnosis present

## 2018-01-01 DIAGNOSIS — R402364 Coma scale, best motor response, obeys commands, 24 hours or more after hospital admission: Secondary | ICD-10-CM | POA: Diagnosis not present

## 2018-01-01 DIAGNOSIS — K5730 Diverticulosis of large intestine without perforation or abscess without bleeding: Secondary | ICD-10-CM | POA: Diagnosis present

## 2018-01-01 DIAGNOSIS — F329 Major depressive disorder, single episode, unspecified: Secondary | ICD-10-CM | POA: Diagnosis present

## 2018-01-01 DIAGNOSIS — K254 Chronic or unspecified gastric ulcer with hemorrhage: Secondary | ICD-10-CM | POA: Diagnosis present

## 2018-01-01 DIAGNOSIS — I129 Hypertensive chronic kidney disease with stage 1 through stage 4 chronic kidney disease, or unspecified chronic kidney disease: Secondary | ICD-10-CM | POA: Diagnosis present

## 2018-01-01 DIAGNOSIS — F909 Attention-deficit hyperactivity disorder, unspecified type: Secondary | ICD-10-CM | POA: Diagnosis present

## 2018-01-01 DIAGNOSIS — K259 Gastric ulcer, unspecified as acute or chronic, without hemorrhage or perforation: Secondary | ICD-10-CM | POA: Diagnosis not present

## 2018-01-01 DIAGNOSIS — M549 Dorsalgia, unspecified: Secondary | ICD-10-CM | POA: Diagnosis present

## 2018-01-01 DIAGNOSIS — I4819 Other persistent atrial fibrillation: Secondary | ICD-10-CM | POA: Diagnosis present

## 2018-01-01 DIAGNOSIS — G8929 Other chronic pain: Secondary | ICD-10-CM | POA: Diagnosis present

## 2018-01-01 LAB — CBC
HCT: 21.2 % — ABNORMAL LOW (ref 36.0–46.0)
HEMOGLOBIN: 6.9 g/dL — AB (ref 12.0–15.0)
MCH: 29.9 pg (ref 26.0–34.0)
MCHC: 32.5 g/dL (ref 30.0–36.0)
MCV: 91.8 fL (ref 78.0–100.0)
Platelets: 217 10*3/uL (ref 150–400)
RBC: 2.31 MIL/uL — ABNORMAL LOW (ref 3.87–5.11)
RDW: 14.1 % (ref 11.5–15.5)
WBC: 11.7 10*3/uL — ABNORMAL HIGH (ref 4.0–10.5)

## 2018-01-01 LAB — BASIC METABOLIC PANEL
Anion gap: 6 (ref 5–15)
BUN: 83 mg/dL — AB (ref 8–23)
CHLORIDE: 111 mmol/L (ref 98–111)
CO2: 22 mmol/L (ref 22–32)
CREATININE: 1.65 mg/dL — AB (ref 0.44–1.00)
Calcium: 8.3 mg/dL — ABNORMAL LOW (ref 8.9–10.3)
GFR calc Af Amer: 35 mL/min — ABNORMAL LOW (ref >60)
GFR calc non Af Amer: 30 mL/min — ABNORMAL LOW (ref >60)
Glucose, Bld: 107 mg/dL — ABNORMAL HIGH (ref 70–99)
Potassium: 3.8 mmol/L (ref 3.5–5.1)
SODIUM: 139 mmol/L (ref 135–145)

## 2018-01-01 LAB — HEMOGLOBIN AND HEMATOCRIT, BLOOD
HEMATOCRIT: 27.9 % — AB (ref 36.0–46.0)
HEMOGLOBIN: 9.3 g/dL — AB (ref 12.0–15.0)

## 2018-01-01 LAB — PREPARE RBC (CROSSMATCH)

## 2018-01-01 LAB — HIV ANTIBODY (ROUTINE TESTING W REFLEX): HIV SCREEN 4TH GENERATION: NONREACTIVE

## 2018-01-01 MED ORDER — PEG-KCL-NACL-NASULF-NA ASC-C 100 G PO SOLR
0.5000 | Freq: Once | ORAL | Status: AC
  Administered 2018-01-02: 100 g via ORAL
  Filled 2018-01-01: qty 1

## 2018-01-01 MED ORDER — PEG-KCL-NACL-NASULF-NA ASC-C 100 G PO SOLR
0.5000 | Freq: Once | ORAL | Status: AC
  Administered 2018-01-01: 100 g via ORAL
  Filled 2018-01-01: qty 1

## 2018-01-01 MED ORDER — DOFETILIDE 500 MCG PO CAPS
500.0000 ug | ORAL_CAPSULE | Freq: Two times a day (BID) | ORAL | Status: DC
  Administered 2018-01-01 – 2018-01-03 (×5): 500 ug via ORAL
  Filled 2018-01-01 (×5): qty 1

## 2018-01-01 MED ORDER — METOCLOPRAMIDE HCL 5 MG/ML IJ SOLN: 10.0000 mg | Freq: Once | INTRAMUSCULAR | Status: DC

## 2018-01-01 MED ORDER — INFLUENZA VAC SPLIT HIGH-DOSE 0.5 ML IM SUSY
0.5000 mL | PREFILLED_SYRINGE | INTRAMUSCULAR | Status: AC
  Administered 2018-01-02: 0.5 mL via INTRAMUSCULAR
  Filled 2018-01-01: qty 0.5

## 2018-01-01 MED ORDER — SODIUM CHLORIDE 0.9% IV SOLUTION
Freq: Once | INTRAVENOUS | Status: AC
  Administered 2018-01-01: 12:00:00 via INTRAVENOUS

## 2018-01-01 MED ORDER — PEG 3350-KCL-NABCB-NACL-NASULF 236 G PO SOLR
4000.0000 mL | Freq: Once | ORAL | Status: DC
  Filled 2018-01-01: qty 4000

## 2018-01-01 MED ORDER — ACETAMINOPHEN 650 MG RE SUPP: 650.0000 mg | Freq: Four times a day (QID) | RECTAL | Status: DC | PRN

## 2018-01-01 MED ORDER — ESCITALOPRAM OXALATE 10 MG PO TABS
20.0000 mg | ORAL_TABLET | Freq: Every day | ORAL | Status: DC
  Administered 2018-01-01 – 2018-01-03 (×3): 20 mg via ORAL
  Filled 2018-01-01 (×3): qty 2

## 2018-01-01 MED ORDER — ATORVASTATIN CALCIUM 10 MG PO TABS
10.0000 mg | ORAL_TABLET | Freq: Every day | ORAL | Status: DC
  Administered 2018-01-01 – 2018-01-02 (×2): 10 mg via ORAL
  Filled 2018-01-01 (×2): qty 1

## 2018-01-01 MED ORDER — ONDANSETRON HCL 4 MG/2ML IJ SOLN: 4.0000 mg | Freq: Four times a day (QID) | INTRAMUSCULAR | Status: DC | PRN

## 2018-01-01 MED ORDER — METOCLOPRAMIDE HCL 5 MG/ML IJ SOLN
10.0000 mg | Freq: Once | INTRAMUSCULAR | Status: AC
  Administered 2018-01-01: 10 mg via INTRAVENOUS
  Filled 2018-01-01: qty 2

## 2018-01-01 MED ORDER — BISACODYL 5 MG PO TBEC
20.0000 mg | DELAYED_RELEASE_TABLET | Freq: Once | ORAL | Status: AC
  Administered 2018-01-01: 20 mg via ORAL
  Filled 2018-01-01: qty 4

## 2018-01-01 MED ORDER — ROPINIROLE HCL 1 MG PO TABS
1.0000 mg | ORAL_TABLET | Freq: Every day | ORAL | Status: DC
  Administered 2018-01-01 – 2018-01-02 (×3): 1 mg via ORAL
  Filled 2018-01-01 (×4): qty 1

## 2018-01-01 MED ORDER — ONDANSETRON HCL 4 MG PO TABS: 4.0000 mg | ORAL_TABLET | Freq: Four times a day (QID) | ORAL | Status: DC | PRN

## 2018-01-01 MED ORDER — SODIUM CHLORIDE 0.9 % IV SOLN
INTRAVENOUS | Status: DC
  Administered 2018-01-01 (×2): via INTRAVENOUS
  Administered 2018-01-02: 100 mL/h via INTRAVENOUS
  Administered 2018-01-02: 07:00:00 via INTRAVENOUS
  Administered 2018-01-03: 50 mL/h via INTRAVENOUS

## 2018-01-01 MED ORDER — PNEUMOCOCCAL VAC POLYVALENT 25 MCG/0.5ML IJ INJ
0.5000 mL | INJECTION | INTRAMUSCULAR | Status: AC
  Administered 2018-01-02: 0.5 mL via INTRAMUSCULAR
  Filled 2018-01-01: qty 0.5

## 2018-01-01 MED ORDER — ACETAMINOPHEN 325 MG PO TABS: 650.0000 mg | ORAL_TABLET | Freq: Four times a day (QID) | ORAL | Status: DC | PRN

## 2018-01-01 MED ORDER — METOPROLOL TARTRATE 50 MG PO TABS
50.0000 mg | ORAL_TABLET | Freq: Two times a day (BID) | ORAL | Status: DC
  Administered 2018-01-01 – 2018-01-03 (×5): 50 mg via ORAL
  Filled 2018-01-01: qty 2
  Filled 2018-01-01: qty 1
  Filled 2018-01-01 (×2): qty 2
  Filled 2018-01-01 (×2): qty 1

## 2018-01-01 MED ORDER — DOFETILIDE 500 MCG PO CAPS: 500.0000 ug | ORAL_CAPSULE | Freq: Two times a day (BID) | ORAL | Status: DC

## 2018-01-01 MED ORDER — ZOLPIDEM TARTRATE 5 MG PO TABS: 5.0000 mg | ORAL_TABLET | Freq: Every evening | ORAL | Status: DC | PRN

## 2018-01-01 MED ORDER — PEG-KCL-NACL-NASULF-NA ASC-C 100 G PO SOLR: 1.0000 | Freq: Once | ORAL | Status: DC

## 2018-01-01 NOTE — Consult Note (Signed)
   [facility] Inpatient Consult   01/01/2018  CATHIE BONNELL [birth year] [id number]   Patient evaluated for community based chronic disease management services with [facility] as a benefit of patient's [employer]. Spoke with patient at bedside to explain [facility] services. Patient states she has not chosen a primary care provider.  She sees her cardiologist regularly.  She drives herself.  She states she would not mind having post hospital follow up.  She will receive a transition of care follow up call from [facility].   Patient will receive post hospital discharge call and will be evaluated for monthly home visits for assessments and disease process education.Patient denies any current issues with her medications.  Consent for signed.   Left contact information and [facility] literature at bedside. Made Inpatient Case Manager aware that [facility] following and that the patient needs a primary care provider. Will concinue to follow up for progress and disposition needs.  Of note, [facility] Management services does not replace or interfere with any services that are arranged by inpatient case management or social work.  For additional questions or referrals please contact:    Natividad Brood, RN BSN [facility] Liaison  [phone] business mobile phone Toll free office [phone]

## 2018-01-01 NOTE — Consult Note (Addendum)
[place] [facility] Consult: 9:17 AM 01/01/2018  LOS: 0 days    Referring Provider: Dr [place] Crane  Primary Care Physician: Does not have a PCP.  Not inquired into acquiring a PCP. Primary Gastroenterologist:  unassigned    Reason for Consultation:  GIB   HPI: Rose Ball is a 72 y.o. female.  PMH A fib. S/p multiple cardioversions.  On Pradaxa.  S/p loop recorder x 2.   ADD.  Obesity.  No prior colonoscopy or EGD.  Doing well until Monday, 2 d ago.  Started feeling dizzy, weak, tachycardic.   Vomited CG material that PM.  Weakness, dizziness persisted through Tuesday and she came to the emergency department.  Undergoing x-ray she vomited coffee like material on several occasions but has not vomited since.  She had bowel movements Monday and Tuesday and they were brown, normal, formed. Hgb 6.9.  MCV 91.  Hgb 13.8 02/2015.    S/p PRBC x 2 U.    Lost ~ 60 # starting 04/2017 with change in diet designed to help lower blood sugar.  Current BMI is 24.  She takes 2 Aleve per day occasionally.  Probably has used Aleve 5 days in the last month to address back pain.  Generally good appetite.  No dysphagia.  No abdominal pain.  No heartburn, nausea other than the last couple of days with nausea.  Good exercise tolerance.  She gardens a lot.  She is normally able to climb the flight of stairs at her home from the first to the second floor.  No alcohol.  Tends to bruise easily but no issues with excessive bleeding.  No syncope.  Mother was diagnosed with colon cancer around age 50 and died within a year of diagnosis.      Past Medical History:  Diagnosis Date  . Attention deficit    on Vyvanse  . Chronic anticoagulation   . Depression   . Dyslipidemia   . Hypertension   . Hypokalemia   . Morbid obesity ([place])   .  Palpitations   . Persistent atrial fibrillation 05/27/14 Chadsvasc score of 4   s/p cardioversion Sept 2012; failed    Past Surgical History:  Procedure Laterality Date  . BREAST BIOPSY    . CARDIOVERSION  Sept 2012   failed  . CARDIOVERSION N/A 09/08/2012   Procedure: CARDIOVERSION;  Surgeon: Fay Records, MD;  Location: [facility] ENDOSCOPY;  Service: Cardiovascular;  Laterality: N/A;  . CARDIOVERSION N/A 02/15/2013   Procedure: CARDIOVERSION-BEDSIDE;  Surgeon: Darlin Coco, MD;  Location: [place];  Service: Cardiovascular;  Laterality: N/A;  . CARDIOVERSION N/A 02/05/2011   Procedure: CARDIOVERSION;  Surgeon: Evans Lance, MD;  Location: [facility] CATH LAB;  Service: Cardiovascular;  Laterality: N/A;  . Convergent procedure  11/13/11   afib ablation at [facility]  . EP IMPLANTABLE DEVICE N/A 02/09/2015   Procedure: Loop Recorder Removal;  Surgeon: [name], MD;  Location: [place] CV LAB;  Service: Cardiovascular;  Laterality: N/A;  . loop recorder insertion Left 09/2011  . Loop recorder removal  02/09/15  MDT Reveal XT loop recorder removed by Dr Rayann Heman  . TEE WITHOUT CARDIOVERSION N/A 09/08/2012   Procedure: TRANSESOPHAGEAL ECHOCARDIOGRAM (TEE);  Surgeon: Fay Records, MD;  Location: [facility] ENDOSCOPY;  Service: Cardiovascular;  Laterality: N/A;  . TONSILLECTOMY    . TONSILLECTOMY    . US ECHOCARDIOGRAPHY  Aug 3151   Normal systolic function with EF 55% and slight LAE at 4.4cm    Prior to Admission medications   Medication Sig Start Date End Date Taking? Authorizing Provider  amphetamine-dextroamphetamine (ADDERALL) 15 MG tablet Take 15 mg by mouth daily as needed (for additional focus).  04/02/11  Yes [provider]  atorvastatin (LIPITOR) 10 MG tablet TAKE 1 TABLET BY MOUTH EVERY DAY Patient taking differently: Take 10 mg by mouth daily at 6 PM.  06/06/17  Yes Sherran Needs, NP  dabigatran (PRADAXA) 150 MG CAPS capsule TAKE 1 CAPSULE (150 MG TOTAL) BY MOUTH 2 (TWO) TIMES DAILY. Patient  taking differently: Take 150 mg by mouth 2 (two) times daily.  12/17/17  Yes Sherran Needs, NP  dofetilide (TIKOSYN) 500 MCG capsule Take 1 capsule (500 mcg total) by mouth 2 (two) times daily. 12/17/17  Yes Sherran Needs, NP  escitalopram (LEXAPRO) 20 MG tablet Take 20 mg by mouth Daily. Lexapro 07/07/10  Yes [provider]  KLOR-CON M10 10 MEQ tablet TAKE 1 TABLET (10 MEQ TOTAL) BY MOUTH DAILY. NO REFILLS UNTIL APPT [phone] Patient taking differently: Take 10 mEq by mouth every morning.  12/11/17  Yes Sherran Needs, NP  metoprolol tartrate (LOPRESSOR) 50 MG tablet TAKE 1 TABLET BY MOUTH TWICE A DAY Patient taking differently: Take 50 mg by mouth 2 (two) times daily.  06/03/17  Yes Sherran Needs, NP  OVER THE COUNTER MEDICATION Take 400 mg by mouth 2 (two) times daily. Magnical/D Take 1 tablet by mouth twice a day   Yes [provider]  rOPINIRole (REQUIP) 1 MG tablet Take 1 mg by mouth at bedtime.     Yes [provider]  VYVANSE 70 MG capsule Take 70 mg by mouth Daily.  07/11/10  Yes [provider]    Scheduled Meds: . sodium chloride   Intravenous Once  . atorvastatin  10 mg Oral q1800  . dofetilide  500 mcg Oral BID  . escitalopram  20 mg Oral Daily  . [START ON 01/02/2018] Influenza vac split quadrivalent PF  0.5 mL Intramuscular Tomorrow-1000  . metoprolol tartrate  50 mg Oral BID  . [START ON 01/02/2018] pneumococcal 23 valent vaccine  0.5 mL Intramuscular Tomorrow-1000  . rOPINIRole  1 mg Oral QHS   Infusions: . sodium chloride 100 mL/hr at 01/01/18 0128  . pantoprozole (PROTONIX) infusion 8 mg/hr (12/31/17 2354)   PRN Meds: acetaminophen **OR** acetaminophen, ondansetron **OR** ondansetron (ZOFRAN) IV, zolpidem   Allergies as of 12/31/2017 - Review Complete 12/31/2017  Allergen Reaction Noted  . Amiodarone Other (See Comments) 08/30/2010    Family History  Problem Relation Age of Onset  . Coronary artery disease Father      Social History   Socioeconomic History  . Marital status: Married    Spouse name: Not on file  . Number of children: Not on file  . Years of education: Not on file  . Highest education level: Not on file  Occupational History  . Not on file  Social Needs  . Financial resource strain: Not on file  . Food insecurity:    Worry: Not on file  Inability: Not on file  . Transportation needs:    Medical: Not on file    Non-medical: Not on file  Tobacco Use  . Smoking status: Never Smoker  . Smokeless tobacco: Never Used  Substance and Sexual Activity  . Alcohol use: No  . Drug use: No  . Sexual activity: Not on file  Lifestyle  . Physical activity:    Days per week: Not on file    Minutes per session: Not on file  . Stress: Not on file  Relationships  . Social connections:    Talks on phone: Not on file    Gets together: Not on file    Attends religious service: Not on file    Active member of club or organization: Not on file    Attends meetings of clubs or organizations: Not on file    Relationship status: Not on file  . Intimate partner violence:    Fear of current or ex partner: Not on file    Emotionally abused: Not on file    Physically abused: Not on file    Forced sexual activity: Not on file  Other Topics Concern  . Not on file  Social History Narrative   Pt lives in [place] alone.  Works at [employer]: Constitutional:  Per HPI ENT:  No nose bleeds Pulm: Recent shortness of breath but normally this is not much of an issue. CV:  No chest pain, no LE edema.  Tachycardia in the last couple of days. GU:  No hematuria, no frequency GI:  Per HPI Heme: No previous diagnosis of anemia. Transfusions: None before last night. Neuro: Dizziness.  No syncope. Derm:  No itching, no rash or sores.  Endocrine:  No sweats or chills.  No polyuria or dysuria Immunization: Did not inquire as to recent  vaccinations. Travel:  None beyond local counties in last few months.    PHYSICAL EXAM: Vital signs in last 24 hours: Vitals:   01/01/18 0214 01/01/18 0410  BP: (!) 149/72 (!) 145/74  Pulse: 85 79  Resp: 19 20  Temp: 99 F (37.2 C) 99.3 F (37.4 C)  SpO2: 100% 100%   Wt Readings from Last 3 Encounters:  01/01/18 75.2 kg  12/17/17 81.2 kg  03/14/17 109.8 kg    General: Pleasant, alert, comfortable WF.  Overall looks well. Head: No facial asymmetry or swelling.  No signs of head trauma. Eyes: No scleral icterus.  No conjunctival pallor.  EOMI. Ears: Not hard of hearing Nose: No congestion or discharge. Mouth: Oropharynx moist, clear, pink.  Tongue midline. Neck: No JVD, no masses, no thyromegaly. Lungs: Clear bilaterally.  No cough or labored breathing. Heart: RRR.  No MRG.  S1, S2 present Abdomen: Soft.  Not tender or distended.  No HSM, masses, bruits, hernias.   active bowel sounds..   Rectal: Deferred Musc/Skeltl: Arthritic changes in her fingers. Extremities: No CCE. Neurologic: Alert.  Oriented x3.  Somewhat slow response time to questions but answers are appropriate and accurate.  Moves all 4 limbs, no tremors.  No weakness. Skin: No significant purpura.  No telangiectasia.  No suspicious lesions, rashes or sores. Tattoos: None observed. Nodes: No cervical adenopathy. Psych: Pleasant, cooperative.  Intake/Output from previous day: 10/01 0701 - 10/02 0700 In: 10 [I.V.:10] Out: -  Intake/Output this shift: No intake/output data recorded.  LAB RESULTS: Recent Labs    12/31/17 1838 01/01/18 0551  WBC 13.6* 11.7*  HGB 7.8*  6.9*  HCT 24.8* 21.2*  PLT 326 217   BMET Lab Results  Component Value Date   NA 139 01/01/2018   NA 139 12/31/2017   NA 141 12/17/2017   K 3.8 01/01/2018   K 4.2 12/31/2017   K 4.7 12/17/2017   CL 111 01/01/2018   CL 110 12/31/2017   CL 107 12/17/2017   CO2 22 01/01/2018   CO2 19 (L) 12/31/2017   CO2 26 12/17/2017   GLUCOSE  107 (H) 01/01/2018   GLUCOSE 163 (H) 12/31/2017   GLUCOSE 90 12/17/2017   BUN 83 (H) 01/01/2018   BUN 86 (H) 12/31/2017   BUN 30 (H) 12/17/2017   CREATININE 1.65 (H) 01/01/2018   CREATININE 1.76 (H) 12/31/2017   CREATININE 1.56 (H) 12/17/2017   CALCIUM 8.3 (L) 01/01/2018   CALCIUM 8.9 12/31/2017   CALCIUM 8.9 12/17/2017   LFT Recent Labs    12/31/17 2230  PROT 5.9*  ALBUMIN 3.4*  AST 18  ALT 17  ALKPHOS 52  BILITOT 0.3  BILIDIR <0.1  IBILI NOT CALCULATED   PT/INR Lab Results  Component Value Date   INR 1.59 12/31/2017   INR 1.16 02/15/2013   INR 1.8 09/03/2012   Hepatitis Panel No results for input(s): HEPBSAG, HCVAB, HEPAIGM, HEPBIGM in the last 72 hours. C-Diff No components found for: CDIFF Lipase     Component Value Date/Time   LIPASE 36 12/31/2017 2230    Drugs of Abuse  No results found for: LABOPIA, COCAINSCRNUR, LABBENZ, AMPHETMU, THCU, LABBARB   RADIOLOGY STUDIES: Dg Chest 2 View  Result Date: 12/31/2017 CLINICAL DATA:  Short of breath EXAM: CHEST - 2 VIEW COMPARISON:  12/18/2010 FINDINGS: Small focus of atelectasis or scar at the left base. No consolidation or effusion. Normal heart size. No pneumothorax. In oval nodular opacity projects slightly inferior as compared to 2012 study, this was thought to reflect breast calcification. IMPRESSION: No active cardiopulmonary disease. Electronically Signed   By: Donavan Foil M.D.   On: 12/31/2017 19:28     IMPRESSION:   *   CG and coffee-like emesis, GOB +.    *    Symptomatic anemia.  MCV normal.  S/p 2 U PRBCs.    *   Fm hx colon cancer in mother dx ~ age 80  *   Hx A fib.  On chronic Pradaxa and Tikosyn.  Has been maintaining sinus rhythm for several years per her report.  She is in sinus rhythm on 2 EKGs in the last 24 hours.    PLAN:     *  Colonoscopy and EGD tmrw at 0800.  Pt feels capable of tolerating bowel prep.    *  Clears.     Azucena Freed  01/01/2018, 9:17 AM Phone [phone]  [phone]      Attending physician's note   I have taken a history, examined the patient and reviewed the chart. I agree with the Advanced Practitioner's note, impression and recommendations.  32 yr F admitted with symptomatic anemia, and coffee ground emesis, on chronic anticoagulation with Pradax (last dose yesterday morning), family history of colon cancer in mother. Intentional weight loss >60-70 lbs in past 6 months.  Will plan for EGD and colonoscopy for evaluation for severe anemia Monitor Hgb and transfuse to >7 Continue PPI NPO after midnight K. Denzil Magnuson , MD [phone]

## 2018-01-01 NOTE — H&P (Signed)
History and Physical    Rose Ball MGQ:[id number] DOB: 1945-09-16 DOA: 12/31/2017  PCP: Patient, No Pcp Per  Patient coming from: Home  I have personally briefly reviewed patient's old medical records in [place]  Chief Complaint: Hematemesis  HPI: Rose Ball is a 72 y.o. female with medical history significant of ADHD, A.Fib on Pradaxa, chronic back pain taking Naproxen.  Patient developed progressively worsening generalized weakness, DOE over the past 2 days.  Palpitations throughout the day.  Lightheadedness.  2 episodes of coffee ground emesis.  No overt blood in stool.   ED Course: HGB 7.8 down from old baseline of 14 but this last checked in 2016.  Trop 1.7 up from 1.5 last month.  BUN 86.   Review of Systems: As per HPI otherwise 10 point review of systems negative.   Past Medical History:  Diagnosis Date  . Attention deficit    on Vyvanse  . Chronic anticoagulation   . Depression   . Dyslipidemia   . Hypertension   . Hypokalemia   . Morbid obesity ([place])   . Palpitations   . Persistent atrial fibrillation 05/27/14 Chadsvasc score of 4   s/p cardioversion Sept 2012; failed    Past Surgical History:  Procedure Laterality Date  . BREAST BIOPSY    . CARDIOVERSION  Sept 2012   failed  . CARDIOVERSION N/A 09/08/2012   Procedure: CARDIOVERSION;  Surgeon: Fay Records, MD;  Location: [facility] ENDOSCOPY;  Service: Cardiovascular;  Laterality: N/A;  . CARDIOVERSION N/A 02/15/2013   Procedure: CARDIOVERSION-BEDSIDE;  Surgeon: Darlin Coco, MD;  Location: [place];  Service: Cardiovascular;  Laterality: N/A;  . CARDIOVERSION N/A 02/05/2011   Procedure: CARDIOVERSION;  Surgeon: Evans Lance, MD;  Location: [facility] CATH LAB;  Service: Cardiovascular;  Laterality: N/A;  . Convergent procedure  11/13/11   afib ablation at [facility]  . EP IMPLANTABLE DEVICE N/A 02/09/2015   Procedure: Loop Recorder Removal;  Surgeon: [name], MD;  Location: [place] CV LAB;   Service: Cardiovascular;  Laterality: N/A;  . loop recorder insertion Left 09/2011  . Loop recorder removal  02/09/15   MDT Reveal XT loop recorder removed by Dr Rayann Heman  . TEE WITHOUT CARDIOVERSION N/A 09/08/2012   Procedure: TRANSESOPHAGEAL ECHOCARDIOGRAM (TEE);  Surgeon: Fay Records, MD;  Location: [facility] ENDOSCOPY;  Service: Cardiovascular;  Laterality: N/A;  . TONSILLECTOMY    . TONSILLECTOMY    . US ECHOCARDIOGRAPHY  Aug 2671   Normal systolic function with EF 55% and slight LAE at 4.4cm     reports that she has never smoked. She has never used smokeless tobacco. She reports that she does not drink alcohol or use drugs.  Allergies  Allergen Reactions  . Amiodarone Other (See Comments)    REACTION: dyspnea    Family History  Problem Relation Age of Onset  . Coronary artery disease Father      Prior to Admission medications   Medication Sig Start Date End Date Taking? Authorizing Provider  amphetamine-dextroamphetamine (ADDERALL) 15 MG tablet Take 15 mg by mouth daily as needed (for additional focus).  04/02/11  Yes [provider]  atorvastatin (LIPITOR) 10 MG tablet TAKE 1 TABLET BY MOUTH EVERY DAY Patient taking differently: Take 10 mg by mouth daily at 6 PM.  06/06/17  Yes Sherran Needs, NP  dabigatran (PRADAXA) 150 MG CAPS capsule TAKE 1 CAPSULE (150 MG TOTAL) BY MOUTH 2 (TWO) TIMES DAILY. Patient taking differently: Take 150 mg by mouth  2 (two) times daily.  12/17/17  Yes Sherran Needs, NP  dofetilide (TIKOSYN) 500 MCG capsule Take 1 capsule (500 mcg total) by mouth 2 (two) times daily. 12/17/17  Yes Sherran Needs, NP  escitalopram (LEXAPRO) 20 MG tablet Take 20 mg by mouth Daily. Lexapro 07/07/10  Yes [provider]  KLOR-CON M10 10 MEQ tablet TAKE 1 TABLET (10 MEQ TOTAL) BY MOUTH DAILY. NO REFILLS UNTIL APPT [phone] Patient taking differently: Take 10 mEq by mouth every morning.  12/11/17  Yes Sherran Needs, NP  metoprolol tartrate (LOPRESSOR) 50  MG tablet TAKE 1 TABLET BY MOUTH TWICE A DAY Patient taking differently: Take 50 mg by mouth 2 (two) times daily.  06/03/17  Yes Sherran Needs, NP  OVER THE COUNTER MEDICATION Take 400 mg by mouth 2 (two) times daily. Magnical/D Take 1 tablet by mouth twice a day   Yes [provider]  rOPINIRole (REQUIP) 1 MG tablet Take 1 mg by mouth at bedtime.     Yes [provider]  VYVANSE 70 MG capsule Take 70 mg by mouth Daily.  07/11/10  Yes [provider]    Physical Exam: Vitals:   12/31/17 2300 12/31/17 2315 01/01/18 0129 01/01/18 0131  BP: (!) 132/47 (!) 153/78  (!) 135/51  Pulse:  96 85   Resp: 14 20 14    Temp:   98.6 F (37 C)   TempSrc:   Oral   SpO2:  100% 100%   Weight:      Height:        Constitutional: NAD, calm, comfortable Eyes: PERRL, lids and conjunctivae normal ENMT: Mucous membranes are moist. Posterior pharynx clear of any exudate or lesions.Normal dentition.  Neck: normal, supple, no masses, no thyromegaly Respiratory: clear to auscultation bilaterally, no wheezing, no crackles. Normal respiratory effort. No accessory muscle use.  Cardiovascular: Regular rate and rhythm, no murmurs / rubs / gallops. No extremity edema. 2+ pedal pulses. No carotid bruits.  Abdomen: no tenderness, no masses palpated. No hepatosplenomegaly. Bowel sounds positive.  Musculoskeletal: no clubbing / cyanosis. No joint deformity upper and lower extremities. Good ROM, no contractures. Normal muscle tone.  Skin: no rashes, lesions, ulcers. No induration Neurologic: CN 2-12 grossly intact. Sensation intact, DTR normal. Strength 5/5 in all 4.  Psychiatric: Normal judgment and insight. Alert and oriented x 3. Normal mood.    Labs on Admission: I have personally reviewed following labs and imaging studies  CBC: Recent Labs  Lab 12/31/17 1838  WBC 13.6*  HGB 7.8*  HCT 24.8*  MCV 92.2  PLT 256   Basic Metabolic Panel: Recent Labs  Lab 12/31/17 1838  NA 139  K  4.2  CL 110  CO2 19*  GLUCOSE 163*  BUN 86*  CREATININE 1.76*  CALCIUM 8.9   GFR: Estimated Creatinine Clearance: 33.9 mL/min (A) (by C-G formula based on SCr of 1.76 mg/dL (H)). Liver Function Tests: Recent Labs  Lab 12/31/17 2230  AST 18  ALT 17  ALKPHOS 52  BILITOT 0.3  PROT 5.9*  ALBUMIN 3.4*   Recent Labs  Lab 12/31/17 2230  LIPASE 36   No results for input(s): AMMONIA in the last 168 hours. Coagulation Profile: Recent Labs  Lab 12/31/17 2230  INR 1.59   Cardiac Enzymes: No results for input(s): CKTOTAL, CKMB, CKMBINDEX, TROPONINI in the last 168 hours. BNP (last 3 results) No results for input(s): PROBNP in the last 8760 hours. HbA1C: No results for input(s): HGBA1C in the  last 72 hours. CBG: No results for input(s): GLUCAP in the last 168 hours. Lipid Profile: No results for input(s): CHOL, HDL, LDLCALC, TRIG, CHOLHDL, LDLDIRECT in the last 72 hours. Thyroid Function Tests: No results for input(s): TSH, T4TOTAL, FREET4, T3FREE, THYROIDAB in the last 72 hours. Anemia Panel: No results for input(s): VITAMINB12, FOLATE, FERRITIN, TIBC, IRON, RETICCTPCT in the last 72 hours. Urine analysis: No results found for: COLORURINE, APPEARANCEUR, LABSPEC, [place], GLUCOSEU, [place], BILIRUBINUR, KETONESUR, PROTEINUR, UROBILINOGEN, NITRITE, LEUKOCYTESUR  Radiological Exams on Admission: Dg Chest 2 View  Result Date: 12/31/2017 CLINICAL DATA:  Short of breath EXAM: CHEST - 2 VIEW COMPARISON:  12/18/2010 FINDINGS: Small focus of atelectasis or scar at the left base. No consolidation or effusion. Normal heart size. No pneumothorax. In oval nodular opacity projects slightly inferior as compared to 2012 study, this was thought to reflect breast calcification. IMPRESSION: No active cardiopulmonary disease. Electronically Signed   By: Donavan Foil M.D.   On: 12/31/2017 19:28    EKG: Independently reviewed.  Assessment/Plan Principal Problem:   UGIB (upper gastrointestinal  bleed) Active Problems:   Persistent atrial fibrillation   Chronic anticoagulation, on Pradaxa   Acute blood loss anemia    1. UGIB with acute blood loss anemia - 1. Transfuse 1u PRBC 2. Repeat CBC / BMP in AM 3. GI consult in AM 4. Anticipate EGD 5. PPI GTT 6. IVF: NS at 100 2. PAF - 1. Holding pradaxa 2. Continue metoprolol 3. Continue Tikosyn  DVT prophylaxis: SCDs Code Status: Full Family Communication: No family in room Disposition Plan: Home after admit Consults called: none, call GI in AM Admission status: Admit to inpatient - IP status for UGIB with acute blood loss anemia requiring transfusion, upper endoscopy.   Etta Quill DO [facility] Pager [phone] Only works nights!  If 7AM-7PM, please contact the primary day team physician taking care of patient  [email or web address] Password [email or web address]  01/01/2018, 1:36 AM

## 2018-01-01 NOTE — Progress Notes (Signed)
Rose Ball is a 72 y.o. female with medical history significant of ADHD, A.Fib on Pradaxa, chronic back pain taking Naproxen.  Patient developed progressively worsening generalized weakness, DOE over the past 2 days.  Palpitations throughout the day.  Lightheadedness.  2 episodes of coffee ground emesis.  No overt blood in stool.  01/01/2018: Patient seen and examined at bedside.  Hemoglobin dropped to 6.9.  Denies chest pain or shortness of breath at rest.  2 unit PRBC ordered to be transfused.  GI following.  Colonoscopy and EGD planned tomorrow 01/02/2018 at 8 AM.  Please refer to H&P dictated by Dr. Alcario Drought on 01/01/2018 for further details of the assessment and plan.

## 2018-01-01 NOTE — Progress Notes (Signed)
NURSING PROGRESS NOTE  [facility] [id number] Admission Data: 01/01/2018 3:17 AM Attending Provider: Etta Quill, DO PVX:[id number], No Pcp Per Code Status: Full  Rose Ball is a 72 y.o. female patient admitted from ED:  -No acute distress noted.  -No complaints of shortness of breath.  -No complaints of chest pain.   Cardiac Monitoring: Box # 08 in place. Cardiac monitor yields:normal sinus rhythm.  Blood pressure (!) 149/72, pulse 85, temperature 99 F (37.2 C), temperature source Oral, resp. rate 19, height 5\' 9"  (1.753 m), weight 75.2 kg, SpO2 100 %.   IV Fluids:  IV in place, occlusive dsg intact without redness, IV cath antecubital right, condition patent and no redness; left antecubital IV cath with 1 PRBC infusing. After finish will administer NS @ 172ml/hr none.   Allergies:  Amiodarone  Past Medical History:   has a past medical history of Attention deficit, Chronic anticoagulation, Depression, Dyslipidemia, Hypertension, Hypokalemia, Morbid obesity ([place]), Palpitations, and Persistent atrial fibrillation (05/27/14 Chadsvasc score of 4).  Past Surgical History:   has a past surgical history that includes Breast biopsy; Tonsillectomy; Cardioversion (Sept 2012); US ECHOCARDIOGRAPHY (Aug 2012); Convergent procedure (11/13/11); loop recorder insertion (Left, 09/2011); TEE without cardioversion (N/A, 09/08/2012); Cardioversion (N/A, 09/08/2012); Tonsillectomy; Cardioversion (N/A, 02/15/2013); Cardioversion (N/A, 02/05/2011); Loop recorder removal (02/09/15); and Cardiac catheterization (N/A, 02/09/2015).  Social History:   reports that she has never smoked. She has never used smokeless tobacco. She reports that she does not drink alcohol or use drugs.  Skin: Clean, Dry, Intact with scattered bruises on her bilateral lower extremities.   Patient/Family orientated to room. Information packet given to patient/family. Admission inpatient armband information verified with  patient/family to include name and date of birth and placed on patient arm. Side rails up x 2, fall assessment and education completed with patient/family. Patient/family able to verbalize understanding of risk associated with falls and verbalized understanding to call for assistance before getting out of bed. Call light within reach. Patient/family able to voice and demonstrate understanding of unit orientation instructions.    Will continue to evaluate and treat per MD orders.

## 2018-01-01 NOTE — H&P (View-Only) (Signed)
[facility] Consult: 9:17 AM 01/01/2018  LOS: 0 days    Referring Provider: Dr Nevada Crane  Primary Care Physician: Does not have a PCP.  Not inquired into acquiring a PCP. Primary Gastroenterologist:  unassigned    Reason for Consultation:  GIB   HPI: Rose Ball is a 72 y.o. female.  PMH A fib. S/p multiple cardioversions.  On Pradaxa.  S/p loop recorder x 2.   ADD.  Obesity.  No prior colonoscopy or EGD.  Doing well until Monday, 2 d ago.  Started feeling dizzy, weak, tachycardic.   Vomited CG material that PM.  Weakness, dizziness persisted through Tuesday and she came to the emergency department.  Undergoing x-ray she vomited coffee like material on several occasions but has not vomited since.  She had bowel movements Monday and Tuesday and they were brown, normal, formed. Hgb 6.9.  MCV 91.  Hgb 13.8 02/2015.    S/p PRBC x 2 U.    Lost ~ 60 # starting 04/2017 with change in diet designed to help lower blood sugar.  Current BMI is 24.  She takes 2 Aleve per day occasionally.  Probably has used Aleve 5 days in the last month to address back pain.  Generally good appetite.  No dysphagia.  No abdominal pain.  No heartburn, nausea other than the last couple of days with nausea.  Good exercise tolerance.  She gardens a lot.  She is normally able to climb the flight of stairs at her home from the first to the second floor.  No alcohol.  Tends to bruise easily but no issues with excessive bleeding.  No syncope.  Mother was diagnosed with colon cancer around age 39 and died within a year of diagnosis.      Past Medical History:  Diagnosis Date  . Attention deficit    on Vyvanse  . Chronic anticoagulation   . Depression   . Dyslipidemia   . Hypertension   . Hypokalemia   . Morbid obesity ([place])   .  Palpitations   . Persistent atrial fibrillation 05/27/14 Chadsvasc score of 4   s/p cardioversion Sept 2012; failed    Past Surgical History:  Procedure Laterality Date  . BREAST BIOPSY    . CARDIOVERSION  Sept 2012   failed  . CARDIOVERSION N/A 09/08/2012   Procedure: CARDIOVERSION;  Surgeon: Fay Records, MD;  Location: [facility] ENDOSCOPY;  Service: Cardiovascular;  Laterality: N/A;  . CARDIOVERSION N/A 02/15/2013   Procedure: CARDIOVERSION-BEDSIDE;  Surgeon: Darlin Coco, MD;  Location: [facility];  Service: Cardiovascular;  Laterality: N/A;  . CARDIOVERSION N/A 02/05/2011   Procedure: CARDIOVERSION;  Surgeon: Evans Lance, MD;  Location: [facility] CATH LAB;  Service: Cardiovascular;  Laterality: N/A;  . Convergent procedure  11/13/11   afib ablation at [facility]  . EP IMPLANTABLE DEVICE N/A 02/09/2015   Procedure: Loop Recorder Removal;  Surgeon: [name], MD;  Location: [facility] CV LAB;  Service: Cardiovascular;  Laterality: N/A;  . loop recorder insertion Left 09/2011  . Loop recorder removal  02/09/15  MDT Reveal XT loop recorder removed by Dr Rayann Heman  . TEE WITHOUT CARDIOVERSION N/A 09/08/2012   Procedure: TRANSESOPHAGEAL ECHOCARDIOGRAM (TEE);  Surgeon: Fay Records, MD;  Location: [facility] ENDOSCOPY;  Service: Cardiovascular;  Laterality: N/A;  . TONSILLECTOMY    . TONSILLECTOMY    . US ECHOCARDIOGRAPHY  Aug 9767   Normal systolic function with EF 55% and slight LAE at 4.4cm    Prior to Admission medications   Medication Sig Start Date End Date Taking? Authorizing Provider  amphetamine-dextroamphetamine (ADDERALL) 15 MG tablet Take 15 mg by mouth daily as needed (for additional focus).  04/02/11  Yes [provider]  atorvastatin (LIPITOR) 10 MG tablet TAKE 1 TABLET BY MOUTH EVERY DAY Patient taking differently: Take 10 mg by mouth daily at 6 PM.  06/06/17  Yes Sherran Needs, NP  dabigatran (PRADAXA) 150 MG CAPS capsule TAKE 1 CAPSULE (150 MG TOTAL) BY MOUTH 2 (TWO) TIMES DAILY. Patient  taking differently: Take 150 mg by mouth 2 (two) times daily.  12/17/17  Yes Sherran Needs, NP  dofetilide (TIKOSYN) 500 MCG capsule Take 1 capsule (500 mcg total) by mouth 2 (two) times daily. 12/17/17  Yes Sherran Needs, NP  escitalopram (LEXAPRO) 20 MG tablet Take 20 mg by mouth Daily. Lexapro 07/07/10  Yes [provider]  KLOR-CON M10 10 MEQ tablet TAKE 1 TABLET (10 MEQ TOTAL) BY MOUTH DAILY. NO REFILLS UNTIL APPT [phone] Patient taking differently: Take 10 mEq by mouth every morning.  12/11/17  Yes Sherran Needs, NP  metoprolol tartrate (LOPRESSOR) 50 MG tablet TAKE 1 TABLET BY MOUTH TWICE A DAY Patient taking differently: Take 50 mg by mouth 2 (two) times daily.  06/03/17  Yes Sherran Needs, NP  OVER THE COUNTER MEDICATION Take 400 mg by mouth 2 (two) times daily. Magnical/D Take 1 tablet by mouth twice a day   Yes [provider]  rOPINIRole (REQUIP) 1 MG tablet Take 1 mg by mouth at bedtime.     Yes [provider]  VYVANSE 70 MG capsule Take 70 mg by mouth Daily.  07/11/10  Yes [provider]    Scheduled Meds: . sodium chloride   Intravenous Once  . atorvastatin  10 mg Oral q1800  . dofetilide  500 mcg Oral BID  . escitalopram  20 mg Oral Daily  . [START ON 01/02/2018] Influenza vac split quadrivalent PF  0.5 mL Intramuscular Tomorrow-1000  . metoprolol tartrate  50 mg Oral BID  . [START ON 01/02/2018] pneumococcal 23 valent vaccine  0.5 mL Intramuscular Tomorrow-1000  . rOPINIRole  1 mg Oral QHS   Infusions: . sodium chloride 100 mL/hr at 01/01/18 0128  . pantoprozole (PROTONIX) infusion 8 mg/hr (12/31/17 2354)   PRN Meds: acetaminophen **OR** acetaminophen, ondansetron **OR** ondansetron (ZOFRAN) IV, zolpidem   Allergies as of 12/31/2017 - Review Complete 12/31/2017  Allergen Reaction Noted  . Amiodarone Other (See Comments) 08/30/2010    Family History  Problem Relation Age of Onset  . Coronary artery disease Father      Social History   Socioeconomic History  . Marital status: Married    Spouse name: Not on file  . Number of children: Not on file  . Years of education: Not on file  . Highest education level: Not on file  Occupational History  . Not on file  Social Needs  . Financial resource strain: Not on file  . Food insecurity:    Worry: Not on file  Inability: Not on file  . Transportation needs:    Medical: Not on file    Non-medical: Not on file  Tobacco Use  . Smoking status: Never Smoker  . Smokeless tobacco: Never Used  Substance and Sexual Activity  . Alcohol use: No  . Drug use: No  . Sexual activity: Not on file  Lifestyle  . Physical activity:    Days per week: Not on file    Minutes per session: Not on file  . Stress: Not on file  Relationships  . Social connections:    Talks on phone: Not on file    Gets together: Not on file    Attends religious service: Not on file    Active member of club or organization: Not on file    Attends meetings of clubs or organizations: Not on file    Relationship status: Not on file  . Intimate partner violence:    Fear of current or ex partner: Not on file    Emotionally abused: Not on file    Physically abused: Not on file    Forced sexual activity: Not on file  Other Topics Concern  . Not on file  Social History Narrative   Pt lives in [place] alone.  Works at [employer]: Constitutional:  Per HPI ENT:  No nose bleeds Pulm: Recent shortness of breath but normally this is not much of an issue. CV:  No chest pain, no LE edema.  Tachycardia in the last couple of days. GU:  No hematuria, no frequency GI:  Per HPI Heme: No previous diagnosis of anemia. Transfusions: None before last night. Neuro: Dizziness.  No syncope. Derm:  No itching, no rash or sores.  Endocrine:  No sweats or chills.  No polyuria or dysuria Immunization: Did not inquire as to recent  vaccinations. Travel:  None beyond local counties in last few months.    PHYSICAL EXAM: Vital signs in last 24 hours: Vitals:   01/01/18 0214 01/01/18 0410  BP: (!) 149/72 (!) 145/74  Pulse: 85 79  Resp: 19 20  Temp: 99 F (37.2 C) 99.3 F (37.4 C)  SpO2: 100% 100%   Wt Readings from Last 3 Encounters:  01/01/18 75.2 kg  12/17/17 81.2 kg  03/14/17 109.8 kg    General: Pleasant, alert, comfortable WF.  Overall looks well. Head: No facial asymmetry or swelling.  No signs of head trauma. Eyes: No scleral icterus.  No conjunctival pallor.  EOMI. Ears: Not hard of hearing Nose: No congestion or discharge. Mouth: Oropharynx moist, clear, pink.  Tongue midline. Neck: No JVD, no masses, no thyromegaly. Lungs: Clear bilaterally.  No cough or labored breathing. Heart: RRR.  No MRG.  S1, S2 present Abdomen: Soft.  Not tender or distended.  No HSM, masses, bruits, hernias.   active bowel sounds..   Rectal: Deferred Musc/Skeltl: Arthritic changes in her fingers. Extremities: No CCE. Neurologic: Alert.  Oriented x3.  Somewhat slow response time to questions but answers are appropriate and accurate.  Moves all 4 limbs, no tremors.  No weakness. Skin: No significant purpura.  No telangiectasia.  No suspicious lesions, rashes or sores. Tattoos: None observed. Nodes: No cervical adenopathy. Psych: Pleasant, cooperative.  Intake/Output from previous day: 10/01 0701 - 10/02 0700 In: 10 [I.V.:10] Out: -  Intake/Output this shift: No intake/output data recorded.  LAB RESULTS: Recent Labs    12/31/17 1838 01/01/18 0551  WBC 13.6* 11.7*  HGB 7.8*  6.9*  HCT 24.8* 21.2*  PLT 326 217   BMET Lab Results  Component Value Date   NA 139 01/01/2018   NA 139 12/31/2017   NA 141 12/17/2017   K 3.8 01/01/2018   K 4.2 12/31/2017   K 4.7 12/17/2017   CL 111 01/01/2018   CL 110 12/31/2017   CL 107 12/17/2017   CO2 22 01/01/2018   CO2 19 (L) 12/31/2017   CO2 26 12/17/2017   GLUCOSE  107 (H) 01/01/2018   GLUCOSE 163 (H) 12/31/2017   GLUCOSE 90 12/17/2017   BUN 83 (H) 01/01/2018   BUN 86 (H) 12/31/2017   BUN 30 (H) 12/17/2017   CREATININE 1.65 (H) 01/01/2018   CREATININE 1.76 (H) 12/31/2017   CREATININE 1.56 (H) 12/17/2017   CALCIUM 8.3 (L) 01/01/2018   CALCIUM 8.9 12/31/2017   CALCIUM 8.9 12/17/2017   LFT Recent Labs    12/31/17 2230  PROT 5.9*  ALBUMIN 3.4*  AST 18  ALT 17  ALKPHOS 52  BILITOT 0.3  BILIDIR <0.1  IBILI NOT CALCULATED   PT/INR Lab Results  Component Value Date   INR 1.59 12/31/2017   INR 1.16 02/15/2013   INR 1.8 09/03/2012   Hepatitis Panel No results for input(s): HEPBSAG, HCVAB, HEPAIGM, HEPBIGM in the last 72 hours. C-Diff No components found for: CDIFF Lipase     Component Value Date/Time   LIPASE 36 12/31/2017 2230    Drugs of Abuse  No results found for: LABOPIA, COCAINSCRNUR, LABBENZ, AMPHETMU, THCU, LABBARB   RADIOLOGY STUDIES: Dg Chest 2 View  Result Date: 12/31/2017 CLINICAL DATA:  Short of breath EXAM: CHEST - 2 VIEW COMPARISON:  12/18/2010 FINDINGS: Small focus of atelectasis or scar at the left base. No consolidation or effusion. Normal heart size. No pneumothorax. In oval nodular opacity projects slightly inferior as compared to 2012 study, this was thought to reflect breast calcification. IMPRESSION: No active cardiopulmonary disease. Electronically Signed   By: Donavan Foil M.D.   On: 12/31/2017 19:28     IMPRESSION:   *   CG and coffee-like emesis, GOB +.    *    Symptomatic anemia.  MCV normal.  S/p 2 U PRBCs.    *   Fm hx colon cancer in mother dx ~ age 15  *   Hx A fib.  On chronic Pradaxa and Tikosyn.  Has been maintaining sinus rhythm for several years per her report.  She is in sinus rhythm on 2 EKGs in the last 24 hours.    PLAN:     *  Colonoscopy and EGD tmrw at 0800.  Pt feels capable of tolerating bowel prep.    *  Clears.     Azucena Freed  01/01/2018, 9:17 AM Phone [phone]  [phone]      Attending physician's note   I have taken a history, examined the patient and reviewed the chart. I agree with the Advanced Practitioner's note, impression and recommendations.  66 yr F admitted with symptomatic anemia, and coffee ground emesis, on chronic anticoagulation with Pradax (last dose yesterday morning), family history of colon cancer in mother. Intentional weight loss >60-70 lbs in past 6 months.  Will plan for EGD and colonoscopy for evaluation for severe anemia Monitor Hgb and transfuse to >7 Continue PPI NPO after midnight K. Denzil Magnuson , MD [phone]

## 2018-01-02 ENCOUNTER — Encounter (HOSPITAL_COMMUNITY)
Admission: EM | Disposition: A | Discharge status: Home / Self Care | Payer: Self-pay | Source: Home / Self Care | Attending: Internal Medicine

## 2018-01-02 ENCOUNTER — Encounter (HOSPITAL_COMMUNITY): Payer: Self-pay | Admitting: *Deleted

## 2018-01-02 ENCOUNTER — Inpatient Hospital Stay (HOSPITAL_COMMUNITY): Payer: PPO | Admitting: Certified Registered Nurse Anesthetist

## 2018-01-02 DIAGNOSIS — D500 Iron deficiency anemia secondary to blood loss (chronic): Secondary | ICD-10-CM

## 2018-01-02 DIAGNOSIS — K269 Duodenal ulcer, unspecified as acute or chronic, without hemorrhage or perforation: Secondary | ICD-10-CM

## 2018-01-02 DIAGNOSIS — K2970 Gastritis, unspecified, without bleeding: Secondary | ICD-10-CM

## 2018-01-02 DIAGNOSIS — K222 Esophageal obstruction: Secondary | ICD-10-CM

## 2018-01-02 DIAGNOSIS — I4891 Unspecified atrial fibrillation: Secondary | ICD-10-CM

## 2018-01-02 DIAGNOSIS — K259 Gastric ulcer, unspecified as acute or chronic, without hemorrhage or perforation: Secondary | ICD-10-CM

## 2018-01-02 HISTORY — PX: ESOPHAGOGASTRODUODENOSCOPY (EGD) WITH PROPOFOL: SHX5813

## 2018-01-02 HISTORY — PX: BIOPSY: SHX5522

## 2018-01-02 LAB — BPAM RBC
BLOOD PRODUCT EXPIRATION DATE: 201910262359
BLOOD PRODUCT EXPIRATION DATE: 201911012359
Blood Product Expiration Date: 201911012359
ISSUE DATE / TIME: 201910020123
ISSUE DATE / TIME: 201910021123
ISSUE DATE / TIME: 201910021415
UNIT TYPE AND RH: 5100
Unit Type and Rh: 5100
Unit Type and Rh: 5100

## 2018-01-02 LAB — TYPE AND SCREEN
ABO/RH(D): O POS
ANTIBODY SCREEN: NEGATIVE
UNIT DIVISION: 0
Unit division: 0
Unit division: 0

## 2018-01-02 LAB — CBC
HCT: 30.4 % — ABNORMAL LOW (ref 36.0–46.0)
Hemoglobin: 9.8 g/dL — ABNORMAL LOW (ref 12.0–15.0)
MCH: 30 pg (ref 26.0–34.0)
MCHC: 32.2 g/dL (ref 30.0–36.0)
MCV: 93 fL (ref 78.0–100.0)
PLATELETS: 232 10*3/uL (ref 150–400)
RBC: 3.27 MIL/uL — AB (ref 3.87–5.11)
RDW: 14.6 % (ref 11.5–15.5)
WBC: 7.8 10*3/uL (ref 4.0–10.5)

## 2018-01-02 LAB — BASIC METABOLIC PANEL
ANION GAP: 6 (ref 5–15)
BUN: 46 mg/dL — ABNORMAL HIGH (ref 8–23)
CALCIUM: 8.7 mg/dL — AB (ref 8.9–10.3)
CO2: 22 mmol/L (ref 22–32)
Chloride: 114 mmol/L — ABNORMAL HIGH (ref 98–111)
Creatinine, Ser: 1.43 mg/dL — ABNORMAL HIGH (ref 0.44–1.00)
GFR, EST AFRICAN AMERICAN: 42 mL/min — AB (ref >60)
GFR, EST NON AFRICAN AMERICAN: 36 mL/min — AB (ref >60)
Glucose, Bld: 92 mg/dL (ref 70–99)
Potassium: 4 mmol/L (ref 3.5–5.1)
Sodium: 142 mmol/L (ref 135–145)

## 2018-01-02 LAB — MAGNESIUM: Magnesium: 2 mg/dL (ref 1.7–2.4)

## 2018-01-02 SURGERY — ESOPHAGOGASTRODUODENOSCOPY (EGD) WITH PROPOFOL
Anesthesia: Monitor Anesthesia Care

## 2018-01-02 MED ORDER — PEG-KCL-NACL-NASULF-NA ASC-C 100 G PO SOLR: 0.5000 | Freq: Once | ORAL | Status: DC

## 2018-01-02 MED ORDER — LIDOCAINE 2% (20 MG/ML) 5 ML SYRINGE
INTRAMUSCULAR | Status: DC | PRN
  Administered 2018-01-02: 60 mg via INTRAVENOUS

## 2018-01-02 MED ORDER — PEG-KCL-NACL-NASULF-NA ASC-C 100 G PO SOLR: 1.0000 | Freq: Once | ORAL | Status: DC

## 2018-01-02 MED ORDER — METOCLOPRAMIDE HCL 5 MG/ML IJ SOLN
10.0000 mg | Freq: Once | INTRAMUSCULAR | Status: AC
  Administered 2018-01-02: 10 mg via INTRAVENOUS
  Filled 2018-01-02: qty 2

## 2018-01-02 MED ORDER — PROPOFOL 10 MG/ML IV BOLUS
INTRAVENOUS | Status: DC | PRN
  Administered 2018-01-02 (×2): 20 mg via INTRAVENOUS

## 2018-01-02 MED ORDER — PEG-KCL-NACL-NASULF-NA ASC-C 100 G PO SOLR
0.5000 | Freq: Once | ORAL | Status: AC
  Administered 2018-01-03: 100 g via ORAL
  Filled 2018-01-02 (×2): qty 1

## 2018-01-02 MED ORDER — PROPOFOL 500 MG/50ML IV EMUL
INTRAVENOUS | Status: DC | PRN
  Administered 2018-01-02: 50 ug/kg/min via INTRAVENOUS

## 2018-01-02 MED ORDER — PEG-KCL-NACL-NASULF-NA ASC-C 100 G PO SOLR
0.5000 | Freq: Once | ORAL | Status: AC
  Administered 2018-01-02: 100 g via ORAL
  Filled 2018-01-02: qty 1

## 2018-01-02 SURGICAL SUPPLY — 25 items

## 2018-01-02 NOTE — Interval H&P Note (Signed)
History and Physical Interval Note:  01/02/2018 8:11 AM  Rose Ball  has presented today for surgery, with the diagnosis of anemia, GIB  The various methods of treatment have been discussed with the patient and family. After consideration of risks, benefits and other options for treatment, the patient has consented to  Procedure(s): ESOPHAGOGASTRODUODENOSCOPY (EGD) WITH PROPOFOL (N/A) as a surgical intervention .  The patient's history has been reviewed, patient examined, no change in status, stable for surgery.  I have reviewed the patient's chart and labs.  Questions were answered to the patient's satisfaction.     Kavitha Nandigam

## 2018-01-02 NOTE — Progress Notes (Signed)
Earlier when RN rounded on patient she was sleeping- RN woke patient up to let her know that she needed to continue drinking the prep. RN just rounded on patient again and she was found to be asleep. RN woke patient and educated her on the importance of finishing the first bottle in order to move on to the second one. Patient awake and drinking prep. Will continue to monitor.

## 2018-01-02 NOTE — Transfer of Care (Signed)
Immediate Anesthesia Transfer of Care Note  Patient: Rose Ball  Procedure(s) Performed: ESOPHAGOGASTRODUODENOSCOPY (EGD) WITH PROPOFOL (N/A )  Patient Location: PACU and Endoscopy Unit  Anesthesia Type:MAC  Level of Consciousness: awake, alert  and oriented  Airway & Oxygen Therapy: Patient Spontanous Breathing  Post-op Assessment: Report given to RN and Post -op Vital signs reviewed and stable  Post vital signs: Reviewed and stable  Last Vitals:  Vitals Value Taken Time  BP    Temp    Pulse 60 01/02/2018  8:46 AM  Resp 20 01/02/2018  8:46 AM  SpO2 98 % 01/02/2018  8:46 AM  Vitals shown include unvalidated device data.  Last Pain:  Vitals:   01/02/18 0720  TempSrc: Oral  PainSc: 0-No pain         Complications: No apparent anesthesia complications

## 2018-01-02 NOTE — Progress Notes (Signed)
Spoke with staff from endo this morning- told that patient was unable to complete first bottle of prep till after midnight, therefore started second bottle late. This RN attempted to inform GI of this by calling [place] office and pressed 1 for the on-call MD but no answer x 3. Endo notified of this.   Patient is still having dark black liquid stools at this time- even after half of second bottle.

## 2018-01-02 NOTE — Progress Notes (Signed)
PROGRESS NOTE   Rose Ball  WCH:[id number]    DOB: 1945-11-17    DOA: 12/31/2017  PCP: Patient, No Pcp Per   I have briefly reviewed patients previous medical records in [facility].  Brief Narrative:  72 year old female, PMH of ADHD, persistent A. fib on Pradaxa (Dr. Rayann Heman, Cardiology), HTN, HLD, chronic back pain taking Naprosyn, presented with history of coffee-ground emesis, dizziness, weakness 2 days PTA, again had coffee-ground emesis in the ED while undergoing x-rays, admitted for acute upper GI bleed in the context of chronic anticoagulation, acute blood loss anemia.  [facility] was consulted, underwent EGD which showed gastric and duodenal ulcers, plan for colonoscopy 10/4.   Assessment & Plan:   Principal Problem:   UGIB (upper gastrointestinal bleed) Active Problems:   Persistent atrial fibrillation   Chronic anticoagulation, on Pradaxa   Blood loss anemia   Acute upper GI bleed: Possibly due to gastric and duodenal ulcers noted on EGD 10/3 by [facility].  Could be related to NSAID use.  Biopsies taken and ruling out H. pylori.  Remains on IV Protonix infusion. At discharge, Protonix 40 mg twice daily for 2 months followed by once daily.  If H. pylori positive, treat appropriately.  GI indicates okay to resume Pradaxa in 3 days.  Symptomatic acute blood loss anemia: Secondary to acute GI bleed.  Presented with hemoglobin of 6.9.  Status post 3 units PRBC.  Hemoglobin improved and stable at 9.8.  Follow CBCs.  Weight loss: As per history, patient had intentional weight loss of greater than 60-70 pounds in the past 6 months.  No prior history of colonoscopy.  Family history of colon cancer in mother.  Patient was under prep for colonoscopy today and plans for colonoscopy 10/4.  Persistent atrial fibrillation: Status post multiple cardioversions, loop recorder x2.  Currently in sinus rhythm.  Pradaxa on hold due to GI bleed but as per GI, may resume in 3 days.   Continue dofetilide and metoprolol.  Acute on stage III chronic kidney disease: Baseline creatinine probably in the 1.4-1.5 range.  Presented with creatinine of 1.76 which is improved to 1.4 and probably her baseline.  Chronic back pain: Advised avoiding NSAIDs and using Tylenol for pain.  ADHD: Stable.  Hyperlipidemia: Statins.  DVT prophylaxis: SCDs. Code Status: Full Family Communication: None at bedside Disposition: DC home pending clinical improvement and GI clearance post procedure.   Consultants:  [facility]  Procedures:  EGD 10/3  Antimicrobials:  None   Subjective: Patient seen postprocedure this morning.  Denied complaints.  Had BM this morning which was still dark.  No further nausea, vomiting.  No abdominal pain.  No dizziness, lightheadedness, dyspnea or chest pain.  ROS: As above.  Objective:  Vitals:   01/02/18 0900 01/02/18 0940 01/02/18 1227 01/02/18 1615  BP: (!) 161/49 (!) 153/49 (!) 154/65 (!) 157/69  Pulse: (!) 57 (!) 55 (!) 59 67  Resp: 19 20 20 20   Temp:  98.2 F (36.8 C) 98.2 F (36.8 C) 98.7 F (37.1 C)  TempSrc:  Oral Oral Oral  SpO2: 99% 100% 100% 100%  Weight:      Height:        Examination:  General exam: Pleasant elderly female, moderately built and nourished, sitting up comfortably in chair. Respiratory system: Clear to auscultation. Respiratory effort normal. Cardiovascular system: S1 & S2 heard, RRR. No JVD, murmurs, rubs, gallops or clicks. No pedal edema.  Telemetry personally reviewed: Sinus rhythm. Gastrointestinal system: Abdomen  is nondistended, soft and nontender. No organomegaly or masses felt. Normal bowel sounds heard. Central nervous system: Alert and oriented. No focal neurological deficits. Extremities: Symmetric 5 x 5 power. Skin: No rashes, lesions or ulcers Psychiatry: Judgement and insight appear normal. Mood & affect appropriate.     Data Reviewed: I have personally reviewed following labs and imaging  studies  CBC: Recent Labs  Lab 12/31/17 1838 01/01/18 0551 01/01/18 2115 01/02/18 0547  WBC 13.6* 11.7*  --  7.8  HGB 7.8* 6.9* 9.3* 9.8*  HCT 24.8* 21.2* 27.9* 30.4*  MCV 92.2 91.8  --  93.0  PLT 326 217  --  017   Basic Metabolic Panel: Recent Labs  Lab 12/31/17 1838 01/01/18 0551 01/02/18 0547  NA 139 139 142  K 4.2 3.8 4.0  CL 110 111 114*  CO2 19* 22 22  GLUCOSE 163* 107* 92  BUN 86* 83* 46*  CREATININE 1.76* 1.65* 1.43*  CALCIUM 8.9 8.3* 8.7*  MG  --   --  2.0   Liver Function Tests: Recent Labs  Lab 12/31/17 2230  AST 18  ALT 17  ALKPHOS 52  BILITOT 0.3  PROT 5.9*  ALBUMIN 3.4*   Coagulation Profile: Recent Labs  Lab 12/31/17 2230  INR 1.59    No results found for this or any previous visit (from the past 240 hour(s)).       Radiology Studies: Dg Chest 2 View  Result Date: 12/31/2017 CLINICAL DATA:  Short of breath EXAM: CHEST - 2 VIEW COMPARISON:  12/18/2010 FINDINGS: Small focus of atelectasis or scar at the left base. No consolidation or effusion. Normal heart size. No pneumothorax. In oval nodular opacity projects slightly inferior as compared to 2012 study, this was thought to reflect breast calcification. IMPRESSION: No active cardiopulmonary disease. Electronically Signed   By: Donavan Foil M.D.   On: 12/31/2017 19:28        Scheduled Meds: . atorvastatin  10 mg Oral q1800  . dofetilide  500 mcg Oral BID  . escitalopram  20 mg Oral Daily  . metoCLOPramide (REGLAN) injection  10 mg Intravenous Once  . metoprolol tartrate  50 mg Oral BID  . rOPINIRole  1 mg Oral QHS   Continuous Infusions: . sodium chloride 100 mL/hr (01/02/18 1517)  . pantoprozole (PROTONIX) infusion 8 mg/hr (01/02/18 1043)     LOS: 1 day     Vernell Leep, MD, FACP, [facility]. [facility] Pager [phone]  If 7PM-7AM, please contact night-coverage [email or web address] Password [email or web address] 01/02/2018, 5:41 PM

## 2018-01-02 NOTE — Anesthesia Postprocedure Evaluation (Signed)
Anesthesia Post Note  Patient: Rose Ball  Procedure(s) Performed: ESOPHAGOGASTRODUODENOSCOPY (EGD) WITH PROPOFOL (N/A ) BIOPSY     Patient location during evaluation: Endoscopy Anesthesia Type: MAC Level of consciousness: awake and alert Pain management: pain level controlled Vital Signs Assessment: post-procedure vital signs reviewed and stable Respiratory status: spontaneous breathing, nonlabored ventilation, respiratory function stable and patient connected to nasal cannula oxygen Cardiovascular status: stable and blood pressure returned to baseline Postop Assessment: no apparent nausea or vomiting Anesthetic complications: no    Last Vitals:  Vitals:   01/02/18 1615 01/02/18 1956  BP: (!) 157/69 (!) 166/52  Pulse: 67 64  Resp: 20 20  Temp: 37.1 C 36.9 C  SpO2: 100% 93%    Last Pain:  Vitals:   01/02/18 1956  TempSrc: Oral  PainSc:                  Ireta Pullman COKER

## 2018-01-02 NOTE — Op Note (Signed)
[facility] Patient Name: Rose Ball Procedure Date : 01/02/2018 MRN: [id number] Attending MD: Mauri Pole , MD Date of Birth: [birth year] CSN: [id number] Age: 72 Admit Type: Inpatient Procedure:                Upper GI endoscopy Indications:              Active gastrointestinal bleeding, Suspected upper                            gastrointestinal bleeding Providers:                Mauri Pole, MD, Burtis Junes, RN, Tinnie Gens, Technician Referring MD:              Medicines:                Monitored Anesthesia Care Complications:            No immediate complications. Estimated Blood Loss:     Estimated blood loss was minimal. Procedure:                Pre-Anesthesia Assessment:                           - Prior to the procedure, a History and Physical                            was performed, and patient medications and                            allergies were reviewed. The patient's tolerance of                            previous anesthesia was also reviewed. The risks                            and benefits of the procedure and the sedation                            options and risks were discussed with the patient.                            All questions were answered, and informed consent                            was obtained. Prior Anticoagulants: The patient                            last took Pradaxa (dabigatran) 2 days prior to the                            procedure. ASA Grade Assessment: II - A patient  with mild systemic disease. After reviewing the                            risks and benefits, the patient was deemed in                            satisfactory condition to undergo the procedure.                           After obtaining informed consent, the endoscope was                            passed under direct vision. Throughout the                            procedure,  the patient's blood pressure, pulse, and                            oxygen saturations were monitored continuously. The                            GIF-H190 ([id number]) Olympus Adult EGD was introduced                            through the mouth, and advanced to the second part                            of duodenum. The upper GI endoscopy was                            accomplished without difficulty. The patient                            tolerated the procedure well. Scope In: Scope Out: Findings:      One benign-appearing, intrinsic mild stenosis was found 35 to 38 cm from       the incisors. This stenosis measured 1.5 cm (inner diameter) x less than       one cm (in length). The stenosis was traversed.      A small hiatal hernia was present.      Few cratered and superficial gastric ulcers with a clean ulcer base       (Forrest Class III) were found in the gastric antrum, in the prepyloric       region of the stomach and at the pylorus. The largest lesion was 3 mm in       largest dimension. Biopsies were taken with a cold forceps for       Helicobacter pylori testing.      Two non-bleeding superficial duodenal ulcers with a clean ulcer base       (Forrest Class III) were found in the duodenal bulb. The largest lesion       was 4 mm in largest dimension. Impression:               - Benign-appearing esophageal stenosis.                           -  Small hiatal hernia.                           - Gastric ulcers with a clean ulcer base (Forrest                            Class III). Biopsied.                           - Multiple non-bleeding duodenal ulcers with a                            clean ulcer base (Forrest Class III). Recommendation:           - Patient has a contact number available for                            emergencies. The signs and symptoms of potential                            delayed complications were discussed with the                            patient. Return  to normal activities tomorrow.                            Written discharge instructions were provided to the                            patient.                           - Resume clear liquids                           - Continue present medications.                           - Protonix PO 40mg  twice daily 2 months followed by                            once daily                           - Await pathology results and H.pylori status,                            treat if positive.                           - Will plan to do colonoscopy tomorrow with                            additional bowel prep if patient is willing to  proceed                           - Resume Pradaxa (dabigatran) at prior dose in 3                            days. Refer to managing physician for further                            adjustment of therapy. Procedure Code(s):        --- Professional ---                           [phone], Esophagogastroduodenoscopy, flexible,                            transoral; with biopsy, single or multiple Diagnosis Code(s):        --- Professional ---                           K22.2, Esophageal obstruction                           K44.9, Diaphragmatic hernia without obstruction or                            gangrene                           K25.9, Gastric ulcer, unspecified as acute or                            chronic, without hemorrhage or perforation                           K26.9, Duodenal ulcer, unspecified as acute or                            chronic, without hemorrhage or perforation                           K92.2, Gastrointestinal hemorrhage, unspecified CPT copyright 2017 American [employer]. All rights reserved. The codes documented in this report are preliminary and upon coder review may  be revised to meet current compliance requirements. Mauri Pole, MD 01/02/2018 9:04:25 AM This report has been signed  electronically. Number of Addenda: 0

## 2018-01-02 NOTE — Progress Notes (Signed)
   No bowel prep ordered - colonoscopy 1100 tomorrow  Patient told RN she prefers not to take a prep  After discussion she agreed  I have decided to prep all at once over 4 hrs and to give tap water enema in AM  Gatha Mayer, MD, [facility] 01/02/2018 8:48 PM Pager [phone]

## 2018-01-02 NOTE — Progress Notes (Signed)
Call placed to Dr. Silverio Decamp to inform patient has not completed bowel prep and bowel movements dark black liquid at this time. Scheduled for 0800 EGD and colonoscopy. Patient drank bowel prep up until 0630. Per Dr. Silverio Decamp will proceed with upper endoscopy at time specified by anesthesia MD for NPO status. Call placed to Dr. Linna Caprice, advised drank bowel prep up until 0630. Per Dr. Linna Caprice patient needs to be NPO for a minimum of 2 hours prior to sedation. Advised Dr. Silverio Decamp of change to 0830 start time.

## 2018-01-02 NOTE — Anesthesia Preprocedure Evaluation (Signed)
Anesthesia Evaluation  Patient identified by MRN, date of birth, ID band Patient awake    Reviewed: Allergy & Precautions, NPO status , Patient's Chart, lab work & pertinent test results  Airway Mallampati: II  TM Distance: >3 FB Neck ROM: Full    Dental  (+) Teeth Intact, Dental Advisory Given   Pulmonary    breath sounds clear to auscultation       Cardiovascular hypertension,  Rhythm:Regular Rate:Normal     Neuro/Psych    GI/Hepatic   Endo/Other    Renal/GU      Musculoskeletal   Abdominal   Peds  Hematology   Anesthesia Other Findings   Reproductive/Obstetrics                             Anesthesia Physical Anesthesia Plan  ASA: III  Anesthesia Plan: MAC   Post-op Pain Management:    Induction: Intravenous  PONV Risk Score and Plan:   Airway Management Planned: Natural Airway and Simple Face Mask  Additional Equipment:   Intra-op Plan:   Post-operative Plan:   Informed Consent: I have reviewed the patients History and Physical, chart, labs and discussed the procedure including the risks, benefits and alternatives for the proposed anesthesia with the patient or authorized representative who has indicated his/her understanding and acceptance.     Plan Discussed with: CRNA and Anesthesiologist  Anesthesia Plan Comments:         Anesthesia Quick Evaluation

## 2018-01-02 NOTE — Progress Notes (Signed)
Pt able to complete a bottle and a half of prep- stool is liquid but black in color. Encouraged pt to finish drinking prep. Will pass on to day RN

## 2018-01-03 ENCOUNTER — Telehealth: Payer: Self-pay

## 2018-01-03 ENCOUNTER — Inpatient Hospital Stay (HOSPITAL_COMMUNITY): Payer: PPO | Admitting: Anesthesiology

## 2018-01-03 ENCOUNTER — Encounter (HOSPITAL_COMMUNITY): Payer: Self-pay

## 2018-01-03 ENCOUNTER — Other Ambulatory Visit: Payer: Self-pay

## 2018-01-03 ENCOUNTER — Encounter (HOSPITAL_COMMUNITY): Payer: Self-pay | Admitting: *Deleted

## 2018-01-03 ENCOUNTER — Encounter (HOSPITAL_COMMUNITY)
Admission: EM | Disposition: A | Discharge status: Home / Self Care | Payer: Self-pay | Source: Home / Self Care | Attending: Internal Medicine

## 2018-01-03 DIAGNOSIS — Z1211 Encounter for screening for malignant neoplasm of colon: Secondary | ICD-10-CM

## 2018-01-03 DIAGNOSIS — D500 Iron deficiency anemia secondary to blood loss (chronic): Secondary | ICD-10-CM

## 2018-01-03 DIAGNOSIS — D122 Benign neoplasm of ascending colon: Secondary | ICD-10-CM

## 2018-01-03 DIAGNOSIS — Z800 Family history of malignant neoplasm of digestive organs: Secondary | ICD-10-CM

## 2018-01-03 DIAGNOSIS — K635 Polyp of colon: Secondary | ICD-10-CM

## 2018-01-03 HISTORY — PX: POLYPECTOMY: SHX5525

## 2018-01-03 HISTORY — PX: COLONOSCOPY: SHX5424

## 2018-01-03 LAB — BASIC METABOLIC PANEL
Anion gap: 7 (ref 5–15)
BUN: 26 mg/dL — AB (ref 8–23)
CHLORIDE: 113 mmol/L — AB (ref 98–111)
CO2: 21 mmol/L — ABNORMAL LOW (ref 22–32)
Calcium: 8.7 mg/dL — ABNORMAL LOW (ref 8.9–10.3)
Creatinine, Ser: 1.3 mg/dL — ABNORMAL HIGH (ref 0.44–1.00)
GFR, EST AFRICAN AMERICAN: 47 mL/min — AB (ref >60)
GFR, EST NON AFRICAN AMERICAN: 41 mL/min — AB (ref >60)
Glucose, Bld: 93 mg/dL (ref 70–99)
POTASSIUM: 3.7 mmol/L (ref 3.5–5.1)
SODIUM: 141 mmol/L (ref 135–145)

## 2018-01-03 LAB — CBC
HCT: 26.7 % — ABNORMAL LOW (ref 36.0–46.0)
HEMOGLOBIN: 8.9 g/dL — AB (ref 12.0–15.0)
MCH: 30.8 pg (ref 26.0–34.0)
MCHC: 33.3 g/dL (ref 30.0–36.0)
MCV: 92.4 fL (ref 78.0–100.0)
PLATELETS: 207 10*3/uL (ref 150–400)
RBC: 2.89 MIL/uL — AB (ref 3.87–5.11)
RDW: 14.5 % (ref 11.5–15.5)
WBC: 7 10*3/uL (ref 4.0–10.5)

## 2018-01-03 SURGERY — COLONOSCOPY
Anesthesia: Monitor Anesthesia Care

## 2018-01-03 MED ORDER — LIDOCAINE 2% (20 MG/ML) 5 ML SYRINGE
INTRAMUSCULAR | Status: DC | PRN
  Administered 2018-01-03: 60 mg via INTRAVENOUS

## 2018-01-03 MED ORDER — PANTOPRAZOLE SODIUM 40 MG PO TBEC: 40.0000 mg | DELAYED_RELEASE_TABLET | Freq: Two times a day (BID) | ORAL | Status: DC

## 2018-01-03 MED ORDER — OXYCODONE HCL 5 MG PO TABS: 5.0000 mg | ORAL_TABLET | Freq: Once | ORAL | Status: DC | PRN

## 2018-01-03 MED ORDER — ONDANSETRON HCL 4 MG/2ML IJ SOLN: 4.0000 mg | Freq: Once | INTRAMUSCULAR | Status: DC | PRN

## 2018-01-03 MED ORDER — PANTOPRAZOLE SODIUM 40 MG PO TBEC: 40.0000 mg | DELAYED_RELEASE_TABLET | Freq: Two times a day (BID) | ORAL | 1 refills | Status: DC

## 2018-01-03 MED ORDER — PROPOFOL 10 MG/ML IV BOLUS
INTRAVENOUS | Status: DC | PRN
  Administered 2018-01-03: 50 mg via INTRAVENOUS

## 2018-01-03 MED ORDER — FENTANYL CITRATE (PF) 100 MCG/2ML IJ SOLN: 25.0000 ug | INTRAMUSCULAR | Status: DC | PRN

## 2018-01-03 MED ORDER — OXYCODONE HCL 5 MG/5ML PO SOLN: 5.0000 mg | Freq: Once | ORAL | Status: DC | PRN

## 2018-01-03 MED ORDER — PROPOFOL 500 MG/50ML IV EMUL
INTRAVENOUS | Status: DC | PRN
  Administered 2018-01-03: 100 ug/kg/min via INTRAVENOUS

## 2018-01-03 MED ORDER — DABIGATRAN ETEXILATE MESYLATE 150 MG PO CAPS: ORAL_CAPSULE | ORAL | Status: DC

## 2018-01-03 MED ORDER — GLYCOPYRROLATE PF 0.2 MG/ML IJ SOSY
PREFILLED_SYRINGE | INTRAMUSCULAR | Status: DC | PRN
  Administered 2018-01-03 (×2): .1 mg via INTRAVENOUS

## 2018-01-03 NOTE — Transfer of Care (Signed)
Immediate Anesthesia Transfer of Care Note  Patient: Rose Ball  Procedure(s) Performed: COLONOSCOPY (N/A ) POLYPECTOMY  Patient Location: Endoscopy Unit  Anesthesia Type:MAC  Level of Consciousness: awake, alert  and oriented  Airway & Oxygen Therapy: Patient Spontanous Breathing and Patient connected to face mask oxygen  Post-op Assessment: Report given to RN and Post -op Vital signs reviewed and stable  Post vital signs: Reviewed and stable  Last Vitals:  Vitals Value Taken Time  BP 150/61 01/03/2018 12:06 PM  Temp    Pulse 74 01/03/2018 12:06 PM  Resp 11 01/03/2018 12:06 PM  SpO2 100 % 01/03/2018 12:06 PM  Vitals shown include unvalidated device data.  Last Pain:  Vitals:   01/03/18 0955  TempSrc: Oral  PainSc: 0-No pain         Complications: No apparent anesthesia complications

## 2018-01-03 NOTE — Op Note (Addendum)
[facility] Patient Name: Rose Ball Procedure Date : 01/03/2018 MRN: [id number] Attending MD: Mauri Pole , MD Date of Birth: 04-29-1945 CSN: [id number] Age: 72 Admit Type: Inpatient Procedure:                Colonoscopy Indications:              Screening patient at increased risk: Family history                            of 1st-degree relative with colorectal cancer at                            age 16 years (or older), This is the patient's                            first colonoscopy Providers:                Mauri Pole, MD, Carlyn Reichert, RN, Cherylynn Ridges, Technician Referring MD:              Medicines:                Monitored Anesthesia Care Complications:            No immediate complications. Estimated Blood Loss:     Estimated blood loss was minimal. Procedure:                Pre-Anesthesia Assessment:                           - Prior to the procedure, a History and Physical                            was performed, and patient medications and                            allergies were reviewed. The patient's tolerance of                            previous anesthesia was also reviewed. The risks                            and benefits of the procedure and the sedation                            options and risks were discussed with the patient.                            All questions were answered, and informed consent                            was obtained. Prior Anticoagulants: The patient  last took Pradaxa (dabigatran) 3 days prior to the                            procedure. ASA Grade Assessment: II - A patient                            with mild systemic disease. After reviewing the                            risks and benefits, the patient was deemed in                            satisfactory condition to undergo the procedure.                           After obtaining  informed consent, the colonoscope                            was passed under direct vision. Throughout the                            procedure, the patient's blood pressure, pulse, and                            oxygen saturations were monitored continuously. The                            PCF-H190DL ([id number]) peds colon was introduced                            through the anus and advanced to the the cecum,                            identified by appendiceal orifice and ileocecal                            valve. The colonoscopy was performed without                            difficulty. The patient tolerated the procedure                            well. The quality of the bowel preparation was                            excellent. The ileocecal valve, appendiceal                            orifice, and rectum were photographed. Scope In: 11:34:39 AM Scope Out: 11:58:30 AM Scope Withdrawal Time: 0 hours 12 minutes 44 seconds  Total Procedure Duration: 0 hours 23 minutes 51 seconds  Findings:      The perianal and digital rectal examinations were normal.      A 3 mm polyp was found  in the ascending colon. The polyp was flat. The       polyp was removed with a cold biopsy forceps. Resection and retrieval       were complete.      Scattered small-mouthed diverticula were found in the sigmoid colon.      Non-bleeding internal hemorrhoids were found during retroflexion. The       hemorrhoids were small. Impression:               - One 3 mm polyp in the ascending colon, removed                            with a cold biopsy forceps. Resected and retrieved.                           - Moderate diverticulosis in the sigmoid colon.                           - Non-bleeding internal hemorrhoids. Recommendation:           - Patient has a contact number available for                            emergencies. The signs and symptoms of potential                            delayed complications  were discussed with the                            patient. Return to normal activities tomorrow.                            Written discharge instructions were provided to the                            patient.                           - Resume previous diet.                           - Continue present medications.                           - Resume Pradaxa (dabigatran) at prior dose in 2                            days. Refer to managing physician for further                            adjustment of therapy.                           - No ibuprofen, naproxen, or other non-steroidal                            anti-inflammatory drugs.                           -  Await pathology results.                           - Repeat colonoscopy in 5 years for surveillance                            based on pathology results.                           - Recheck Hemoglobin in 1 week                           - Continue Protonix twice daily, 30 minutes before                            breakfast and dinner X 2 months                           - Return to GI office as needed                           - OK to discharge home today from GI standpoint. Procedure Code(s):        --- Professional ---                           [phone], Colonoscopy, flexible; with biopsy, single                            or multiple Diagnosis Code(s):        --- Professional ---                           Z80.0, Family history of malignant neoplasm of                            digestive organs                           D12.2, Benign neoplasm of ascending colon                           K64.8, Other hemorrhoids                           K57.30, Diverticulosis of large intestine without                            perforation or abscess without bleeding CPT copyright 2017 American Medical Association. All rights reserved. The codes documented in this report are preliminary and upon coder review may  be revised to meet current  compliance requirements. Mauri Pole, MD 01/03/2018 12:08:14 PM This report has been signed electronically. Number of Addenda: 0

## 2018-01-03 NOTE — Progress Notes (Signed)
Discharge to home.D/c instructions and follow up appointments discussed with patient, verbalized understanding.

## 2018-01-03 NOTE — Interval H&P Note (Signed)
History and Physical Interval Note:  01/03/2018 11:12 AM  Rose Ball  has presented today for surgery, with the diagnosis of Anemia  The various methods of treatment have been discussed with the patient and family. After consideration of risks, benefits and other options for treatment, the patient has consented to  Procedure(s): COLONOSCOPY (N/A) as a surgical intervention .  The patient's history has been reviewed, patient examined, no change in status, stable for surgery.  I have reviewed the patient's chart and labs.  Questions were answered to the patient's satisfaction.     Francenia Chimenti

## 2018-01-03 NOTE — Progress Notes (Signed)
0735am- Tap water enema done, with  clear output.

## 2018-01-03 NOTE — Anesthesia Preprocedure Evaluation (Signed)
Anesthesia Evaluation  Patient identified by MRN, date of birth, ID band Patient awake    Reviewed: Allergy & Precautions, NPO status , Patient's Chart, lab work & pertinent test results  Airway Mallampati: II  TM Distance: >3 FB Neck ROM: Full    Dental  (+) Teeth Intact, Dental Advisory Given   Pulmonary neg pulmonary ROS,    breath sounds clear to auscultation       Cardiovascular hypertension, Pt. on home beta blockers and Pt. on medications + dysrhythmias Atrial Fibrillation  Rhythm:Regular Rate:Normal     Neuro/Psych negative neurological ROS     GI/Hepatic Neg liver ROS, GI bleed   Endo/Other  negative endocrine ROS  Renal/GU negative Renal ROS     Musculoskeletal   Abdominal   Peds  Hematology  (+) Blood dyscrasia, anemia ,   Anesthesia Other Findings   Reproductive/Obstetrics                             Anesthesia Physical  Anesthesia Plan  ASA: III  Anesthesia Plan: MAC   Post-op Pain Management:    Induction: Intravenous  PONV Risk Score and Plan: 2 and Propofol infusion, Ondansetron and Treatment may vary due to age or medical condition  Airway Management Planned: Natural Airway and Simple Face Mask  Additional Equipment:   Intra-op Plan:   Post-operative Plan:   Informed Consent: I have reviewed the patients History and Physical, chart, labs and discussed the procedure including the risks, benefits and alternatives for the proposed anesthesia with the patient or authorized representative who has indicated his/her understanding and acceptance.     Plan Discussed with: CRNA and Anesthesiologist  Anesthesia Plan Comments:         Anesthesia Quick Evaluation

## 2018-01-03 NOTE — Discharge Instructions (Signed)

## 2018-01-03 NOTE — Discharge Summary (Signed)
Physician Discharge Summary  Rose Ball Rose Ball DOB: 1945-09-22  PCP: Rose Ball  Admit date: 12/31/2017 Discharge date: 01/03/2018  Recommendations for Outpatient Follow-up:  1. Rose Ball, Cardiology in 1 week with repeat labs (CBC & BMP).  I have sent an in basket message to [facility] card master to assist with appointment. 2. Rose Ball, Motley Ball to follow-up on EGD and colonoscopy biopsy results.  Home Health: None Equipment/Devices: None  Discharge Condition: Improved and stable CODE STATUS: Full Diet recommendation: Heart healthy diet.  Discharge Diagnoses:  Principal Problem:   UGIB (upper gastrointestinal bleed) Active Problems:   Persistent atrial fibrillation   Chronic anticoagulation, on Pradaxa   Blood loss anemia   Family history of colon cancer in mother   Polyp of ascending colon   Brief Summary: 72 year old female, PMH of ADHD, persistent A. fib on Pradaxa (Rose Ball, Cardiology), HTN, HLD, chronic back pain taking Naprosyn, presented with history of coffee-ground emesis, dizziness, weakness 2 days PTA, again had coffee-ground emesis in the ED while undergoing x-rays, admitted for acute upper Ball bleed in the context of chronic anticoagulation, acute blood loss anemia.  Beardsley Ball was consulted, underwent EGD which showed gastric and duodenal ulcers, and underwent colonoscopy on 10/4.   Assessment & Plan:   Acute upper Ball bleed:  She was treated with bowel rest, IV Protonix infusion.  Rose Ball was consulted.  Possibly due to gastric and duodenal ulcers noted on EGD 10/3 by Rose Ball.  Could be related to NSAID use.  Biopsies taken and ruling out H. pylori. At discharge, Protonix 40 mg twice daily for 2 months followed by once daily.  If H. pylori positive, treat appropriately, Rose Ball to follow outpatient.  Ball indicates okay to resume Pradaxa in 2 days.  No further clinical Ball bleed.  Symptomatic acute blood  loss anemia: Secondary to acute Ball bleed.  Presented with hemoglobin of 6.9.  Status post 3 units PRBC.  Hemoglobin improved.  Hemoglobin has dropped from 9.8-8.9, likely equilibrating.  No overt bleeding.  Asymptomatic.  Follow CBC in a week's time.  Weight loss: As Ball history, patient had intentional weight loss of greater than 60-70 pounds in the past 6 months.  No prior history of colonoscopy.  Family history of colon cancer in mother.    Thereby patient underwent colonoscopy with results as below.  Rose Ball to follow-up with pathology results.  Persistent atrial fibrillation: Status post multiple cardioversions, loop recorder x2.  Currently in sinus rhythm.  Pradaxa on hold due to Ball bleed but as Ball Ball, may resume in 2 days.  Continue dofetilide and metoprolol.  Acute on stage III chronic kidney disease: Baseline creatinine probably in the 1.4-1.5 range.  Presented with creatinine of 1.76 which has improved to 1.3.  Follow BMP as outpatient.  Chronic back pain: Advised avoiding NSAIDs and using Tylenol for pain.  She verbalized understanding.  ADHD: Stable.  Continue prior home medicines.  Hyperlipidemia: Statins.    Consultants:  Rose Ball  Procedures:  EGD 10/3  Impression: Benign-appearing esophageal stenosis.  Small hiatal hernia.  Gastric ulcers with a clean ulcer base [facility] class III) biopsied.  Multiple nonbleeding duodenal ulcers with a clean ulcer base [facility] class III)  Colonoscopy 01/03/2018  Impression: One 3 mm polyp in the ascending colon, removed with a cold biopsy forcep.  Resected and retrieved.  Moderate diverticulosis in the sigmoid colon.  Nonbleeding internal hemorrhoids.   Discharge Instructions  Discharge  Instructions    Call Ball for:   Complete by:  As directed    Recurrent vomiting of dark or coffee-ground material or blood or passing black tarry stools or blood in stools.   Call Ball for:  difficulty breathing, headache or visual  disturbances   Complete by:  As directed    Call Ball for:  extreme fatigue   Complete by:  As directed    Call Ball for:  persistant dizziness or light-headedness   Complete by:  As directed    Call Ball for:  persistant nausea and vomiting   Complete by:  As directed    Call Ball for:  severe uncontrolled pain   Complete by:  As directed    Diet - low sodium heart healthy   Complete by:  As directed    Increase activity slowly   Complete by:  As directed        Medication List    TAKE these medications   amphetamine-dextroamphetamine 15 MG tablet Commonly known as:  ADDERALL Take 15 mg by mouth daily as needed (for additional focus).   atorvastatin 10 MG tablet Commonly known as:  LIPITOR TAKE 1 TABLET BY MOUTH EVERY DAY What changed:  when to take this   dabigatran 150 MG Caps capsule Commonly known as:  PRADAXA TAKE 1 CAPSULE (150 MG TOTAL) BY MOUTH 2 (TWO) TIMES DAILY. Stopped in the hospital due to stomach bleed.  Restart taking it on 01/06/2018. What changed:  additional instructions   dofetilide 500 MCG capsule Commonly known as:  TIKOSYN Take 1 capsule (500 mcg total) by mouth 2 (two) times daily.   escitalopram 20 MG tablet Commonly known as:  LEXAPRO Take 20 mg by mouth Daily. Lexapro   KLOR-CON M10 10 MEQ tablet Generic drug:  potassium chloride TAKE 1 TABLET (10 MEQ TOTAL) BY MOUTH DAILY. NO REFILLS UNTIL APPT [phone] What changed:  See the new instructions.   metoprolol tartrate 50 MG tablet Commonly known as:  LOPRESSOR TAKE 1 TABLET BY MOUTH TWICE A DAY   OVER THE COUNTER MEDICATION Take 400 mg by mouth 2 (two) times daily. Magnical/D Take 1 tablet by mouth twice a day   pantoprazole 40 MG tablet Commonly known as:  PROTONIX Take 1 tablet (40 mg total) by mouth 2 (two) times daily before a meal.   rOPINIRole 1 MG tablet Commonly known as:  REQUIP Take 1 mg by mouth at bedtime.   VYVANSE 70 MG capsule Generic drug:  lisdexamfetamine Take 70  mg by mouth Daily.      Follow-up Information    Rose Ball. Schedule an appointment as soon as possible for a visit in 1 week(s).   Specialty:  Cardiology Why:  To be seen with repeat labs (CBC & BMP). Contact information: [address] [place] [address] [phone]        Mauri Pole, Ball. Schedule an appointment as soon as possible for a visit.   Specialty:  Gastroenterology Contact information: [place] [address] [phone]          Allergies  Allergen Reactions  . Amiodarone Other (See Comments)    REACTION: dyspnea      Procedures/Studies: Dg Chest 2 View  Result Date: 12/31/2017 CLINICAL DATA:  Short of breath EXAM: CHEST - 2 VIEW COMPARISON:  12/18/2010 FINDINGS: Small focus of atelectasis or scar at the left base. No consolidation or effusion. Normal heart size. No pneumothorax. In  oval nodular opacity projects slightly inferior as compared to 2012 study, this was thought to reflect breast calcification. IMPRESSION: No active cardiopulmonary disease. Electronically Signed   By: Donavan Foil M.D.   On: 12/31/2017 19:28      Subjective: Patient denies complaints.  No abdominal pain, nausea or vomiting.  Underwent bowel prep this morning with returns clear without black or blood.  No chest pain, dyspnea, dizziness, lightheadedness or palpitations.  Discharge Exam:  Vitals:   01/03/18 1215 01/03/18 1216 01/03/18 1220 01/03/18 1245  BP:  (!) 151/72  (!) 155/69  Pulse:  80  70  Resp:  16  19  Temp:    (!) 97.4 F (36.3 C)  TempSrc:    Oral  SpO2: 99% 100% 99% 99%  Weight:      Height:        General exam: Pleasant elderly female, moderately built and nourished, sitting up comfortably in chair. Respiratory system: Clear to auscultation. Respiratory effort normal. Cardiovascular system: S1 & S2 heard, RRR. No JVD, murmurs, rubs, gallops or clicks. No pedal edema.  Gastrointestinal system: Abdomen  is nondistended, soft and nontender. No organomegaly or masses felt. Normal bowel sounds heard. Central nervous system: Alert and oriented. No focal neurological deficits. Extremities: Symmetric 5 x 5 power. Skin: No rashes, lesions or ulcers Psychiatry: Judgement and insight appear normal. Mood & affect appropriate.    The results of significant diagnostics from this hospitalization (including imaging, microbiology, ancillary and laboratory) are listed below for reference.       Labs: CBC: Recent Labs  Lab 12/31/17 1838 01/01/18 0551 01/01/18 2115 01/02/18 0547 01/03/18 0530  WBC 13.6* 11.7*  --  7.8 7.0  HGB 7.8* 6.9* 9.3* 9.8* 8.9*  HCT 24.8* 21.2* 27.9* 30.4* 26.7*  MCV 92.2 91.8  --  93.0 92.4  PLT 326 217  --  232 443   Basic Metabolic Panel: Recent Labs  Lab 12/31/17 1838 01/01/18 0551 01/02/18 0547 01/03/18 0530  NA 139 139 142 141  K 4.2 3.8 4.0 3.7  CL 110 111 114* 113*  CO2 19* 22 22 21*  GLUCOSE 163* 107* 92 93  BUN 86* 83* 46* 26*  CREATININE 1.76* 1.65* 1.43* 1.30*  CALCIUM 8.9 8.3* 8.7* 8.7*  MG  --   --  2.0  --    Liver Function Tests: Recent Labs  Lab 12/31/17 2230  AST 18  ALT 17  ALKPHOS 52  BILITOT 0.3  PROT 5.9*  ALBUMIN 3.4*   I discussed in detail with patient's sister-in-law who works in the recovery room at the [facility].  I updated care and answered questions.  Patient does not have a PCP to follow-up with labs.  I offered case management consult to assist finding a PCP.  Patient and family would rather follow-up with the A. fib clinic for repeat labs in 1 week.   Time coordinating discharge: 40 minutes  SIGNED:  Vernell Leep, Ball, FACP, [facility]. [facility] Pager [phone]  If 7PM-7AM, please contact night-coverage [email or web address] Password [email or web address] 01/03/2018, 2:59 PM

## 2018-01-03 NOTE — Telephone Encounter (Signed)
Lab order entered.

## 2018-01-04 ENCOUNTER — Encounter (HOSPITAL_COMMUNITY): Payer: Self-pay | Admitting: Gastroenterology

## 2018-01-05 ENCOUNTER — Other Ambulatory Visit: Payer: Self-pay | Admitting: Nurse Practitioner

## 2018-01-06 ENCOUNTER — Other Ambulatory Visit: Payer: Self-pay | Admitting: *Deleted

## 2018-01-06 ENCOUNTER — Other Ambulatory Visit: Payer: Self-pay

## 2018-01-06 NOTE — Patient Outreach (Signed)
[place] [facility]) Care Management  01/06/2018  Rose Ball April 03, 1945 [id number]   Initial transition of care call unsuccessful however RN able to leave a HIPAA approved voice message requesting a call back. Will further engage at that time.   Raina Mina, RN Care Management Coordinator [place] Office [phone]

## 2018-01-08 ENCOUNTER — Other Ambulatory Visit: Payer: Self-pay | Admitting: *Deleted

## 2018-01-08 NOTE — Patient Outreach (Signed)
[place] [facility]) Care Management  01/08/2018  Rose Ball 30-Jan-1946 [id number]    Transition of care  RN attempted outreach call to pt today however unsuccessful but was able to leave a HIPAA approved voice message requesting a call back. Will rescheduled one additional call and allow pt time to respond to the outreach letter and messages left via pt's voice mail.   Raina Mina, RN Care Management Coordinator [place] Office [phone]

## 2018-01-13 ENCOUNTER — Ambulatory Visit (HOSPITAL_COMMUNITY)
Admit: 2018-01-13 | Discharge: 2018-01-13 | Disposition: A | Discharge status: Home / Self Care | Payer: PPO | Attending: Nurse Practitioner | Admitting: Nurse Practitioner

## 2018-01-13 VITALS — BP 152/70 | HR 52 | Ht 69.0 in | Wt 175.0 lb

## 2018-01-13 DIAGNOSIS — R001 Bradycardia, unspecified: Secondary | ICD-10-CM | POA: Diagnosis not present

## 2018-01-13 DIAGNOSIS — Z79899 Other long term (current) drug therapy: Secondary | ICD-10-CM | POA: Diagnosis not present

## 2018-01-13 DIAGNOSIS — I4819 Other persistent atrial fibrillation: Secondary | ICD-10-CM | POA: Insufficient documentation

## 2018-01-13 DIAGNOSIS — E785 Hyperlipidemia, unspecified: Secondary | ICD-10-CM | POA: Insufficient documentation

## 2018-01-13 DIAGNOSIS — Z8249 Family history of ischemic heart disease and other diseases of the circulatory system: Secondary | ICD-10-CM | POA: Insufficient documentation

## 2018-01-13 DIAGNOSIS — Z7901 Long term (current) use of anticoagulants: Secondary | ICD-10-CM | POA: Insufficient documentation

## 2018-01-13 DIAGNOSIS — F329 Major depressive disorder, single episode, unspecified: Secondary | ICD-10-CM | POA: Diagnosis not present

## 2018-01-13 DIAGNOSIS — I480 Paroxysmal atrial fibrillation: Secondary | ICD-10-CM | POA: Diagnosis not present

## 2018-01-13 DIAGNOSIS — I10 Essential (primary) hypertension: Secondary | ICD-10-CM | POA: Insufficient documentation

## 2018-01-13 LAB — BASIC METABOLIC PANEL
ANION GAP: 7 (ref 5–15)
BUN: 23 mg/dL (ref 8–23)
CHLORIDE: 105 mmol/L (ref 98–111)
CO2: 27 mmol/L (ref 22–32)
Calcium: 8.9 mg/dL (ref 8.9–10.3)
Creatinine, Ser: 1.67 mg/dL — ABNORMAL HIGH (ref 0.44–1.00)
GFR calc Af Amer: 34 mL/min — ABNORMAL LOW (ref >60)
GFR, EST NON AFRICAN AMERICAN: 30 mL/min — AB (ref >60)
GLUCOSE: 96 mg/dL (ref 70–99)
POTASSIUM: 4.6 mmol/L (ref 3.5–5.1)
Sodium: 139 mmol/L (ref 135–145)

## 2018-01-13 LAB — CBC
HEMATOCRIT: 30.3 % — AB (ref 36.0–46.0)
HEMOGLOBIN: 9.4 g/dL — AB (ref 12.0–15.0)
MCH: 28.5 pg (ref 26.0–34.0)
MCHC: 31 g/dL (ref 30.0–36.0)
MCV: 91.8 fL (ref 80.0–100.0)
Platelets: 319 10*3/uL (ref 150–400)
RBC: 3.3 MIL/uL — AB (ref 3.87–5.11)
RDW: 14.1 % (ref 11.5–15.5)
WBC: 6 10*3/uL (ref 4.0–10.5)
nRBC: 0 % (ref 0.0–0.2)

## 2018-01-13 NOTE — Anesthesia Postprocedure Evaluation (Signed)
Anesthesia Post Note  Patient: Rose Ball  Procedure(s) Performed: COLONOSCOPY (N/A ) POLYPECTOMY     Patient location during evaluation: PACU Anesthesia Type: MAC Level of consciousness: awake and alert Pain management: pain level controlled Vital Signs Assessment: post-procedure vital signs reviewed and stable Respiratory status: spontaneous breathing, nonlabored ventilation, respiratory function stable and patient connected to nasal cannula oxygen Cardiovascular status: stable and blood pressure returned to baseline Postop Assessment: no apparent nausea or vomiting Anesthetic complications: no    Last Vitals:  Vitals:   01/03/18 1220 01/03/18 1245  BP:  (!) 155/69  Pulse:  70  Resp:  19  Temp:  (!) 36.3 C  SpO2: 99% 99%    Last Pain:  Vitals:   01/03/18 1245  TempSrc: Oral  PainSc:                  Tiajuana Amass

## 2018-01-13 NOTE — Progress Notes (Signed)
Patient ID: Rose Ball, female   DOB: 02/11/1946, 72 y.o.   MRN: [id number]     Primary Care Physician: Patient, No Pcp Per Referring Physician: Dr. Nyoka Lint is a 72 y.o. female with a h/o afib, s/p convergent procedure 2013, on tikosyn. She is in the clinic for f/u of a recent GI bleed, was taking naprosyn with pradaxa for low back pain. Had presenting HGB of 6.9, received 3 units of blood. Was found to have a gastric ulcer, endoscopy and colonoscopy was done in the hospital.. She  is being seen here for f/u as she still does not have a PCP.  This in the past has been  stressed to the pt importance of getting established with one. As of this appointment, she still has not committed to make an appointment to get established. She is still feeling some weakness and shortness of breath with exertion. She has remained in SR during this time. No further bleeding issues.   Today, she denies symptoms of palpitations, chest pain,  orthopnea, PND, lower extremity edema, dizziness, presyncope, syncope, or neurologic sequela.  + for dizziness, shortness of breath with exertion.The patient is tolerating medications without difficulties and is otherwise without complaint today.   Past Medical History:  Diagnosis Date  . Attention deficit    on Vyvanse  . Chronic anticoagulation   . Depression   . Dyslipidemia   . Hypertension   . Hypokalemia   . Morbid obesity ([place])   . Palpitations   . Persistent atrial fibrillation 05/27/14 Chadsvasc score of 4   s/p cardioversion Sept 2012; failed   Past Surgical History:  Procedure Laterality Date  . BIOPSY  01/02/2018   Procedure: BIOPSY;  Surgeon: Mauri Pole, MD;  Location: [place];  Service: Endoscopy;;  . BREAST BIOPSY    . CARDIOVERSION  Sept 2012   failed  . CARDIOVERSION N/A 09/08/2012   Procedure: CARDIOVERSION;  Surgeon: Fay Records, MD;  Location: [facility] ENDOSCOPY;  Service: Cardiovascular;  Laterality: N/A;  .  CARDIOVERSION N/A 02/15/2013   Procedure: CARDIOVERSION-BEDSIDE;  Surgeon: Darlin Coco, MD;  Location: [place];  Service: Cardiovascular;  Laterality: N/A;  . CARDIOVERSION N/A 02/05/2011   Procedure: CARDIOVERSION;  Surgeon: Evans Lance, MD;  Location: [facility] CATH LAB;  Service: Cardiovascular;  Laterality: N/A;  . COLONOSCOPY N/A 01/03/2018   Procedure: COLONOSCOPY;  Surgeon: Mauri Pole, MD;  Location: [place] ENDOSCOPY;  Service: Endoscopy;  Laterality: N/A;  . Convergent procedure  11/13/11   afib ablation at [facility]  . EP IMPLANTABLE DEVICE N/A 02/09/2015   Procedure: Loop Recorder Removal;  Surgeon: [name], MD;  Location: [place] CV LAB;  Service: Cardiovascular;  Laterality: N/A;  . ESOPHAGOGASTRODUODENOSCOPY (EGD) WITH PROPOFOL N/A 01/02/2018   Procedure: ESOPHAGOGASTRODUODENOSCOPY (EGD) WITH PROPOFOL;  Surgeon: Mauri Pole, MD;  Location: [place] ENDOSCOPY;  Service: Endoscopy;  Laterality: N/A;  . loop recorder insertion Left 09/2011  . Loop recorder removal  02/09/15   MDT Reveal XT loop recorder removed by Dr Rayann Heman  . POLYPECTOMY  01/03/2018   Procedure: POLYPECTOMY;  Surgeon: Mauri Pole, MD;  Location: [place];  Service: Endoscopy;;  . TEE WITHOUT CARDIOVERSION N/A 09/08/2012   Procedure: TRANSESOPHAGEAL ECHOCARDIOGRAM (TEE);  Surgeon: Fay Records, MD;  Location: [facility] ENDOSCOPY;  Service: Cardiovascular;  Laterality: N/A;  . TONSILLECTOMY    . TONSILLECTOMY    . US ECHOCARDIOGRAPHY  Aug 7322   Normal systolic function with EF 55%  and slight LAE at 4.4cm    Current Outpatient Medications  Medication Sig Dispense Refill  . amphetamine-dextroamphetamine (ADDERALL) 15 MG tablet Take 15 mg by mouth daily as needed (for additional focus).     Marland Kitchen atorvastatin (LIPITOR) 10 MG tablet TAKE 1 TABLET BY MOUTH EVERY DAY 30 tablet 6  . dabigatran (PRADAXA) 150 MG CAPS capsule TAKE 1 CAPSULE (150 MG TOTAL) BY MOUTH 2 (TWO) TIMES DAILY. Stopped in the hospital due to  stomach bleed.  Restart taking it on 01/06/2018.    Marland Kitchen dofetilide (TIKOSYN) 500 MCG capsule Take 1 capsule (500 mcg total) by mouth 2 (two) times daily. 60 capsule 6  . escitalopram (LEXAPRO) 20 MG tablet Take 20 mg by mouth Daily. Lexapro    . metoprolol tartrate (LOPRESSOR) 50 MG tablet TAKE 1 TABLET BY MOUTH TWICE A DAY 60 tablet 6  . OVER THE COUNTER MEDICATION Take 400 mg by mouth 2 (two) times daily. Magnical/D Take 1 tablet by mouth twice a day    . pantoprazole (PROTONIX) 40 MG tablet Take 1 tablet (40 mg total) by mouth 2 (two) times daily before a meal. 60 tablet 1  . potassium chloride (KLOR-CON M10) 10 MEQ tablet Take 1 tablet (10 mEq total) by mouth daily. 30 tablet 3  . rOPINIRole (REQUIP) 1 MG tablet Take 1 mg by mouth at bedtime.      Marland Kitchen VYVANSE 70 MG capsule Take 70 mg by mouth Daily.      No current facility-administered medications for this encounter.     Allergies  Allergen Reactions  . Amiodarone Other (See Comments)    REACTION: dyspnea    Social History   Socioeconomic History  . Marital status: Married    Spouse name: Not on file  . Number of children: Not on file  . Years of education: Not on file  . Highest education level: Not on file  Occupational History  . Not on file  Social Needs  . Financial resource strain: Not on file  . Food insecurity:    Worry: Not on file    Inability: Not on file  . Transportation needs:    Medical: Not on file    Non-medical: Not on file  Tobacco Use  . Smoking status: Never Smoker  . Smokeless tobacco: Never Used  Substance and Sexual Activity  . Alcohol use: No  . Drug use: No  . Sexual activity: Not on file  Lifestyle  . Physical activity:    Days per week: Not on file    Minutes per session: Not on file  . Stress: Not on file  Relationships  . Social connections:    Talks on phone: Not on file    Gets together: Not on file    Attends religious service: Not on file    Active member of club or organization:  Not on file    Attends meetings of clubs or organizations: Not on file    Relationship status: Not on file  . Intimate partner violence:    Fear of current or ex partner: Not on file    Emotionally abused: Not on file    Physically abused: Not on file    Forced sexual activity: Not on file  Other Topics Concern  . Not on file  Social History Narrative   Pt lives in [place] alone.  Works at [employer]    Family History  Problem Relation Age of Onset  . Coronary artery disease Father  ROS- All systems are reviewed and negative except as per the HPI above  Physical Exam: Vitals:   01/13/18 1329  BP: (!) 152/70  Pulse: (!) 52  Weight: 79.4 kg  Height: 5\' 9"  (1.753 m)    GEN- The patient is well appearing, alert and oriented x 3 today.   Head- normocephalic, atraumatic Eyes-  Sclera clear, conjunctiva pink Ears- hearing intact Oropharynx- clear Neck- supple, no JVP Lymph- no cervical lymphadenopathy Lungs- Clear to ausculation bilaterally, normal work of breathing Heart- Regular rate and rhythm, no murmurs, rubs or gallops, PMI not laterally displaced GI- soft, NT, ND, + BS Extremities- no clubbing, cyanosis, or edema MS- no significant deformity or atrophy Skin- no rash or lesion Psych- euthymic mood, full affect Neuro- strength and sensation are intact  EKG- Sinus brady at 52 bpm, pr int 220 ms, qrs int 92 ms, qtc 483 ms Epic records reviewed  Assessment and Plan: 1. Persistent  AFib Maintaining SR with dofetilide, continue 500 mcg bid Continue BB without change  2. Recent upper GI bleed Pt is being seen here for f/u as she does not have another doctor in the system as she has declined recommendations to get established on many previous visits with PCP Avoid antiinflammatories Continue pradaxa 150 mg bid Cbc/bmet today Pt states that she does not have f/u with GI After she left we found a phone notes that a message was left to  make GI appointment as well as [facility] has tried to contact to help assist with PCP and she has not returned their calls   F/u with Dr. Rayann Heman in 3 months      Butch Penny C. Carroll, [facility] [address] [place], [place] [address] [phone]

## 2018-01-14 ENCOUNTER — Encounter (HOSPITAL_COMMUNITY): Payer: Self-pay | Admitting: *Deleted

## 2018-01-14 ENCOUNTER — Encounter: Payer: Self-pay | Admitting: Gastroenterology

## 2018-01-15 ENCOUNTER — Other Ambulatory Visit: Payer: Self-pay | Admitting: *Deleted

## 2018-01-15 NOTE — Patient Outreach (Signed)
[place] [facility]) Care Management  01/15/2018  MARIJA CALAMARI 12-31-1945 [id number]    Transition of Care (3rd unsuccessful) outreach. RN able to leave a HIPAA approved voice message requesting a call back. Will intervene at that time for pending [facility] services. No respond to outreach letter. Will plan to close this case this month if no response.  Raina Mina, RN Care Management Coordinator [place] Office [phone]

## 2018-01-20 ENCOUNTER — Other Ambulatory Visit: Payer: Self-pay | Admitting: *Deleted

## 2018-01-20 NOTE — Patient Outreach (Signed)
[facility] [facility]) Care Management  01/20/2018  Rose Ball 1946/03/03 [id number]    Case will be closed with unsuccessful contacts and no response to out reach letter. Letter will be send to provider noted in Epic for case closure.  Raina Mina, RN Care Management Coordinator [place] Office [phone]

## 2018-03-19 ENCOUNTER — Ambulatory Visit: Payer: PPO | Admitting: Internal Medicine

## 2018-04-11 ENCOUNTER — Other Ambulatory Visit: Payer: Self-pay | Admitting: Nurse Practitioner

## 2018-04-17 ENCOUNTER — Other Ambulatory Visit (HOSPITAL_COMMUNITY): Payer: Self-pay | Admitting: *Deleted

## 2018-04-17 MED ORDER — DABIGATRAN ETEXILATE MESYLATE 150 MG PO CAPS: 150.0000 mg | ORAL_CAPSULE | Freq: Two times a day (BID) | ORAL | 3 refills | Status: DC

## 2018-07-04 ENCOUNTER — Other Ambulatory Visit (HOSPITAL_COMMUNITY): Payer: Self-pay | Admitting: Nurse Practitioner

## 2018-07-11 ENCOUNTER — Other Ambulatory Visit (HOSPITAL_COMMUNITY): Payer: Self-pay | Admitting: Nurse Practitioner

## 2018-07-12 ENCOUNTER — Other Ambulatory Visit: Payer: Self-pay | Admitting: Nurse Practitioner

## 2018-08-02 ENCOUNTER — Other Ambulatory Visit (HOSPITAL_COMMUNITY): Payer: Self-pay | Admitting: Nurse Practitioner

## 2018-08-29 ENCOUNTER — Telehealth (HOSPITAL_COMMUNITY): Payer: Self-pay | Admitting: *Deleted

## 2018-08-29 NOTE — Telephone Encounter (Signed)
Pt past due for Tikosyn follow up.  lft vcml for pt to call back to sched follow up appt

## 2018-09-05 ENCOUNTER — Other Ambulatory Visit (HOSPITAL_COMMUNITY): Payer: Self-pay | Admitting: Nurse Practitioner

## 2018-10-01 ENCOUNTER — Other Ambulatory Visit (HOSPITAL_COMMUNITY): Payer: Self-pay | Admitting: Nurse Practitioner

## 2018-10-02 ENCOUNTER — Other Ambulatory Visit (HOSPITAL_COMMUNITY): Payer: Self-pay | Admitting: Nurse Practitioner

## 2018-10-05 ENCOUNTER — Other Ambulatory Visit: Payer: Self-pay | Admitting: Nurse Practitioner

## 2018-10-14 DIAGNOSIS — D3130 Benign neoplasm of unspecified choroid: Secondary | ICD-10-CM | POA: Diagnosis not present

## 2018-10-15 ENCOUNTER — Other Ambulatory Visit: Payer: Self-pay

## 2018-10-15 ENCOUNTER — Encounter (HOSPITAL_COMMUNITY): Payer: Self-pay | Admitting: Nurse Practitioner

## 2018-10-15 ENCOUNTER — Ambulatory Visit (HOSPITAL_COMMUNITY)
Admission: RE | Admit: 2018-10-15 | Discharge: 2018-10-15 | Disposition: A | Discharge status: Home / Self Care | Payer: PPO | Source: Ambulatory Visit | Attending: Nurse Practitioner | Admitting: Nurse Practitioner

## 2018-10-15 VITALS — BP 132/66 | HR 60 | Ht 69.0 in | Wt 185.0 lb

## 2018-10-15 DIAGNOSIS — I4819 Other persistent atrial fibrillation: Secondary | ICD-10-CM | POA: Diagnosis not present

## 2018-10-15 DIAGNOSIS — F329 Major depressive disorder, single episode, unspecified: Secondary | ICD-10-CM | POA: Diagnosis not present

## 2018-10-15 DIAGNOSIS — E785 Hyperlipidemia, unspecified: Secondary | ICD-10-CM | POA: Diagnosis not present

## 2018-10-15 DIAGNOSIS — E876 Hypokalemia: Secondary | ICD-10-CM | POA: Diagnosis not present

## 2018-10-15 DIAGNOSIS — Z79899 Other long term (current) drug therapy: Secondary | ICD-10-CM | POA: Diagnosis not present

## 2018-10-15 DIAGNOSIS — I10 Essential (primary) hypertension: Secondary | ICD-10-CM | POA: Insufficient documentation

## 2018-10-15 DIAGNOSIS — Z7901 Long term (current) use of anticoagulants: Secondary | ICD-10-CM | POA: Diagnosis not present

## 2018-10-15 LAB — BASIC METABOLIC PANEL
Anion gap: 6 (ref 5–15)
BUN: 25 mg/dL — ABNORMAL HIGH (ref 8–23)
CO2: 27 mmol/L (ref 22–32)
Calcium: 8.9 mg/dL (ref 8.9–10.3)
Chloride: 107 mmol/L (ref 98–111)
Creatinine, Ser: 1.53 mg/dL — ABNORMAL HIGH (ref 0.44–1.00)
GFR calc Af Amer: 39 mL/min — ABNORMAL LOW (ref >60)
GFR calc non Af Amer: 34 mL/min — ABNORMAL LOW (ref >60)
Glucose, Bld: 90 mg/dL (ref 70–99)
Potassium: 4.7 mmol/L (ref 3.5–5.1)
Sodium: 140 mmol/L (ref 135–145)

## 2018-10-15 LAB — CBC WITH DIFFERENTIAL/PLATELET
Abs Immature Granulocytes: 0.03 10*3/uL (ref 0.00–0.07)
Basophils Absolute: 0 10*3/uL (ref 0.0–0.1)
Basophils Relative: 0 %
Eosinophils Absolute: 0.2 10*3/uL (ref 0.0–0.5)
Eosinophils Relative: 3 %
HCT: 42.2 % (ref 36.0–46.0)
Hemoglobin: 13.5 g/dL (ref 12.0–15.0)
Immature Granulocytes: 0 %
Lymphocytes Relative: 27 %
Lymphs Abs: 1.8 10*3/uL (ref 0.7–4.0)
MCH: 27.7 pg (ref 26.0–34.0)
MCHC: 32 g/dL (ref 30.0–36.0)
MCV: 86.5 fL (ref 80.0–100.0)
Monocytes Absolute: 0.7 10*3/uL (ref 0.1–1.0)
Monocytes Relative: 11 %
Neutro Abs: 3.9 10*3/uL (ref 1.7–7.7)
Neutrophils Relative %: 59 %
Platelets: 249 10*3/uL (ref 150–400)
RBC: 4.88 MIL/uL (ref 3.87–5.11)
RDW: 15.1 % (ref 11.5–15.5)
WBC: 6.7 10*3/uL (ref 4.0–10.5)
nRBC: 0 % (ref 0.0–0.2)

## 2018-10-15 LAB — MAGNESIUM: Magnesium: 2.3 mg/dL (ref 1.7–2.4)

## 2018-10-15 NOTE — Progress Notes (Signed)
Patient ID: Rose Ball, female   DOB: 1945/06/04, 73 y.o.   MRN: [id number]     Primary Care Physician: Patient, No Pcp Per Referring Physician: Dr. Nyoka Lint is a 73 y.o. female with a h/o afib, s/p convergent procedure 2013, on tikosyn. She is in the clinic for f/u. She had   GI bleed, last October, was taking naprosyn with pradaxa for low back pain. Had presenting HGB of 6.9, received 3 units of blood. Was found to have a gastric ulcer, endoscopy and colonoscopy was done in the hospital. She was seen f/u by GI but has not had any further f/u of her blood count as she still does not have a PCP.  This in the past has been  stressed to the pt importance of getting established with one. She states that she is working on it.   She denies  feeling   weakness or shortness of breath with exertion as she did with the anemia.. She has remained in SR during this time. No further bleeding issues.   Today, she denies symptoms of palpitations, chest pain,  orthopnea, PND, lower extremity edema, dizziness, presyncope, syncope, or neurologic sequela.  The patient is tolerating medications without difficulties and is otherwise without complaint today.   Past Medical History:  Diagnosis Date  . Attention deficit    on Vyvanse  . Chronic anticoagulation   . Depression   . Dyslipidemia   . Hypertension   . Hypokalemia   . Morbid obesity ([place])   . Palpitations   . Persistent atrial fibrillation 05/27/14 Chadsvasc score of 4   s/p cardioversion Sept 2012; failed   Past Surgical History:  Procedure Laterality Date  . BIOPSY  01/02/2018   Procedure: BIOPSY;  Surgeon: Mauri Pole, MD;  Location: [place];  Service: Endoscopy;;  . BREAST BIOPSY    . CARDIOVERSION  Sept 2012   failed  . CARDIOVERSION N/A 09/08/2012   Procedure: CARDIOVERSION;  Surgeon: Fay Records, MD;  Location: [facility] ENDOSCOPY;  Service: Cardiovascular;  Laterality: N/A;  . CARDIOVERSION N/A 02/15/2013    Procedure: CARDIOVERSION-BEDSIDE;  Surgeon: Darlin Coco, MD;  Location: [place];  Service: Cardiovascular;  Laterality: N/A;  . CARDIOVERSION N/A 02/05/2011   Procedure: CARDIOVERSION;  Surgeon: Evans Lance, MD;  Location: [facility] CATH LAB;  Service: Cardiovascular;  Laterality: N/A;  . COLONOSCOPY N/A 01/03/2018   Procedure: COLONOSCOPY;  Surgeon: Mauri Pole, MD;  Location: [place] ENDOSCOPY;  Service: Endoscopy;  Laterality: N/A;  . Convergent procedure  11/13/11   afib ablation at [facility]  . EP IMPLANTABLE DEVICE N/A 02/09/2015   Procedure: Loop Recorder Removal;  Surgeon: [name], MD;  Location: [place] CV LAB;  Service: Cardiovascular;  Laterality: N/A;  . ESOPHAGOGASTRODUODENOSCOPY (EGD) WITH PROPOFOL N/A 01/02/2018   Procedure: ESOPHAGOGASTRODUODENOSCOPY (EGD) WITH PROPOFOL;  Surgeon: Mauri Pole, MD;  Location: [place] ENDOSCOPY;  Service: Endoscopy;  Laterality: N/A;  . loop recorder insertion Left 09/2011  . Loop recorder removal  02/09/15   MDT Reveal XT loop recorder removed by Dr Rayann Heman  . POLYPECTOMY  01/03/2018   Procedure: POLYPECTOMY;  Surgeon: Mauri Pole, MD;  Location: [place];  Service: Endoscopy;;  . TEE WITHOUT CARDIOVERSION N/A 09/08/2012   Procedure: TRANSESOPHAGEAL ECHOCARDIOGRAM (TEE);  Surgeon: Fay Records, MD;  Location: [facility] ENDOSCOPY;  Service: Cardiovascular;  Laterality: N/A;  . TONSILLECTOMY    . TONSILLECTOMY    . US ECHOCARDIOGRAPHY  Aug 2012  Normal systolic function with EF 55% and slight LAE at 4.4cm    Current Outpatient Medications  Medication Sig Dispense Refill  . amphetamine-dextroamphetamine (ADDERALL) 15 MG tablet Take 15 mg by mouth daily as needed (for additional focus).     Marland Kitchen atorvastatin (LIPITOR) 10 MG tablet TAKE 1 TABLET BY MOUTH EVERY DAY 90 tablet 2  . dofetilide (TIKOSYN) 500 MCG capsule Take 1 capsule (500 mcg total) by mouth 2 (two) times daily. appt required for refills [phone] 180 capsule 0  . escitalopram  (LEXAPRO) 20 MG tablet Take 20 mg by mouth Daily. Lexapro    . KLOR-CON M10 10 MEQ tablet TAKE 1 TABLET BY MOUTH EVERY DAY 90 tablet 1  . metoprolol tartrate (LOPRESSOR) 50 MG tablet TAKE 1 TABLET BY MOUTH TWICE A DAY 180 tablet 2  . OVER THE COUNTER MEDICATION Take 400 mg by mouth 2 (two) times daily. Magnical/D Take 1 tablet by mouth twice a day    . pantoprazole (PROTONIX) 40 MG tablet Take 1 tablet (40 mg total) by mouth 2 (two) times daily before a meal. 60 tablet 1  . PRADAXA 150 MG CAPS capsule TAKE 1 CAPSULE (150 MG TOTAL) BY MOUTH 2 (TWO) TIMES DAILY. APPT REQUIRED FOR REFILLS [phone] 60 capsule 0  . rOPINIRole (REQUIP) 1 MG tablet Take 1 mg by mouth at bedtime.      Marland Kitchen VYVANSE 70 MG capsule Take 70 mg by mouth Daily.      No current facility-administered medications for this encounter.     Allergies  Allergen Reactions  . Amiodarone Other (See Comments)    REACTION: dyspnea    Social History   Socioeconomic History  . Marital status: Married    Spouse name: Not on file  . Number of children: Not on file  . Years of education: Not on file  . Highest education level: Not on file  Occupational History  . Not on file  Social Needs  . Financial resource strain: Not on file  . Food insecurity    Worry: Not on file    Inability: Not on file  . Transportation needs    Medical: Not on file    Non-medical: Not on file  Tobacco Use  . Smoking status: Never Smoker  . Smokeless tobacco: Never Used  Substance and Sexual Activity  . Alcohol use: No  . Drug use: No  . Sexual activity: Not on file  Lifestyle  . Physical activity    Days per week: Not on file    Minutes per session: Not on file  . Stress: Not on file  Relationships  . Social Herbalist on phone: Not on file    Gets together: Not on file    Attends religious service: Not on file    Active member of club or organization: Not on file    Attends meetings of clubs or organizations: Not on file     Relationship status: Not on file  . Intimate partner violence    Fear of current or ex partner: Not on file    Emotionally abused: Not on file    Physically abused: Not on file    Forced sexual activity: Not on file  Other Topics Concern  . Not on file  Social History Narrative   Pt lives in [place] alone.  Works at [employer]    Family History  Problem Relation Age of Onset  . Coronary artery disease Father  ROS- All systems are reviewed and negative except as per the HPI above  Physical Exam: Vitals:   10/15/18 1544  BP: 132/66  Pulse: 60  Weight: 83.9 kg  Height: 5\' 9"  (1.753 m)    GEN- The patient is well appearing, alert and oriented x 3 today.   Head- normocephalic, atraumatic Eyes-  Sclera clear, conjunctiva pink Ears- hearing intact Oropharynx- clear Neck- supple, no JVP Lymph- no cervical lymphadenopathy Lungs- Clear to ausculation bilaterally, normal work of breathing Heart- Regular rate and rhythm, no murmurs, rubs or gallops, PMI not laterally displaced GI- soft, NT, ND, + BS Extremities- no clubbing, cyanosis, or edema MS- no significant deformity or atrophy Skin- no rash or lesion Psych- euthymic mood, full affect Neuro- strength and sensation are intact  EKG- Sinus rhythm with first degree AV block, pr int 218 ms, qrs iny 86 ms, qtc 456 ms Epic records reviewed  Assessment and Plan: 1. Persistent  AFib Maintaining SR with dofetilide, continue 500 mcg bid Continue BB without change Bmet/mag today  2. H/o upper GI bleed Avoid antiinflammatories Continue pradaxa 150 mg bid Cbc today  She was still encouraged to establish with a PCP  F/u with afib clinic in 6 months       Butch Penny C. Soleia Badolato, [facility] [address] [place], [place] [address] [phone]

## 2018-10-22 ENCOUNTER — Other Ambulatory Visit: Payer: Self-pay | Admitting: Nurse Practitioner

## 2018-11-05 ENCOUNTER — Other Ambulatory Visit (HOSPITAL_COMMUNITY): Payer: Self-pay | Admitting: Nurse Practitioner

## 2018-11-12 ENCOUNTER — Other Ambulatory Visit (HOSPITAL_COMMUNITY): Payer: Self-pay | Admitting: *Deleted

## 2018-11-12 MED ORDER — DABIGATRAN ETEXILATE MESYLATE 150 MG PO CAPS: 150.0000 mg | ORAL_CAPSULE | Freq: Two times a day (BID) | ORAL | 1 refills | Status: DC

## 2019-01-15 ENCOUNTER — Other Ambulatory Visit (HOSPITAL_COMMUNITY): Payer: Self-pay | Admitting: Nurse Practitioner

## 2019-04-15 ENCOUNTER — Other Ambulatory Visit: Payer: Self-pay | Admitting: Nurse Practitioner

## 2019-04-20 ENCOUNTER — Other Ambulatory Visit: Payer: Self-pay | Admitting: Nurse Practitioner

## 2019-05-11 ENCOUNTER — Ambulatory Visit: Payer: PPO | Attending: Internal Medicine

## 2019-05-11 ENCOUNTER — Other Ambulatory Visit (HOSPITAL_COMMUNITY): Payer: Self-pay | Admitting: *Deleted

## 2019-05-11 DIAGNOSIS — Z23 Encounter for immunization: Secondary | ICD-10-CM

## 2019-05-11 MED ORDER — DOFETILIDE 500 MCG PO CAPS: 500.0000 ug | ORAL_CAPSULE | Freq: Two times a day (BID) | ORAL | 0 refills | Status: DC

## 2019-05-11 NOTE — Progress Notes (Signed)
   Covid-19 Vaccination Clinic  Name:  Rose Ball    MRN: [id number] DOB: 1945/04/06  05/11/2019  Rose Ball was observed post Covid-19 immunization for 15 minutes without incidence. She was provided with Vaccine Information Sheet and instruction to access the V-Safe system.   Rose Ball was instructed to call 911 with any severe reactions post vaccine: Marland Kitchen Difficulty breathing  . Swelling of your face and throat  . A fast heartbeat  . A bad rash all over your body  . Dizziness and weakness    Immunizations Administered    Name Date Dose VIS Date Route   Pfizer COVID-19 Vaccine 05/11/2019  3:03 PM 0.3 mL 03/13/2019 Intramuscular   Manufacturer: [place]   Lot: [id number]   [place]: [id number]

## 2019-05-12 ENCOUNTER — Other Ambulatory Visit: Payer: Self-pay | Admitting: Nurse Practitioner

## 2019-05-12 ENCOUNTER — Other Ambulatory Visit (HOSPITAL_COMMUNITY): Payer: Self-pay | Admitting: Nurse Practitioner

## 2019-05-18 ENCOUNTER — Other Ambulatory Visit: Payer: Self-pay | Admitting: Nurse Practitioner

## 2019-05-19 ENCOUNTER — Encounter (HOSPITAL_COMMUNITY): Payer: Self-pay | Admitting: Nurse Practitioner

## 2019-05-19 ENCOUNTER — Other Ambulatory Visit: Payer: Self-pay

## 2019-05-19 ENCOUNTER — Ambulatory Visit (HOSPITAL_COMMUNITY)
Admission: RE | Admit: 2019-05-19 | Discharge: 2019-05-19 | Disposition: A | Discharge status: Home / Self Care | Payer: PPO | Source: Ambulatory Visit | Attending: Nurse Practitioner | Admitting: Nurse Practitioner

## 2019-05-19 VITALS — BP 138/78 | HR 67 | Ht 69.0 in | Wt 202.4 lb

## 2019-05-19 DIAGNOSIS — D6869 Other thrombophilia: Secondary | ICD-10-CM

## 2019-05-19 DIAGNOSIS — I4819 Other persistent atrial fibrillation: Secondary | ICD-10-CM | POA: Insufficient documentation

## 2019-05-19 DIAGNOSIS — I10 Essential (primary) hypertension: Secondary | ICD-10-CM | POA: Insufficient documentation

## 2019-05-19 DIAGNOSIS — E785 Hyperlipidemia, unspecified: Secondary | ICD-10-CM | POA: Insufficient documentation

## 2019-05-19 DIAGNOSIS — Z888 Allergy status to other drugs, medicaments and biological substances status: Secondary | ICD-10-CM | POA: Diagnosis not present

## 2019-05-19 DIAGNOSIS — Z8249 Family history of ischemic heart disease and other diseases of the circulatory system: Secondary | ICD-10-CM | POA: Diagnosis not present

## 2019-05-19 DIAGNOSIS — Z7901 Long term (current) use of anticoagulants: Secondary | ICD-10-CM | POA: Insufficient documentation

## 2019-05-19 DIAGNOSIS — Z79899 Other long term (current) drug therapy: Secondary | ICD-10-CM | POA: Insufficient documentation

## 2019-05-19 DIAGNOSIS — Z8711 Personal history of peptic ulcer disease: Secondary | ICD-10-CM | POA: Insufficient documentation

## 2019-05-19 DIAGNOSIS — F909 Attention-deficit hyperactivity disorder, unspecified type: Secondary | ICD-10-CM | POA: Insufficient documentation

## 2019-05-20 ENCOUNTER — Encounter (HOSPITAL_COMMUNITY): Payer: Self-pay | Admitting: Nurse Practitioner

## 2019-05-20 NOTE — Progress Notes (Signed)
Patient ID: Rose Ball, female   DOB: 1945-11-02, 74 y.o.   MRN: [id number]     Primary Care Physician: Patient, No Pcp Per Referring Physician: Dr. Nyoka Lint is a 74 y.o. female with a h/o afib, s/p convergent procedure 2013, on tikosyn. She is in the clinic for f/u. She had   GI bleed, last October, was taking naprosyn with pradaxa for low back pain. Had presenting HGB of 6.9, received 3 units of blood. Was found to have a gastric ulcer, endoscopy and colonoscopy was done in the hospital. She was seen f/u by GI but has not had any further f/u of her blood count as she still does not have a PCP.  This in the past has been  stressed to the pt importance of getting established with one. She states that she is working on it.   She denies  feeling   weakness or shortness of breath with exertion as she did with the anemia.. She has remained in SR during this time. No further bleeding issues.   F/u in afib clinic, 05/19/19. Pt is here for Tikosyn surveillance. Reports no afib. Feels well. Still has not established with a PCP.   Today, she denies symptoms of palpitations, chest pain,  orthopnea, PND, lower extremity edema, dizziness, presyncope, syncope, or neurologic sequela.  The patient is tolerating medications without difficulties and is otherwise without complaint today.   Past Medical History:  Diagnosis Date  . Attention deficit    on Vyvanse  . Chronic anticoagulation   . Depression   . Dyslipidemia   . Hypertension   . Hypokalemia   . Morbid obesity ([place])   . Palpitations   . Persistent atrial fibrillation ([place]) 05/27/14 Chadsvasc score of 4   s/p cardioversion Sept 2012; failed   Past Surgical History:  Procedure Laterality Date  . BIOPSY  01/02/2018   Procedure: BIOPSY;  Surgeon: Mauri Pole, MD;  Location: [place];  Service: Endoscopy;;  . BREAST BIOPSY    . CARDIOVERSION  Sept 2012   failed  . CARDIOVERSION N/A 09/08/2012   Procedure:  CARDIOVERSION;  Surgeon: Fay Records, MD;  Location: [facility] ENDOSCOPY;  Service: Cardiovascular;  Laterality: N/A;  . CARDIOVERSION N/A 02/15/2013   Procedure: CARDIOVERSION-BEDSIDE;  Surgeon: Darlin Coco, MD;  Location: [place];  Service: Cardiovascular;  Laterality: N/A;  . CARDIOVERSION N/A 02/05/2011   Procedure: CARDIOVERSION;  Surgeon: Evans Lance, MD;  Location: [facility] CATH LAB;  Service: Cardiovascular;  Laterality: N/A;  . COLONOSCOPY N/A 01/03/2018   Procedure: COLONOSCOPY;  Surgeon: Mauri Pole, MD;  Location: [place] ENDOSCOPY;  Service: Endoscopy;  Laterality: N/A;  . Convergent procedure  11/13/11   afib ablation at [facility]  . EP IMPLANTABLE DEVICE N/A 02/09/2015   Procedure: Loop Recorder Removal;  Surgeon: [name], MD;  Location: [place] CV LAB;  Service: Cardiovascular;  Laterality: N/A;  . ESOPHAGOGASTRODUODENOSCOPY (EGD) WITH PROPOFOL N/A 01/02/2018   Procedure: ESOPHAGOGASTRODUODENOSCOPY (EGD) WITH PROPOFOL;  Surgeon: Mauri Pole, MD;  Location: [place] ENDOSCOPY;  Service: Endoscopy;  Laterality: N/A;  . loop recorder insertion Left 09/2011  . Loop recorder removal  02/09/15   MDT Reveal XT loop recorder removed by Dr Rayann Heman  . POLYPECTOMY  01/03/2018   Procedure: POLYPECTOMY;  Surgeon: Mauri Pole, MD;  Location: [place];  Service: Endoscopy;;  . TEE WITHOUT CARDIOVERSION N/A 09/08/2012   Procedure: TRANSESOPHAGEAL ECHOCARDIOGRAM (TEE);  Surgeon: Fay Records, MD;  Location: [facility]  ENDOSCOPY;  Service: Cardiovascular;  Laterality: N/A;  . TONSILLECTOMY    . TONSILLECTOMY    . US ECHOCARDIOGRAPHY  Aug 0000000   Normal systolic function with EF 55% and slight LAE at 4.4cm    Current Outpatient Medications  Medication Sig Dispense Refill  . amphetamine-dextroamphetamine (ADDERALL) 15 MG tablet Take 15 mg by mouth daily as needed (for additional focus).     Marland Kitchen atorvastatin (LIPITOR) 10 MG tablet TAKE 1 TABLET (10 MG TOTAL) BY MOUTH DAILY. APPOINTMENT REQUIRED FOR  FURTHER REFILLS [phone] 30 tablet 0  . dofetilide (TIKOSYN) 500 MCG capsule Take 1 capsule (500 mcg total) by mouth 2 (two) times daily. appt required for refills [phone] 60 capsule 0  . escitalopram (LEXAPRO) 20 MG tablet Take 20 mg by mouth Daily. Lexapro    . KLOR-CON M10 10 MEQ tablet TAKE 1 TABLET (10 MEQ TOTAL) BY MOUTH DAILY. APPOINTMENT REQUIRED FOR FURTHER REFILLS [phone] 30 tablet 0  . metoprolol tartrate (LOPRESSOR) 50 MG tablet TAKE 1 TABLET (50 MG TOTAL) BY MOUTH 2 (TWO) TIMES DAILY. APPOINTMENT REQUIRED FOR FURTHER REFILLS [phone] 60 tablet 0  . OVER THE COUNTER MEDICATION Take 400 mg by mouth 2 (two) times daily. Magnical/D Take 1 tablet by mouth twice a day    . PRADAXA 150 MG CAPS capsule TAKE 1 CAPSULE BY MOUTH TWICE A DAY 180 capsule 1  . rOPINIRole (REQUIP) 1 MG tablet Take 1 mg by mouth at bedtime.      Marland Kitchen VYVANSE 70 MG capsule Take 70 mg by mouth Daily.      No current facility-administered medications for this encounter.    Allergies  Allergen Reactions  . Amiodarone Other (See Comments)    REACTION: dyspnea    Social History   Socioeconomic History  . Marital status: Married    Spouse name: Not on file  . Number of children: Not on file  . Years of education: Not on file  . Highest education level: Not on file  Occupational History  . Not on file  Tobacco Use  . Smoking status: Never Smoker  . Smokeless tobacco: Never Used  Substance and Sexual Activity  . Alcohol use: No  . Drug use: No  . Sexual activity: Not on file  Other Topics Concern  . Not on file  Social History Narrative   Pt lives in [place] alone.  Works at [employer] Strain:   . Difficulty of Paying Living Expenses: Not on file  Food Insecurity:   . Worried About Charity fundraiser in the Last Year: Not on file  . Ran Out of Food in the Last Year: Not on file  Transportation Needs:     . Lack of Transportation (Medical): Not on file  . Lack of Transportation (Non-Medical): Not on file  Physical Activity:   . Days of Exercise per Week: Not on file  . Minutes of Exercise per Session: Not on file  Stress:   . Feeling of Stress : Not on file  Social Connections:   . Frequency of Communication with Friends and Family: Not on file  . Frequency of Social Gatherings with Friends and Family: Not on file  . Attends Religious Services: Not on file  . Active Member of Clubs or Organizations: Not on file  . Attends Archivist Meetings: Not on file  . Marital Status: Not on file  Intimate Partner Violence:   .  Fear of Current or Ex-Partner: Not on file  . Emotionally Abused: Not on file  . Physically Abused: Not on file  . Sexually Abused: Not on file    Family History  Problem Relation Age of Onset  . Coronary artery disease Father     ROS- All systems are reviewed and negative except as per the HPI above  Physical Exam: Vitals:   05/19/19 1513  BP: 138/78  Pulse: 67  Weight: 91.8 kg  Height: 5\' 9"  (1.753 m)    GEN- The patient is well appearing, alert and oriented x 3 today.   Head- normocephalic, atraumatic Eyes-  Sclera clear, conjunctiva pink Ears- hearing intact Oropharynx- clear Neck- supple, no JVP Lymph- no cervical lymphadenopathy Lungs- Clear to ausculation bilaterally, normal work of breathing Heart- Regular rate and rhythm, no murmurs, rubs or gallops, PMI not laterally displaced GI- soft, NT, ND, + BS Extremities- no clubbing, cyanosis, or edema MS- no significant deformity or atrophy Skin- no rash or lesion Psych- euthymic mood, full affect Neuro- strength and sensation are intact  EKG- Sinus rhythm with first degree AV block, pr int 218 ms, qrs iny 86 ms, qtc 456 ms Epic records reviewed  Assessment and Plan: 1. Persistent  AFib Maintaining SR with dofetilide, continue 500 mcg bid, first degree AV block  Continue metoprolol  tartrate 50 mg bid   2. H/o upper GI bleed Resolved  Avoid antiinflammatories Continue pradaxa 150 mg bid  She will need to have liver/lipid drawn fasting so will have pt to come back in two weeks for this with bmet/mag  She was still encouraged to establish with a PCP  F/u with afib clinic in 6 months       Butch Penny C. Dalisa Forrer, [facility] [address] [place],  [address] [phone]

## 2019-06-02 ENCOUNTER — Other Ambulatory Visit: Payer: Self-pay

## 2019-06-02 ENCOUNTER — Ambulatory Visit (HOSPITAL_COMMUNITY)
Admission: RE | Admit: 2019-06-02 | Discharge: 2019-06-02 | Disposition: A | Discharge status: Home / Self Care | Payer: PPO | Source: Ambulatory Visit | Attending: Nurse Practitioner | Admitting: Nurse Practitioner

## 2019-06-02 DIAGNOSIS — I4819 Other persistent atrial fibrillation: Secondary | ICD-10-CM | POA: Insufficient documentation

## 2019-06-02 DIAGNOSIS — Z7902 Long term (current) use of antithrombotics/antiplatelets: Secondary | ICD-10-CM | POA: Insufficient documentation

## 2019-06-02 DIAGNOSIS — Z79899 Other long term (current) drug therapy: Secondary | ICD-10-CM | POA: Diagnosis not present

## 2019-06-02 LAB — COMPREHENSIVE METABOLIC PANEL
ALT: 15 U/L (ref 0–44)
AST: 22 U/L (ref 15–41)
Albumin: 3.6 g/dL (ref 3.5–5.0)
Alkaline Phosphatase: 82 U/L (ref 38–126)
Anion gap: 7 (ref 5–15)
BUN: 30 mg/dL — ABNORMAL HIGH (ref 8–23)
CO2: 28 mmol/L (ref 22–32)
Calcium: 8.9 mg/dL (ref 8.9–10.3)
Chloride: 105 mmol/L (ref 98–111)
Creatinine, Ser: 1.65 mg/dL — ABNORMAL HIGH (ref 0.44–1.00)
GFR calc Af Amer: 36 mL/min — ABNORMAL LOW (ref >60)
GFR calc non Af Amer: 31 mL/min — ABNORMAL LOW (ref >60)
Glucose, Bld: 98 mg/dL (ref 70–99)
Potassium: 5.2 mmol/L — ABNORMAL HIGH (ref 3.5–5.1)
Sodium: 140 mmol/L (ref 135–145)
Total Bilirubin: 0.4 mg/dL (ref 0.3–1.2)
Total Protein: 6.7 g/dL (ref 6.5–8.1)

## 2019-06-02 LAB — LIPID PANEL
Cholesterol: 190 mg/dL (ref 0–200)
HDL: 54 mg/dL (ref >40)
LDL Cholesterol: 117 mg/dL — ABNORMAL HIGH (ref 0–99)
Total CHOL/HDL Ratio: 3.5 RATIO
Triglycerides: 96 mg/dL (ref <150)
VLDL: 19 mg/dL (ref 0–40)

## 2019-06-02 LAB — MAGNESIUM: Magnesium: 2.2 mg/dL (ref 1.7–2.4)

## 2019-06-04 ENCOUNTER — Other Ambulatory Visit: Payer: Self-pay | Admitting: Nurse Practitioner

## 2019-06-05 ENCOUNTER — Ambulatory Visit: Payer: PPO | Attending: Internal Medicine

## 2019-06-05 DIAGNOSIS — Z23 Encounter for immunization: Secondary | ICD-10-CM | POA: Insufficient documentation

## 2019-06-05 NOTE — Progress Notes (Signed)
   Covid-19 Vaccination Clinic  Name:  Rose Ball    MRN: [id number] DOB: [birth year]  06/05/2019  [name] was observed post Covid-19 immunization for 15 minutes without incident. She was provided with Vaccine Information Sheet and instruction to access the V-Safe system.   [name] was instructed to call 911 with any severe reactions post vaccine: Marland Kitchen Difficulty breathing  . Swelling of face and throat  . A fast heartbeat  . A bad rash all over body  . Dizziness and weakness   Immunizations Administered    Name Date Dose VIS Date Route   Pfizer COVID-19 Vaccine 06/05/2019  3:05 AM 0.3 mL 03/13/2019 Intramuscular   Manufacturer: [place]   Lot: [id number]   [place]: [id number]

## 2019-06-08 ENCOUNTER — Encounter (HOSPITAL_COMMUNITY): Payer: Self-pay | Admitting: *Deleted

## 2019-06-15 ENCOUNTER — Other Ambulatory Visit: Payer: Self-pay | Admitting: Nurse Practitioner

## 2019-07-08 ENCOUNTER — Other Ambulatory Visit (HOSPITAL_COMMUNITY): Payer: Self-pay | Admitting: Nurse Practitioner

## 2019-10-02 ENCOUNTER — Other Ambulatory Visit (HOSPITAL_COMMUNITY): Payer: Self-pay | Admitting: Nurse Practitioner

## 2019-11-03 ENCOUNTER — Other Ambulatory Visit (HOSPITAL_COMMUNITY): Payer: Self-pay | Admitting: Nurse Practitioner

## 2019-12-02 ENCOUNTER — Other Ambulatory Visit (HOSPITAL_COMMUNITY): Payer: Self-pay | Admitting: Nurse Practitioner

## 2019-12-02 ENCOUNTER — Telehealth (HOSPITAL_COMMUNITY): Payer: Self-pay

## 2019-12-02 NOTE — Telephone Encounter (Signed)
Received an refill request for Pradaxa reached out to patient to schedule appointment. Left voicemail.

## 2019-12-06 ENCOUNTER — Other Ambulatory Visit: Payer: Self-pay | Admitting: Nurse Practitioner

## 2019-12-08 ENCOUNTER — Other Ambulatory Visit: Payer: Self-pay | Admitting: Nurse Practitioner

## 2019-12-14 ENCOUNTER — Other Ambulatory Visit (HOSPITAL_COMMUNITY): Payer: Self-pay | Admitting: *Deleted

## 2019-12-14 ENCOUNTER — Other Ambulatory Visit: Payer: Self-pay | Admitting: Nurse Practitioner

## 2019-12-14 MED ORDER — ATORVASTATIN CALCIUM 10 MG PO TABS: 10.0000 mg | ORAL_TABLET | Freq: Every day | ORAL | 0 refills | Status: DC

## 2019-12-14 MED ORDER — METOPROLOL TARTRATE 50 MG PO TABS: 50.0000 mg | ORAL_TABLET | Freq: Two times a day (BID) | ORAL | 0 refills | Status: DC

## 2019-12-14 MED ORDER — DABIGATRAN ETEXILATE MESYLATE 150 MG PO CAPS: ORAL_CAPSULE | ORAL | 0 refills | Status: DC

## 2019-12-14 MED ORDER — POTASSIUM CHLORIDE CRYS ER 10 MEQ PO TBCR: 10.0000 meq | EXTENDED_RELEASE_TABLET | Freq: Every day | ORAL | 0 refills | Status: DC

## 2019-12-24 ENCOUNTER — Ambulatory Visit (HOSPITAL_COMMUNITY)
Admission: RE | Admit: 2019-12-24 | Discharge: 2019-12-24 | Disposition: A | Discharge status: Home / Self Care | Payer: PPO | Source: Ambulatory Visit | Attending: Nurse Practitioner | Admitting: Nurse Practitioner

## 2019-12-24 ENCOUNTER — Other Ambulatory Visit: Payer: Self-pay

## 2019-12-24 ENCOUNTER — Other Ambulatory Visit (HOSPITAL_COMMUNITY): Payer: Self-pay | Admitting: *Deleted

## 2019-12-24 ENCOUNTER — Encounter (HOSPITAL_COMMUNITY): Payer: Self-pay | Admitting: Nurse Practitioner

## 2019-12-24 ENCOUNTER — Ambulatory Visit (HOSPITAL_COMMUNITY): Payer: PPO | Admitting: Nurse Practitioner

## 2019-12-24 VITALS — BP 186/80 | HR 50 | Ht 69.0 in | Wt 216.0 lb

## 2019-12-24 DIAGNOSIS — I10 Essential (primary) hypertension: Secondary | ICD-10-CM | POA: Diagnosis not present

## 2019-12-24 DIAGNOSIS — E785 Hyperlipidemia, unspecified: Secondary | ICD-10-CM | POA: Diagnosis not present

## 2019-12-24 DIAGNOSIS — Z7901 Long term (current) use of anticoagulants: Secondary | ICD-10-CM | POA: Diagnosis not present

## 2019-12-24 DIAGNOSIS — D6869 Other thrombophilia: Secondary | ICD-10-CM | POA: Diagnosis not present

## 2019-12-24 DIAGNOSIS — I4819 Other persistent atrial fibrillation: Secondary | ICD-10-CM

## 2019-12-24 DIAGNOSIS — Z79899 Other long term (current) drug therapy: Secondary | ICD-10-CM | POA: Diagnosis not present

## 2019-12-24 DIAGNOSIS — I4430 Unspecified atrioventricular block: Secondary | ICD-10-CM | POA: Insufficient documentation

## 2019-12-24 LAB — COMPREHENSIVE METABOLIC PANEL
ALT: 15 U/L (ref 0–44)
AST: 23 U/L (ref 15–41)
Albumin: 3.9 g/dL (ref 3.5–5.0)
Alkaline Phosphatase: 87 U/L (ref 38–126)
Anion gap: 9 (ref 5–15)
BUN: 23 mg/dL (ref 8–23)
CO2: 26 mmol/L (ref 22–32)
Calcium: 9.3 mg/dL (ref 8.9–10.3)
Chloride: 106 mmol/L (ref 98–111)
Creatinine, Ser: 1.5 mg/dL — ABNORMAL HIGH (ref 0.44–1.00)
GFR calc Af Amer: 40 mL/min — ABNORMAL LOW (ref >60)
GFR calc non Af Amer: 34 mL/min — ABNORMAL LOW (ref >60)
Glucose, Bld: 99 mg/dL (ref 70–99)
Potassium: 5.3 mmol/L — ABNORMAL HIGH (ref 3.5–5.1)
Sodium: 141 mmol/L (ref 135–145)
Total Bilirubin: 0.7 mg/dL (ref 0.3–1.2)
Total Protein: 7.2 g/dL (ref 6.5–8.1)

## 2019-12-24 LAB — LIPID PANEL
Cholesterol: 157 mg/dL (ref 0–200)
HDL: 55 mg/dL (ref >40)
LDL Cholesterol: 88 mg/dL (ref 0–99)
Total CHOL/HDL Ratio: 2.9 RATIO
Triglycerides: 70 mg/dL (ref <150)
VLDL: 14 mg/dL (ref 0–40)

## 2019-12-24 LAB — CBC
HCT: 44.7 % (ref 36.0–46.0)
Hemoglobin: 14.2 g/dL (ref 12.0–15.0)
MCH: 29 pg (ref 26.0–34.0)
MCHC: 31.8 g/dL (ref 30.0–36.0)
MCV: 91.4 fL (ref 80.0–100.0)
Platelets: 256 10*3/uL (ref 150–400)
RBC: 4.89 MIL/uL (ref 3.87–5.11)
RDW: 13.2 % (ref 11.5–15.5)
WBC: 7.4 10*3/uL (ref 4.0–10.5)
nRBC: 0 % (ref 0.0–0.2)

## 2019-12-24 LAB — MAGNESIUM: Magnesium: 2.3 mg/dL (ref 1.7–2.4)

## 2019-12-24 MED ORDER — DOFETILIDE 500 MCG PO CAPS: 500.0000 ug | ORAL_CAPSULE | Freq: Two times a day (BID) | ORAL | 1 refills | Status: DC

## 2019-12-24 MED ORDER — ATORVASTATIN CALCIUM 10 MG PO TABS
10.0000 mg | ORAL_TABLET | Freq: Every day | ORAL | 6 refills | Status: DC
Start: 2019-12-24 — End: 2020-06-13

## 2019-12-24 MED ORDER — METOPROLOL TARTRATE 50 MG PO TABS
50.0000 mg | ORAL_TABLET | Freq: Two times a day (BID) | ORAL | 6 refills | Status: DC
Start: 2019-12-24 — End: 2020-07-08

## 2019-12-24 MED ORDER — DABIGATRAN ETEXILATE MESYLATE 150 MG PO CAPS: ORAL_CAPSULE | ORAL | 6 refills | Status: DC

## 2019-12-24 NOTE — Progress Notes (Signed)
Patient ID: Rose Ball, female   DOB: April 03, 1945, 74 y.o.   MRN: [id number]     Primary Care Physician: Patient, No Pcp Per Referring Physician: Dr. Nyoka Lint is a 74 y.o. female with a h/o afib, s/p convergent procedure 2013, on tikosyn. She is in the clinic for f/u. She had   GI bleed, last October, was taking naprosyn with pradaxa for low back pain. Had presenting HGB of 6.9, received 3 units of blood. Was found to have a gastric ulcer, endoscopy and colonoscopy was done in the hospital. She was seen f/u by GI but has not had any further f/u of her blood count as she still does not have a PCP.  This in the past has been  stressed to the pt importance of getting established with one. She states that she is working on it.   She denies  feeling   weakness or shortness of breath with exertion as she did with the anemia.. She has remained in SR during this time. No further bleeding issues.   F/u in afib clinic, 05/19/19. Pt is here for Tikosyn surveillance. Reports no afib. Feels well. Still has not established with a PCP.   F/u in afib clinic, 12/24/19. Does not report any  afib. Compliant with Tikosyn and Pradaxa with a CHA2DS2VASc score of 4. Fasting blood work to f/u on refill of Lipitor as she has not established with a PCP. BP is elevated today. States she took BB just minutes ago in her car. No concerns today.   Today, she denies symptoms of palpitations, chest pain,  orthopnea, PND, lower extremity edema, dizziness, presyncope, syncope, or neurologic sequela.  The patient is tolerating medications without difficulties and is otherwise without complaint today.   Past Medical History:  Diagnosis Date  . Attention deficit    on Vyvanse  . Chronic anticoagulation   . Depression   . Dyslipidemia   . Hypertension   . Hypokalemia   . Morbid obesity ([place])   . Palpitations   . Persistent atrial fibrillation ([place]) 05/27/14 Chadsvasc score of 4   s/p cardioversion Sept  2012; failed   Past Surgical History:  Procedure Laterality Date  . BIOPSY  01/02/2018   Procedure: BIOPSY;  Surgeon: Mauri Pole, MD;  Location: [place];  Service: Endoscopy;;  . BREAST BIOPSY    . CARDIOVERSION  Sept 2012   failed  . CARDIOVERSION N/A 09/08/2012   Procedure: CARDIOVERSION;  Surgeon: Fay Records, MD;  Location: [facility] ENDOSCOPY;  Service: Cardiovascular;  Laterality: N/A;  . CARDIOVERSION N/A 02/15/2013   Procedure: CARDIOVERSION-BEDSIDE;  Surgeon: Darlin Coco, MD;  Location: [place];  Service: Cardiovascular;  Laterality: N/A;  . CARDIOVERSION N/A 02/05/2011   Procedure: CARDIOVERSION;  Surgeon: Evans Lance, MD;  Location: [facility] CATH LAB;  Service: Cardiovascular;  Laterality: N/A;  . COLONOSCOPY N/A 01/03/2018   Procedure: COLONOSCOPY;  Surgeon: Mauri Pole, MD;  Location: [place] ENDOSCOPY;  Service: Endoscopy;  Laterality: N/A;  . Convergent procedure  11/13/11   afib ablation at [facility]  . EP IMPLANTABLE DEVICE N/A 02/09/2015   Procedure: Loop Recorder Removal;  Surgeon: [name], MD;  Location: [place] CV LAB;  Service: Cardiovascular;  Laterality: N/A;  . ESOPHAGOGASTRODUODENOSCOPY (EGD) WITH PROPOFOL N/A 01/02/2018   Procedure: ESOPHAGOGASTRODUODENOSCOPY (EGD) WITH PROPOFOL;  Surgeon: Mauri Pole, MD;  Location: [place] ENDOSCOPY;  Service: Endoscopy;  Laterality: N/A;  . loop recorder insertion Left 09/2011  . Loop recorder  removal  02/09/15   MDT Reveal XT loop recorder removed by Dr Rayann Heman  . POLYPECTOMY  01/03/2018   Procedure: POLYPECTOMY;  Surgeon: Mauri Pole, MD;  Location: [place];  Service: Endoscopy;;  . TEE WITHOUT CARDIOVERSION N/A 09/08/2012   Procedure: TRANSESOPHAGEAL ECHOCARDIOGRAM (TEE);  Surgeon: Fay Records, MD;  Location: [facility] ENDOSCOPY;  Service: Cardiovascular;  Laterality: N/A;  . TONSILLECTOMY    . TONSILLECTOMY    . US ECHOCARDIOGRAPHY  Aug 3016   Normal systolic function with EF 55% and slight LAE at 4.4cm     Current Outpatient Medications  Medication Sig Dispense Refill  . amphetamine-dextroamphetamine (ADDERALL) 15 MG tablet Take 15 mg by mouth daily as needed (for additional focus).     Marland Kitchen atorvastatin (LIPITOR) 10 MG tablet TAKE 1 TABLET BY MOUTH EVERY DAY 30 tablet 0  . dabigatran (PRADAXA) 150 MG CAPS capsule TAKE 1 CAPSULE (150 MG TOTAL) BY MOUTH 2 (TWO) TIMES DAILY.keep appt for refills 60 capsule 0  . dofetilide (TIKOSYN) 500 MCG capsule Take 1 capsule (500 mcg total) by mouth 2 (two) times daily. Appointment Required For Further Refills [phone] 180 capsule 0  . escitalopram (LEXAPRO) 20 MG tablet Take 20 mg by mouth Daily. Lexapro    . metoprolol tartrate (LOPRESSOR) 50 MG tablet TAKE 1 TABLET BY MOUTH TWICE A DAY 60 tablet 0  . OVER THE COUNTER MEDICATION Take 400 mg by mouth 2 (two) times daily. Magnical/D Take 1 tablet by mouth twice a day    . potassium chloride (KLOR-CON M10) 10 MEQ tablet Take 1 tablet (10 mEq total) by mouth daily. 30 tablet 0  . rOPINIRole (REQUIP) 1 MG tablet Take 1 mg by mouth at bedtime.      Marland Kitchen VYVANSE 70 MG capsule Take 70 mg by mouth Daily.      No current facility-administered medications for this encounter.    Allergies  Allergen Reactions  . Amiodarone Other (See Comments)    REACTION: dyspnea    Social History   Socioeconomic History  . Marital status: Married    Spouse name: Not on file  . Number of children: Not on file  . Years of education: Not on file  . Highest education level: Not on file  Occupational History  . Not on file  Tobacco Use  . Smoking status: Never Smoker  . Smokeless tobacco: Never Used  Substance and Sexual Activity  . Alcohol use: No  . Drug use: No  . Sexual activity: Not on file  Other Topics Concern  . Not on file  Social History Narrative   Pt lives in [place] alone.  Works at [employer] Strain:   . Difficulty of  Paying Living Expenses: Not on file  Food Insecurity:   . Worried About Charity fundraiser in the Last Year: Not on file  . Ran Out of Food in the Last Year: Not on file  Transportation Needs:   . Lack of Transportation (Medical): Not on file  . Lack of Transportation (Non-Medical): Not on file  Physical Activity:   . Days of Exercise per Week: Not on file  . Minutes of Exercise per Session: Not on file  Stress:   . Feeling of Stress : Not on file  Social Connections:   . Frequency of Communication with Friends and Family: Not on file  . Frequency of Social Gatherings with Friends and Family: Not on  file  . Attends Religious Services: Not on file  . Active Member of Clubs or Organizations: Not on file  . Attends Archivist Meetings: Not on file  . Marital Status: Not on file  Intimate Partner Violence:   . Fear of Current or Ex-Partner: Not on file  . Emotionally Abused: Not on file  . Physically Abused: Not on file  . Sexually Abused: Not on file    Family History  Problem Relation Age of Onset  . Coronary artery disease Father     ROS- All systems are reviewed and negative except as per the HPI above  Physical Exam: There were no vitals filed for this visit.  GEN- The patient is well appearing, alert and oriented x 3 today.   Head- normocephalic, atraumatic Eyes-  Sclera clear, conjunctiva pink Ears- hearing intact Oropharynx- clear Neck- supple, no JVP Lymph- no cervical lymphadenopathy Lungs- Clear to ausculation bilaterally, normal work of breathing Heart- Regular rate and rhythm, no murmurs, rubs or gallops, PMI not laterally displaced GI- soft, NT, ND, + BS Extremities- no clubbing, cyanosis, or edema MS- no significant deformity or atrophy Skin- no rash or lesion Psych- euthymic mood, full affect Neuro- strength and sensation are intact  EKG- Sinus rhythm with first degree AV block at 50 bpm,  pr int 228 ms, qrs iny 88 ms, qtc 483 ms  (stable) Epic records reviewed  Assessment and Plan: 1. Persistent  AFib Maintaining SR with dofetilide, continue 500 mcg bid  Continue metoprolol tartrate 50 mg bid  Cmet/mag today   2. H/o upper GI bleed Resolved  Avoid antiinflammatories Continue pradaxa 150 mg bid CBC today   3. HTN Elevated today Rechecked at `156/82 States took BB late this am  Encouraged  to check at home   4. Hyperlipidemia Continue Lipitor 10 mg daily  Am checking lipid panel as she has failed to get a PCP  I have encouraged  to establish with one for years     F/u with afib clinic in 6 months    Butch Penny C. Kambri Dismore, [facility] [address] [place], [place] [address] [phone]

## 2020-01-07 ENCOUNTER — Other Ambulatory Visit (HOSPITAL_COMMUNITY): Payer: PPO | Admitting: Physician Assistant

## 2020-01-07 ENCOUNTER — Ambulatory Visit (HOSPITAL_COMMUNITY)
Admission: RE | Admit: 2020-01-07 | Discharge: 2020-01-07 | Disposition: A | Discharge status: Home / Self Care | Payer: PPO | Source: Ambulatory Visit | Attending: Physician Assistant | Admitting: Physician Assistant

## 2020-01-07 ENCOUNTER — Other Ambulatory Visit: Payer: Self-pay

## 2020-01-07 DIAGNOSIS — I4819 Other persistent atrial fibrillation: Secondary | ICD-10-CM | POA: Insufficient documentation

## 2020-01-07 LAB — BASIC METABOLIC PANEL
Anion gap: 8 (ref 5–15)
BUN: 28 mg/dL — ABNORMAL HIGH (ref 8–23)
CO2: 26 mmol/L (ref 22–32)
Calcium: 8.9 mg/dL (ref 8.9–10.3)
Chloride: 107 mmol/L (ref 98–111)
Creatinine, Ser: 1.7 mg/dL — ABNORMAL HIGH (ref 0.44–1.00)
GFR calc non Af Amer: 30 mL/min — ABNORMAL LOW (ref >60)
Glucose, Bld: 125 mg/dL — ABNORMAL HIGH (ref 70–99)
Potassium: 5.6 mmol/L — ABNORMAL HIGH (ref 3.5–5.1)
Sodium: 141 mmol/L (ref 135–145)

## 2020-01-08 ENCOUNTER — Other Ambulatory Visit (HOSPITAL_COMMUNITY): Payer: Self-pay | Admitting: Nurse Practitioner

## 2020-01-14 ENCOUNTER — Ambulatory Visit (HOSPITAL_COMMUNITY)
Admission: RE | Admit: 2020-01-14 | Discharge: 2020-01-14 | Disposition: A | Discharge status: Home / Self Care | Payer: PPO | Source: Ambulatory Visit | Attending: Nurse Practitioner | Admitting: Nurse Practitioner

## 2020-01-14 ENCOUNTER — Other Ambulatory Visit: Payer: Self-pay

## 2020-01-14 DIAGNOSIS — N289 Disorder of kidney and ureter, unspecified: Secondary | ICD-10-CM | POA: Diagnosis not present

## 2020-01-14 LAB — BASIC METABOLIC PANEL
Anion gap: 8 (ref 5–15)
BUN: 29 mg/dL — ABNORMAL HIGH (ref 8–23)
CO2: 27 mmol/L (ref 22–32)
Calcium: 8.8 mg/dL — ABNORMAL LOW (ref 8.9–10.3)
Chloride: 106 mmol/L (ref 98–111)
Creatinine, Ser: 2.15 mg/dL — ABNORMAL HIGH (ref 0.44–1.00)
GFR, Estimated: 22 mL/min — ABNORMAL LOW (ref >60)
Glucose, Bld: 88 mg/dL (ref 70–99)
Potassium: 5.4 mmol/L — ABNORMAL HIGH (ref 3.5–5.1)
Sodium: 141 mmol/L (ref 135–145)

## 2020-01-15 ENCOUNTER — Other Ambulatory Visit (HOSPITAL_COMMUNITY): Payer: Self-pay | Admitting: *Deleted

## 2020-01-15 ENCOUNTER — Ambulatory Visit (HOSPITAL_COMMUNITY)
Admission: RE | Admit: 2020-01-15 | Discharge: 2020-01-15 | Disposition: A | Discharge status: Home / Self Care | Payer: PPO | Source: Ambulatory Visit | Attending: Nurse Practitioner | Admitting: Nurse Practitioner

## 2020-01-15 DIAGNOSIS — I4891 Unspecified atrial fibrillation: Secondary | ICD-10-CM | POA: Insufficient documentation

## 2020-01-15 LAB — CBC
HCT: 43.2 % (ref 36.0–46.0)
Hemoglobin: 13.7 g/dL (ref 12.0–15.0)
MCH: 29.1 pg (ref 26.0–34.0)
MCHC: 31.7 g/dL (ref 30.0–36.0)
MCV: 91.7 fL (ref 80.0–100.0)
Platelets: 233 10*3/uL (ref 150–400)
RBC: 4.71 MIL/uL (ref 3.87–5.11)
RDW: 13.2 % (ref 11.5–15.5)
WBC: 6.2 10*3/uL (ref 4.0–10.5)
nRBC: 0 % (ref 0.0–0.2)

## 2020-01-15 MED ORDER — DOFETILIDE 250 MCG PO CAPS
250.0000 ug | ORAL_CAPSULE | Freq: Two times a day (BID) | ORAL | 3 refills | Status: DC
Start: 2020-01-15 — End: 2020-08-03

## 2020-01-18 NOTE — Progress Notes (Addendum)
Pt was asked to return today for a CBC since her renal function is steadily  declining and she had a GI bleed in the past.  CBC normal. I discussed with Dr. Rayann Heman this am about her declining renal function. I get her crcl cal at 36, Pradaxa will not need adjusting.but at that crcl she should be at 125 mcg bid. Dr. Rayann Heman recommended to reduce  to 250 mcg bid and check bmet again in 2 weeks. I will refer her to nephrology.she was encouraged to drink more water. She  does not have a PCP.    EKG shows sinus brady at 59 bpm, pr int 222 ms, qrs int 90 ms, qtc 485 ms.

## 2020-01-18 NOTE — Addendum Note (Signed)
Encounter addended by: Sherran Needs, NP on: 01/18/2020 9:02 AM  Actions taken: Clinical Note Signed

## 2020-01-28 ENCOUNTER — Ambulatory Visit (HOSPITAL_COMMUNITY)
Admission: RE | Admit: 2020-01-28 | Discharge: 2020-01-28 | Disposition: A | Discharge status: Home / Self Care | Payer: PPO | Source: Ambulatory Visit | Attending: Nurse Practitioner | Admitting: Nurse Practitioner

## 2020-01-28 ENCOUNTER — Other Ambulatory Visit: Payer: Self-pay

## 2020-01-28 VITALS — BP 168/76 | HR 63 | Wt 217.4 lb

## 2020-01-28 DIAGNOSIS — N289 Disorder of kidney and ureter, unspecified: Secondary | ICD-10-CM

## 2020-01-28 DIAGNOSIS — I4891 Unspecified atrial fibrillation: Secondary | ICD-10-CM | POA: Diagnosis not present

## 2020-01-28 DIAGNOSIS — I440 Atrioventricular block, first degree: Secondary | ICD-10-CM | POA: Diagnosis not present

## 2020-01-28 LAB — BASIC METABOLIC PANEL
Anion gap: 10 (ref 5–15)
BUN: 24 mg/dL — ABNORMAL HIGH (ref 8–23)
CO2: 26 mmol/L (ref 22–32)
Calcium: 9.1 mg/dL (ref 8.9–10.3)
Chloride: 103 mmol/L (ref 98–111)
Creatinine, Ser: 1.6 mg/dL — ABNORMAL HIGH (ref 0.44–1.00)
GFR, Estimated: 34 mL/min — ABNORMAL LOW (ref >60)
Glucose, Bld: 125 mg/dL — ABNORMAL HIGH (ref 70–99)
Potassium: 4.9 mmol/L (ref 3.5–5.1)
Sodium: 139 mmol/L (ref 135–145)

## 2020-01-28 NOTE — Progress Notes (Signed)
Pt in for EKG since Tikosyn dose was changed to 250 mcg bid form 500 mcg bid  for qt interval lengthening. Is is improved today at 489 ms.  Sinus rhythm at 61 bpm, with first degree AV block. Bmet also repeated to day for elevated serum potassium and climbing creatine. She is pending an appointment with nephrology 11/4. She reports that she is drinking smart water which contains K+, asked her to drink  water without electrolyte additives only.  Bmet wil be forwarded to nephrology.

## 2020-02-04 DIAGNOSIS — N1832 Chronic kidney disease, stage 3b: Secondary | ICD-10-CM | POA: Diagnosis not present

## 2020-02-04 DIAGNOSIS — I129 Hypertensive chronic kidney disease with stage 1 through stage 4 chronic kidney disease, or unspecified chronic kidney disease: Secondary | ICD-10-CM | POA: Diagnosis not present

## 2020-02-04 DIAGNOSIS — E875 Hyperkalemia: Secondary | ICD-10-CM | POA: Diagnosis not present

## 2020-02-04 DIAGNOSIS — M899 Disorder of bone, unspecified: Secondary | ICD-10-CM | POA: Diagnosis not present

## 2020-04-02 DIAGNOSIS — K922 Gastrointestinal hemorrhage, unspecified: Secondary | ICD-10-CM

## 2020-04-02 HISTORY — DX: Gastrointestinal hemorrhage, unspecified: K92.2

## 2020-05-09 DIAGNOSIS — E875 Hyperkalemia: Secondary | ICD-10-CM | POA: Diagnosis not present

## 2020-05-09 DIAGNOSIS — N1832 Chronic kidney disease, stage 3b: Secondary | ICD-10-CM | POA: Diagnosis not present

## 2020-05-09 DIAGNOSIS — I129 Hypertensive chronic kidney disease with stage 1 through stage 4 chronic kidney disease, or unspecified chronic kidney disease: Secondary | ICD-10-CM | POA: Diagnosis not present

## 2020-05-09 DIAGNOSIS — I4819 Other persistent atrial fibrillation: Secondary | ICD-10-CM | POA: Diagnosis not present

## 2020-05-09 DIAGNOSIS — N2581 Secondary hyperparathyroidism of renal origin: Secondary | ICD-10-CM | POA: Diagnosis not present

## 2020-05-11 ENCOUNTER — Other Ambulatory Visit: Payer: Self-pay | Admitting: Nephrology

## 2020-05-11 DIAGNOSIS — N1832 Chronic kidney disease, stage 3b: Secondary | ICD-10-CM

## 2020-06-11 ENCOUNTER — Other Ambulatory Visit (HOSPITAL_COMMUNITY): Payer: Self-pay | Admitting: Nurse Practitioner

## 2020-07-08 ENCOUNTER — Other Ambulatory Visit (HOSPITAL_COMMUNITY): Payer: Self-pay | Admitting: Nurse Practitioner

## 2020-07-14 ENCOUNTER — Other Ambulatory Visit (HOSPITAL_COMMUNITY): Payer: Self-pay | Admitting: Nurse Practitioner

## 2020-07-22 ENCOUNTER — Other Ambulatory Visit (HOSPITAL_COMMUNITY): Payer: Self-pay | Admitting: Nurse Practitioner

## 2020-08-03 ENCOUNTER — Encounter (HOSPITAL_COMMUNITY): Payer: Self-pay | Admitting: Nurse Practitioner

## 2020-08-03 ENCOUNTER — Other Ambulatory Visit: Payer: Self-pay

## 2020-08-03 ENCOUNTER — Ambulatory Visit (HOSPITAL_COMMUNITY)
Admission: RE | Admit: 2020-08-03 | Discharge: 2020-08-03 | Disposition: A | Discharge status: Home / Self Care | Payer: PPO | Source: Ambulatory Visit | Attending: Nurse Practitioner | Admitting: Nurse Practitioner

## 2020-08-03 VITALS — BP 134/70 | HR 97 | Ht 69.0 in | Wt 217.6 lb

## 2020-08-03 DIAGNOSIS — Z888 Allergy status to other drugs, medicaments and biological substances status: Secondary | ICD-10-CM | POA: Insufficient documentation

## 2020-08-03 DIAGNOSIS — D6869 Other thrombophilia: Secondary | ICD-10-CM | POA: Diagnosis not present

## 2020-08-03 DIAGNOSIS — Z79899 Other long term (current) drug therapy: Secondary | ICD-10-CM | POA: Insufficient documentation

## 2020-08-03 DIAGNOSIS — I4892 Unspecified atrial flutter: Secondary | ICD-10-CM | POA: Insufficient documentation

## 2020-08-03 DIAGNOSIS — I10 Essential (primary) hypertension: Secondary | ICD-10-CM | POA: Insufficient documentation

## 2020-08-03 DIAGNOSIS — I4819 Other persistent atrial fibrillation: Secondary | ICD-10-CM | POA: Insufficient documentation

## 2020-08-03 DIAGNOSIS — E785 Hyperlipidemia, unspecified: Secondary | ICD-10-CM | POA: Insufficient documentation

## 2020-08-03 DIAGNOSIS — Z8711 Personal history of peptic ulcer disease: Secondary | ICD-10-CM | POA: Diagnosis not present

## 2020-08-03 LAB — COMPREHENSIVE METABOLIC PANEL
ALT: 13 U/L (ref 0–44)
AST: 21 U/L (ref 15–41)
Albumin: 3.4 g/dL — ABNORMAL LOW (ref 3.5–5.0)
Alkaline Phosphatase: 104 U/L (ref 38–126)
Anion gap: 7 (ref 5–15)
BUN: 31 mg/dL — ABNORMAL HIGH (ref 8–23)
CO2: 26 mmol/L (ref 22–32)
Calcium: 9 mg/dL (ref 8.9–10.3)
Chloride: 109 mmol/L (ref 98–111)
Creatinine, Ser: 2.05 mg/dL — ABNORMAL HIGH (ref 0.44–1.00)
GFR, Estimated: 25 mL/min — ABNORMAL LOW (ref >60)
Glucose, Bld: 98 mg/dL (ref 70–99)
Potassium: 5.3 mmol/L — ABNORMAL HIGH (ref 3.5–5.1)
Sodium: 142 mmol/L (ref 135–145)
Total Bilirubin: 0.7 mg/dL (ref 0.3–1.2)
Total Protein: 6.8 g/dL (ref 6.5–8.1)

## 2020-08-03 LAB — CBC
HCT: 44 % (ref 36.0–46.0)
Hemoglobin: 13.9 g/dL (ref 12.0–15.0)
MCH: 28.7 pg (ref 26.0–34.0)
MCHC: 31.6 g/dL (ref 30.0–36.0)
MCV: 90.7 fL (ref 80.0–100.0)
Platelets: 277 10*3/uL (ref 150–400)
RBC: 4.85 MIL/uL (ref 3.87–5.11)
RDW: 13.2 % (ref 11.5–15.5)
WBC: 7 10*3/uL (ref 4.0–10.5)
nRBC: 0 % (ref 0.0–0.2)

## 2020-08-03 LAB — MAGNESIUM: Magnesium: 2.3 mg/dL (ref 1.7–2.4)

## 2020-08-03 MED ORDER — DOFETILIDE 250 MCG PO CAPS: 250.0000 ug | ORAL_CAPSULE | Freq: Two times a day (BID) | ORAL | 3 refills | Status: DC

## 2020-08-03 NOTE — Patient Instructions (Signed)
Decrease tikosyn to 263mcg twice a day TODAY

## 2020-08-03 NOTE — Progress Notes (Addendum)
Patient ID: LUVERTA KORTE, female   DOB: 19-May-1945, 75 y.o.   MRN: [id number]     Primary Care Physician: Patient, No Pcp Per (Inactive) Referring Physician: Dr. Nyoka Lint is a 75 y.o. female with a h/o afib, s/p convergent procedure 2013, on tikosyn. She is in the clinic for f/u. She had   GI bleed, last October, was taking naprosyn with pradaxa for low back pain. Had presenting HGB of 6.9, received 3 units of blood. Was found to have a gastric ulcer, endoscopy and colonoscopy was done in the hospital. She was seen f/u by GI but has not had any further f/u of her blood count as she still does not have a PCP.  This in the past has been  stressed to the pt importance of getting established with one. She states that she is working on it.   She denies  feeling   weakness or shortness of breath with exertion as she did with the anemia.. She has remained in SR during this time. No further bleeding issues.   F/u in afib clinic, 05/19/19. Pt is here for Tikosyn surveillance. Reports no afib. Feels well. Still has not established with a PCP.   F/u in afib clinic, 12/24/19. Does not report any  afib. Compliant with Tikosyn and Pradaxa with a CHA2DS2VASc score of 4. Fasting blood work to f/u on refill of Lipitor as she has not established with a PCP. BP is elevated today. States she took BB just minutes ago in her car. No concerns today.   F/u in afib clinic, 08/03/20. No afib to report. On last visit, her Tikosyn dose was reduced for decline in kidney function.SHe was referred to nephrology and I think her function stabilized. Will check again today. In conversation, pt states that she has been opening the capsule of 500 mcg  tikosyn and spitting the capsules into 2 doses. Informed pt that this is dangerous and this is not recommended. She states she did not want to waste money . I will re send the 250 mcg capsule and repeat EKG when back on correct dose in 2-3 days.Ekg shows atrial flutter  today at 97 bpm.    Today, she denies symptoms of palpitations, chest pain,  orthopnea, PND, lower extremity edema, dizziness, presyncope, syncope, or neurologic sequela.  The patient is tolerating medications without difficulties and is otherwise without complaint today.   Past Medical History:  Diagnosis Date  . Attention deficit    on Vyvanse  . Chronic anticoagulation   . Depression   . Dyslipidemia   . Hypertension   . Hypokalemia   . Morbid obesity ([place])   . Palpitations   . Persistent atrial fibrillation ([place]) 05/27/14 Chadsvasc score of 4   s/p cardioversion Sept 2012; failed   Past Surgical History:  Procedure Laterality Date  . BIOPSY  01/02/2018   Procedure: BIOPSY;  Surgeon: Mauri Pole, MD;  Location: [place];  Service: Endoscopy;;  . BREAST BIOPSY    . CARDIOVERSION  Sept 2012   failed  . CARDIOVERSION N/A 09/08/2012   Procedure: CARDIOVERSION;  Surgeon: Fay Records, MD;  Location: [facility] ENDOSCOPY;  Service: Cardiovascular;  Laterality: N/A;  . CARDIOVERSION N/A 02/15/2013   Procedure: CARDIOVERSION-BEDSIDE;  Surgeon: Darlin Coco, MD;  Location: [place];  Service: Cardiovascular;  Laterality: N/A;  . CARDIOVERSION N/A 02/05/2011   Procedure: CARDIOVERSION;  Surgeon: Evans Lance, MD;  Location: [facility] CATH LAB;  Service: Cardiovascular;  Laterality:  N/A;  . COLONOSCOPY N/A 01/03/2018   Procedure: COLONOSCOPY;  Surgeon: Mauri Pole, MD;  Location: [place] ENDOSCOPY;  Service: Endoscopy;  Laterality: N/A;  . Convergent procedure  11/13/11   afib ablation at [facility]  . EP IMPLANTABLE DEVICE N/A 02/09/2015   Procedure: Loop Recorder Removal;  Surgeon: [name], MD;  Location: [place] CV LAB;  Service: Cardiovascular;  Laterality: N/A;  . ESOPHAGOGASTRODUODENOSCOPY (EGD) WITH PROPOFOL N/A 01/02/2018   Procedure: ESOPHAGOGASTRODUODENOSCOPY (EGD) WITH PROPOFOL;  Surgeon: Mauri Pole, MD;  Location: [place] ENDOSCOPY;  Service: Endoscopy;  Laterality: N/A;  .  loop recorder insertion Left 09/2011  . Loop recorder removal  02/09/15   MDT Reveal XT loop recorder removed by Dr Rayann Heman  . POLYPECTOMY  01/03/2018   Procedure: POLYPECTOMY;  Surgeon: Mauri Pole, MD;  Location: [place];  Service: Endoscopy;;  . TEE WITHOUT CARDIOVERSION N/A 09/08/2012   Procedure: TRANSESOPHAGEAL ECHOCARDIOGRAM (TEE);  Surgeon: Fay Records, MD;  Location: [facility] ENDOSCOPY;  Service: Cardiovascular;  Laterality: N/A;  . TONSILLECTOMY    . TONSILLECTOMY    . US ECHOCARDIOGRAPHY  Aug 9924   Normal systolic function with EF 55% and slight LAE at 4.4cm    Current Outpatient Medications  Medication Sig Dispense Refill  . amphetamine-dextroamphetamine (ADDERALL) 15 MG tablet Take 15 mg by mouth daily as needed (for additional focus).     Marland Kitchen atorvastatin (LIPITOR) 10 MG tablet TAKE 1 TABLET (10 MG TOTAL) BY MOUTH DAILY. APPOINTMENT REQUIRED FOR FURTHER REFILLS [phone] 15 tablet 0  . dabigatran (PRADAXA) 150 MG CAPS capsule TAKE 1 CAPSULE BY MOUTH TWICE A DAYappt required for refill [phone] capsule 0  . escitalopram (LEXAPRO) 20 MG tablet Take 20 mg by mouth Daily. Lexapro    . metoprolol tartrate (LOPRESSOR) 50 MG tablet Take 1 tablet (50 mg total) by mouth 2 (two) times daily. appt required for refill [phone] tablet 0  . rOPINIRole (REQUIP) 1 MG tablet Take 1 mg by mouth at bedtime.    Marland Kitchen VYVANSE 70 MG capsule Take 70 mg by mouth Daily.      No current facility-administered medications for this encounter.    Allergies  Allergen Reactions  . Amiodarone Other (See Comments)    REACTION: dyspnea    Social History   Socioeconomic History  . Marital status: Married    Spouse name: Not on file  . Number of children: Not on file  . Years of education: Not on file  . Highest education level: Not on file  Occupational History  . Not on file  Tobacco Use  . Smoking status: Never Smoker  . Smokeless tobacco: Never Used  Substance and Sexual  Activity  . Alcohol use: No  . Drug use: No  . Sexual activity: Not on file  Other Topics Concern  . Not on file  Social History Narrative   Pt lives in [place] alone.  Works at [employer] Strain: Not on [employer] Insecurity: Not on file  Transportation Needs: Not on file  Physical Activity: Not on file  Stress: Not on file  Social Connections: Not on file  Intimate Partner Violence: Not on file    Family History  Problem Relation Age of Onset  . Coronary artery disease Father     ROS- All systems are reviewed and negative except as per the HPI above  Physical Exam: Vitals:   08/03/20 1448  BP: 134/70  Pulse: 97  Weight: 98.7 kg  Height: 5\' 9"  (1.753 m)    GEN- The patient is well appearing, alert and oriented x 3 today.   Head- normocephalic, atraumatic Eyes-  Sclera clear, conjunctiva pink Ears- hearing intact Oropharynx- clear Neck- supple, no JVP Lymph- no cervical lymphadenopathy Lungs- Clear to ausculation bilaterally, normal work of breathing Heart- Regular rate and rhythm, no murmurs, rubs or gallops, PMI not laterally displaced GI- soft, NT, ND, + BS Extremities- no clubbing, cyanosis, or edema MS- no significant deformity or atrophy Skin- no rash or lesion Psych- euthymic mood, full affect Neuro- strength and sensation are intact  EKG-  Atrial flutter at 97 bpm,   pr int 142 ms, qrs int 86 ms, qtc 464 ms  Epic records reviewed  Assessment and Plan: 1. Persistent  AFib In atrial   flutter today  Unfortunately, has been splitting capsule of Tikosyn 500 mcg capsule, by trying to equally spit  the powder inside She was informed not to do this I will send in RX for 250 mcg bid and repeat EKG in 2-3 days, to see qt and rhythm at that time  Continue metoprolol tartrate 50 mg bid  Cmet/mag today  If remains out of rhythm will have a conversation with her  CV vrs stopping  tikosyn and  rate control going forward   2. H/o upper GI bleed Resolved  Avoid antiinflammatories Continue pradaxa 150 mg bid CBC today   3. HTN Stable   4. Hyperlipidemia Continue Lipitor 10 mg daily  Am checking lipid panel as she has failed to get a PCP  I have encouraged  to establish with one for years     F/u with afib clinic on Friday    Addendum- 5/5-  pt creatinine is back to 2.05 with crcl cal at 37. This means further reduction in dose to 125 mcg. Since pt has shown some noncompliance with drug dosing with tikosyn, and CKD, will stop tikosyn as she was out of rhythm anyway yesterday and was asymptomatic. She will still f/u on Friday. May need increase in BB.    Geroge Baseman Benjamine Strout, [facility] [address] [place], [place] [address] [phone]

## 2020-08-04 ENCOUNTER — Other Ambulatory Visit (HOSPITAL_COMMUNITY): Payer: Self-pay | Admitting: *Deleted

## 2020-08-04 NOTE — Addendum Note (Signed)
Encounter addended by: Sherran Needs, NP on: 08/04/2020 8:40 AM  Actions taken: Clinical Note Signed

## 2020-08-05 ENCOUNTER — Other Ambulatory Visit: Payer: Self-pay

## 2020-08-05 ENCOUNTER — Ambulatory Visit (HOSPITAL_COMMUNITY)
Admission: RE | Admit: 2020-08-05 | Discharge: 2020-08-05 | Disposition: A | Discharge status: Home / Self Care | Payer: PPO | Source: Ambulatory Visit | Attending: Physician Assistant | Admitting: Physician Assistant

## 2020-08-05 DIAGNOSIS — I4819 Other persistent atrial fibrillation: Secondary | ICD-10-CM

## 2020-08-05 DIAGNOSIS — I440 Atrioventricular block, first degree: Secondary | ICD-10-CM | POA: Insufficient documentation

## 2020-08-05 NOTE — Progress Notes (Signed)
Patient returns for ECG after stopping dofetilide. ECG shows SR HR 65, 1st degree AV block, PR 224, QRS 92, QTc 468. Will continue present therapy. F/u in the AF clinic in 6 months.

## 2020-08-06 ENCOUNTER — Other Ambulatory Visit (HOSPITAL_COMMUNITY): Payer: Self-pay | Admitting: Nurse Practitioner

## 2020-08-11 ENCOUNTER — Encounter (HOSPITAL_COMMUNITY): Payer: Self-pay | Admitting: Nurse Practitioner

## 2020-08-11 NOTE — Addendum Note (Signed)
Encounter addended by: Sherran Needs, NP on: 08/11/2020 8:36 AM  Actions taken: Medication List reviewed, Problem List reviewed, Allergies reviewed, Level of Service modified

## 2020-08-16 ENCOUNTER — Other Ambulatory Visit: Payer: PPO

## 2020-11-10 ENCOUNTER — Other Ambulatory Visit (HOSPITAL_COMMUNITY): Payer: Self-pay | Admitting: Nurse Practitioner

## 2021-01-25 ENCOUNTER — Emergency Department (HOSPITAL_COMMUNITY): Payer: PPO

## 2021-01-25 ENCOUNTER — Emergency Department (HOSPITAL_BASED_OUTPATIENT_CLINIC_OR_DEPARTMENT_OTHER): Payer: PPO

## 2021-01-25 ENCOUNTER — Other Ambulatory Visit: Payer: Self-pay

## 2021-01-25 ENCOUNTER — Emergency Department (HOSPITAL_COMMUNITY)
Admission: EM | Admit: 2021-01-25 | Discharge: 2021-01-25 | Disposition: A | Discharge status: Home / Self Care | Payer: PPO | Attending: Emergency Medicine | Admitting: Emergency Medicine

## 2021-01-25 DIAGNOSIS — Z79899 Other long term (current) drug therapy: Secondary | ICD-10-CM | POA: Diagnosis not present

## 2021-01-25 DIAGNOSIS — I10 Essential (primary) hypertension: Secondary | ICD-10-CM | POA: Diagnosis not present

## 2021-01-25 DIAGNOSIS — R4701 Aphasia: Secondary | ICD-10-CM | POA: Insufficient documentation

## 2021-01-25 DIAGNOSIS — Y93H2 Activity, gardening and landscaping: Secondary | ICD-10-CM | POA: Insufficient documentation

## 2021-01-25 DIAGNOSIS — R0902 Hypoxemia: Secondary | ICD-10-CM | POA: Diagnosis not present

## 2021-01-25 DIAGNOSIS — S300XXA Contusion of lower back and pelvis, initial encounter: Secondary | ICD-10-CM | POA: Insufficient documentation

## 2021-01-25 DIAGNOSIS — Z7901 Long term (current) use of anticoagulants: Secondary | ICD-10-CM | POA: Diagnosis not present

## 2021-01-25 DIAGNOSIS — R29818 Other symptoms and signs involving the nervous system: Secondary | ICD-10-CM | POA: Diagnosis not present

## 2021-01-25 DIAGNOSIS — Y900 Blood alcohol level of less than 20 mg/100 ml: Secondary | ICD-10-CM | POA: Insufficient documentation

## 2021-01-25 DIAGNOSIS — W19XXXA Unspecified fall, initial encounter: Secondary | ICD-10-CM | POA: Insufficient documentation

## 2021-01-25 DIAGNOSIS — S20212A Contusion of left front wall of thorax, initial encounter: Secondary | ICD-10-CM | POA: Diagnosis not present

## 2021-01-25 DIAGNOSIS — R4781 Slurred speech: Secondary | ICD-10-CM | POA: Diagnosis not present

## 2021-01-25 DIAGNOSIS — I671 Cerebral aneurysm, nonruptured: Secondary | ICD-10-CM | POA: Insufficient documentation

## 2021-01-25 DIAGNOSIS — G459 Transient cerebral ischemic attack, unspecified: Secondary | ICD-10-CM

## 2021-01-25 DIAGNOSIS — R000 Tachycardia, unspecified: Secondary | ICD-10-CM | POA: Diagnosis not present

## 2021-01-25 DIAGNOSIS — I619 Nontraumatic intracerebral hemorrhage, unspecified: Secondary | ICD-10-CM | POA: Diagnosis not present

## 2021-01-25 DIAGNOSIS — I639 Cerebral infarction, unspecified: Secondary | ICD-10-CM | POA: Diagnosis not present

## 2021-01-25 DIAGNOSIS — S299XXA Unspecified injury of thorax, initial encounter: Secondary | ICD-10-CM | POA: Diagnosis present

## 2021-01-25 DIAGNOSIS — I739 Peripheral vascular disease, unspecified: Secondary | ICD-10-CM | POA: Diagnosis not present

## 2021-01-25 DIAGNOSIS — Z20822 Contact with and (suspected) exposure to covid-19: Secondary | ICD-10-CM | POA: Insufficient documentation

## 2021-01-25 DIAGNOSIS — R4702 Dysphasia: Secondary | ICD-10-CM | POA: Diagnosis not present

## 2021-01-25 DIAGNOSIS — I4819 Other persistent atrial fibrillation: Secondary | ICD-10-CM | POA: Diagnosis not present

## 2021-01-25 DIAGNOSIS — I4891 Unspecified atrial fibrillation: Secondary | ICD-10-CM | POA: Diagnosis not present

## 2021-01-25 DIAGNOSIS — I720 Aneurysm of carotid artery: Secondary | ICD-10-CM | POA: Diagnosis not present

## 2021-01-25 LAB — RESP PANEL BY RT-PCR (FLU A&B, COVID) ARPGX2
Influenza A by PCR: NEGATIVE
Influenza B by PCR: NEGATIVE
SARS Coronavirus 2 by RT PCR: NEGATIVE

## 2021-01-25 LAB — DIFFERENTIAL
Abs Immature Granulocytes: 0.04 10*3/uL (ref 0.00–0.07)
Basophils Absolute: 0 10*3/uL (ref 0.0–0.1)
Basophils Relative: 1 %
Eosinophils Absolute: 0.2 10*3/uL (ref 0.0–0.5)
Eosinophils Relative: 2 %
Immature Granulocytes: 1 %
Lymphocytes Relative: 23 %
Lymphs Abs: 1.6 10*3/uL (ref 0.7–4.0)
Monocytes Absolute: 0.8 10*3/uL (ref 0.1–1.0)
Monocytes Relative: 11 %
Neutro Abs: 4.3 10*3/uL (ref 1.7–7.7)
Neutrophils Relative %: 62 %

## 2021-01-25 LAB — COMPREHENSIVE METABOLIC PANEL
ALT: 13 U/L (ref 0–44)
AST: 27 U/L (ref 15–41)
Albumin: 3.2 g/dL — ABNORMAL LOW (ref 3.5–5.0)
Alkaline Phosphatase: 85 U/L (ref 38–126)
Anion gap: 7 (ref 5–15)
BUN: 35 mg/dL — ABNORMAL HIGH (ref 8–23)
CO2: 26 mmol/L (ref 22–32)
Calcium: 8.7 mg/dL — ABNORMAL LOW (ref 8.9–10.3)
Chloride: 105 mmol/L (ref 98–111)
Creatinine, Ser: 2.19 mg/dL — ABNORMAL HIGH (ref 0.44–1.00)
GFR, Estimated: 23 mL/min — ABNORMAL LOW (ref >60)
Glucose, Bld: 99 mg/dL (ref 70–99)
Potassium: 4.8 mmol/L (ref 3.5–5.1)
Sodium: 138 mmol/L (ref 135–145)
Total Bilirubin: 0.6 mg/dL (ref 0.3–1.2)
Total Protein: 6.5 g/dL (ref 6.5–8.1)

## 2021-01-25 LAB — I-STAT CHEM 8, ED
BUN: 40 mg/dL — ABNORMAL HIGH (ref 8–23)
Calcium, Ion: 1.06 mmol/L — ABNORMAL LOW (ref 1.15–1.40)
Chloride: 106 mmol/L (ref 98–111)
Creatinine, Ser: 2.1 mg/dL — ABNORMAL HIGH (ref 0.44–1.00)
Glucose, Bld: 96 mg/dL (ref 70–99)
HCT: 42 % (ref 36.0–46.0)
Hemoglobin: 14.3 g/dL (ref 12.0–15.0)
Potassium: 4.7 mmol/L (ref 3.5–5.1)
Sodium: 138 mmol/L (ref 135–145)
TCO2: 24 mmol/L (ref 22–32)

## 2021-01-25 LAB — ECHOCARDIOGRAM COMPLETE
AR max vel: 2.13 cm2
AV Area VTI: 2.23 cm2
AV Area mean vel: 2.08 cm2
AV Mean grad: 3 mmHg
AV Peak grad: 6.4 mmHg
Ao pk vel: 1.26 m/s
Height: 69 in
S' Lateral: 2.8 cm
Weight: 3392 oz

## 2021-01-25 LAB — PROTIME-INR
INR: 1.9 — ABNORMAL HIGH (ref 0.8–1.2)
Prothrombin Time: 21.9 seconds — ABNORMAL HIGH (ref 11.4–15.2)

## 2021-01-25 LAB — CBC
HCT: 42.6 % (ref 36.0–46.0)
Hemoglobin: 13.6 g/dL (ref 12.0–15.0)
MCH: 27.9 pg (ref 26.0–34.0)
MCHC: 31.9 g/dL (ref 30.0–36.0)
MCV: 87.3 fL (ref 80.0–100.0)
Platelets: 298 10*3/uL (ref 150–400)
RBC: 4.88 MIL/uL (ref 3.87–5.11)
RDW: 15 % (ref 11.5–15.5)
WBC: 6.8 10*3/uL (ref 4.0–10.5)
nRBC: 0 % (ref 0.0–0.2)

## 2021-01-25 LAB — ETHANOL: Alcohol, Ethyl (B): <10 mg/dL (ref <10)

## 2021-01-25 LAB — APTT: aPTT: 57 seconds — ABNORMAL HIGH (ref 24–36)

## 2021-01-25 MED ORDER — LORAZEPAM 2 MG/ML IJ SOLN: 1.0000 mg | Freq: Once | INTRAMUSCULAR | Status: DC | PRN

## 2021-01-25 MED ORDER — SODIUM CHLORIDE 0.9 % IV BOLUS
1000.0000 mL | Freq: Once | INTRAVENOUS | Status: AC
  Administered 2021-01-25: 1000 mL via INTRAVENOUS

## 2021-01-25 NOTE — ED Triage Notes (Addendum)
Pt BIB EMS due to aphasia. Pt was on the phone with her therapist and could not get her words out. The therapist called 911. When EMS got there, she was back to normal and had no neuro deficients at the moment. Pt has a hx of afib. Pt is axox4.  Last seen normal 2:30pm. Episode lasted about 20 min

## 2021-01-25 NOTE — ED Notes (Signed)
Code stroke cancelled per Sal, MD

## 2021-01-25 NOTE — Consult Note (Signed)
Neurology Consultation  Reason for Consult: Transient aphasia, concern for visual field deficit Referring Physician: Dr. Darl Householder  CC: Transient speech disturbance  History is obtained from: Patient, Chart review  HPI: Rose ECKMANN is a 75 y.o. female with a medial history significant for dyslipidemia, essential hypertension, morbid obesity with BMI of 31.31, and persistent atrial fibrillation on Pradaxa who presented to the ED 10/26 for evaluation of garbled speech noticed by her therapist today on the telephone. Ms. Frick states that while talking with her therapist on the phone at 14:30, she noticed that she was unable to speak clearly as she normally would and that her words sounded garbled. This lasted approximately 20 minutes before resolution. While in the ED, the EDP became concerned for a visual field deficit and contacted neurology for further evaluation.   LKW: She is at her baseline function on evaluation, all symptoms have resolved TNK given?: no, symptoms resolved IR Thrombectomy? No, presentation is not consistent with an LVO, there are no lasting symptoms present.  Modified Rankin Scale: 0-Completely asymptomatic and back to baseline post- stroke  ROS: A complete ROS was performed and is negative except as noted in the HPI.   Past Medical History:  Diagnosis Date   Attention deficit    on Vyvanse   Chronic anticoagulation    Depression    Dyslipidemia    Hypertension    Hypokalemia    Morbid obesity ([place])    Palpitations    Persistent atrial fibrillation ([place]) 05/27/14 Chadsvasc score of 4   s/p cardioversion Sept 2012; failed   Past Surgical History:  Procedure Laterality Date   BIOPSY  01/02/2018   Procedure: BIOPSY;  Surgeon: Mauri Pole, MD;  Location: [facility] ENDOSCOPY;  Service: Endoscopy;;   BREAST BIOPSY     CARDIOVERSION  Sept 2012   failed   CARDIOVERSION N/A 09/08/2012   Procedure: CARDIOVERSION;  Surgeon: Fay Records, MD;  Location: [place];  Service: Cardiovascular;  Laterality: N/A;   CARDIOVERSION N/A 02/15/2013   Procedure: CARDIOVERSION-BEDSIDE;  Surgeon: Darlin Coco, MD;  Location: [place];  Service: Cardiovascular;  Laterality: N/A;   CARDIOVERSION N/A 02/05/2011   Procedure: CARDIOVERSION;  Surgeon: Evans Lance, MD;  Location: [facility] CATH LAB;  Service: Cardiovascular;  Laterality: N/A;   COLONOSCOPY N/A 01/03/2018   Procedure: COLONOSCOPY;  Surgeon: Mauri Pole, MD;  Location: [place] ENDOSCOPY;  Service: Endoscopy;  Laterality: N/A;   Convergent procedure  11/13/11   afib ablation at [place] N/A 02/09/2015   Procedure: Loop Recorder Removal;  Surgeon: [name], MD;  Location: [place] CV LAB;  Service: Cardiovascular;  Laterality: N/A;   ESOPHAGOGASTRODUODENOSCOPY (EGD) WITH PROPOFOL N/A 01/02/2018   Procedure: ESOPHAGOGASTRODUODENOSCOPY (EGD) WITH PROPOFOL;  Surgeon: Mauri Pole, MD;  Location: [place] ENDOSCOPY;  Service: Endoscopy;  Laterality: N/A;   loop recorder insertion Left 09/2011   Loop recorder removal  02/09/15   MDT Reveal XT loop recorder removed by Dr Rayann Heman   POLYPECTOMY  01/03/2018   Procedure: POLYPECTOMY;  Surgeon: Mauri Pole, MD;  Location: [place];  Service: Endoscopy;;   TEE WITHOUT CARDIOVERSION N/A 09/08/2012   Procedure: TRANSESOPHAGEAL ECHOCARDIOGRAM (TEE);  Surgeon: Fay Records, MD;  Location: [facility] ENDOSCOPY;  Service: Cardiovascular;  Laterality: N/A;   TONSILLECTOMY     TONSILLECTOMY     US ECHOCARDIOGRAPHY  Aug 9622   Normal systolic function with EF 55% and slight LAE at 4.4cm   Family History  Problem Relation Age of Onset   Coronary artery disease Father    Social History:   reports that she has never smoked. She has never used smokeless tobacco. She reports that she does not drink alcohol and does not use drugs.  Medications No current facility-administered medications for this encounter.  Current Outpatient Medications:     amphetamine-dextroamphetamine (ADDERALL) 15 MG tablet, Take 15 mg by mouth daily as needed (for additional focus). , Disp: , Rfl:    atorvastatin (LIPITOR) 10 MG tablet, Take 1 tablet (10 mg total) by mouth daily., Disp: 30 tablet, Rfl: 6   dabigatran (PRADAXA) 150 MG CAPS capsule, TAKE 1 CAPSULE BY MOUTH TWICE A DAY, Disp: 60 capsule, Rfl: 6   escitalopram (LEXAPRO) 20 MG tablet, Take 20 mg by mouth Daily. Lexapro, Disp: , Rfl:    metoprolol tartrate (LOPRESSOR) 50 MG tablet, TAKE 1 TABLET BY MOUTH TWICE A DAY, Disp: 180 tablet, Rfl: 1   rOPINIRole (REQUIP) 1 MG tablet, Take 1 mg by mouth at bedtime., Disp: , Rfl:    VYVANSE 70 MG capsule, Take 70 mg by mouth Daily. , Disp: , Rfl:   Exam: Current vital signs: BP (!) 142/104   Pulse 76   Temp 98 F (36.7 C) (Oral)   Resp (!) 21   Ht 5\' 9"  (1.753 m)   Wt 96.2 kg   SpO2 98%   BMI 31.31 kg/m  Vital signs in last 24 hours: Temp:  [98 F (36.7 C)] 98 F (36.7 C) (10/26 1617) Pulse Rate:  [74-76] 76 (10/26 1630) Resp:  [11-21] 21 (10/26 1630) BP: (142-148)/(104-122) 142/104 (10/26 1630) SpO2:  [97 %-98 %] 98 % (10/26 1630) Weight:  [96.2 kg] 96.2 kg (10/26 1618)  GENERAL: Awake, alert, in no acute distress Psych: Affect appropriate for situation, patient is calm and cooperative with examination Head: Normocephalic and atraumatic, without obvious abnormality EENT: Normal conjunctivae, dry mucous membranes, no OP obstruction LUNGS: Normal respiratory effort. Non-labored breathing on room air CV: Regular rate and rhythm on telemetry ABDOMEN: Soft, non-tender, non-distended Extremities: warm, well perfused, without obvious deformity  NEURO:  Mental Status: Awake, alert, and oriented to person, place, time, and situation. She is able to provide a clear and coherent history of present illness. Speech/Language: speech is intact without dysarthria.   Naming, repetition, fluency, and comprehension intact without aphasia  No neglect is  noted Cranial Nerves:  II: PERRL. Visual fields full.  III, IV, VI: EOMI without ptosis, nystagmus, or gaze preference.  V: Sensation is intact to light touch and symmetrical to face.  VII: Face is symmetric resting and smiling.  VIII: Hearing is intact to voice IX, X: Palate elevation is symmetric. Phonation normal.  XI: Normal sternocleidomastoid and trapezius muscle strength XII: Tongue protrudes midline without fasciculations.   Motor: 5/5 strength is all muscle groups without vertical drift throughout.  Tone is normal. Bulk is normal.  Sensation: Intact to light touch bilaterally in all four extremities. No extinction to DSS present.  Coordination: FTN intact bilaterally. HKS intact bilaterally. No pronator drift. DTRs: 2+ and symmetric throughout Gait: Deferred  NIHSS: 0  Labs I have reviewed labs in epic and the results pertinent to this consultation are: CBC    Component Value Date/Time   WBC 7.0 08/03/2020 1619   RBC 4.85 08/03/2020 1619   HGB 13.9 08/03/2020 1619   HCT 44.0 08/03/2020 1619   PLT 277 08/03/2020 1619   MCV 90.7 08/03/2020 1619   MCH 28.7 08/03/2020 1619  MCHC 31.6 08/03/2020 1619   RDW 13.2 08/03/2020 1619   LYMPHSABS 1.8 10/15/2018 1606   MONOABS 0.7 10/15/2018 1606   EOSABS 0.2 10/15/2018 1606   BASOSABS 0.0 10/15/2018 1606   CMP     Component Value Date/Time   NA 142 08/03/2020 1619   K 5.3 (H) 08/03/2020 1619   CL 109 08/03/2020 1619   CO2 26 08/03/2020 1619   GLUCOSE 98 08/03/2020 1619   BUN 31 (H) 08/03/2020 1619   CREATININE 2.05 (H) 08/03/2020 1619   CREATININE 1.22 (H) 02/02/2015 1239   CALCIUM 9.0 08/03/2020 1619   PROT 6.8 08/03/2020 1619   ALBUMIN 3.4 (L) 08/03/2020 1619   AST 21 08/03/2020 1619   ALT 13 08/03/2020 1619   ALKPHOS 104 08/03/2020 1619   BILITOT 0.7 08/03/2020 1619   GFRNONAA 25 (L) 08/03/2020 1619   GFRAA 40 (L) 12/24/2019 1238   Lipid Panel     Component Value Date/Time   CHOL 157 12/24/2019 1238    TRIG 70 12/24/2019 1238   HDL 55 12/24/2019 1238   CHOLHDL 2.9 12/24/2019 1238   VLDL 14 12/24/2019 1238   LDLCALC 88 12/24/2019 1238  No results found for: HGBA1C  Imaging I have reviewed the images obtained:  CT angio head and neck of the brain pending  MRI examination of the brain pending  Assessment: 75 y.o. female who presented to the ED 10/26 for evaluation of a 20 minute transient episode of aphasia today while talking on the telephone. There was some concern for a visual field deficit while in the ED but on neurology evaluation, patient did not have any neurologic deficit.  - Examination is without neurologic deficit with an NIHSS of 0. - Presentation is most consistent with a transient ischemic attack versus lacunar infarction. Will complete stroke work up for further evaluation.  - Stroke risk factors include patient's advanced age, atrial fibrillation, obesity, dyslipidemia, and HTN.   Recommendations: - HgbA1c, fasting lipid panel - MRI, MRA of the brain without contrast - Frequent neuro checks - Prophylactic therapy- Continue home Pradaxa - Risk factor modification - Telemetry monitoring - PT consult, OT consult, Speech consult - Stroke team to follow  Anibal Henderson, AGAC-NP [facility] Pager: [phone]   NEUROHOSPITALIST ADDENDUM Performed a face to face diagnostic evaluation.   I have reviewed the contents of history and physical exam as documented by PA/ARNP/Resident and agree with above documentation.  I have discussed and formulated the above plan as documented. Edits to the note have been made as needed.  Reported episode of aphasia, resolved. Some concern for a R UQ vision deficit but none noted on exam. IS compliant with Pradaxa. Likely a TIA. Will need stroke workup.  Donnetta Simpers, MD [facility] [phone]   If 7pm to 7am, please call on call as listed on AMION.

## 2021-01-25 NOTE — ED Provider Notes (Signed)
[facility] EMERGENCY DEPARTMENT Provider Note   CSN: [id number] Arrival date & time: 01/25/21  1608     History Chief Complaint  Patient presents with   Aphasia    Rose Ball is a 74 y.o. female hx of depression, HTN, afib on pradaxa, here presenting with trouble speaking.  Patient states that she was talking to her therapist around 2:30 and around 2:35, she states that her speech was slurred and she has trouble speaking.  She was trying to get words out but words will not come out.  Denies any weakness or numbness or blurry vision or loss of vision.  Patient states that her symptoms have resolved now.  She is on Pradaxa for A. fib.  No history of stroke in the past.  Of note, patient also fell yesterday when she was gardening and has some bruising in the left rib area and also left pelvis  The history is provided by the patient.      Past Medical History:  Diagnosis Date   Attention deficit    on Vyvanse   Chronic anticoagulation    Depression    Dyslipidemia    Hypertension    Hypokalemia    Morbid obesity ([place])    Palpitations    Persistent atrial fibrillation ([place]) 05/27/14 Chadsvasc score of 4   s/p cardioversion Sept 2012; failed    Patient Active Problem List   Diagnosis Date Noted   Family history of colon cancer in mother    Polyp of ascending colon    UGIB (upper gastrointestinal bleed) 01/01/2018   Blood loss anemia 01/01/2018   Preop cardiovascular exam 06/03/2013   Chronic anticoagulation, on Pradaxa 02/16/2013   Persistent atrial fibrillation ([place])    Obesity 12/29/2010   Attention deficit disorder with hyperactivity syndrome 08/30/2010   Hypertension    Dyslipidemia    Long term (current) use of anticoagulants 07/21/2010    Past Surgical History:  Procedure Laterality Date   BIOPSY  01/02/2018   Procedure: BIOPSY;  Surgeon: Mauri Pole, MD;  Location: [facility] ENDOSCOPY;  Service: Endoscopy;;   BREAST BIOPSY      CARDIOVERSION  Sept 2012   failed   CARDIOVERSION N/A 09/08/2012   Procedure: CARDIOVERSION;  Surgeon: Fay Records, MD;  Location: [place];  Service: Cardiovascular;  Laterality: N/A;   CARDIOVERSION N/A 02/15/2013   Procedure: CARDIOVERSION-BEDSIDE;  Surgeon: Darlin Coco, MD;  Location: [place];  Service: Cardiovascular;  Laterality: N/A;   CARDIOVERSION N/A 02/05/2011   Procedure: CARDIOVERSION;  Surgeon: Evans Lance, MD;  Location: [facility] CATH LAB;  Service: Cardiovascular;  Laterality: N/A;   COLONOSCOPY N/A 01/03/2018   Procedure: COLONOSCOPY;  Surgeon: Mauri Pole, MD;  Location: [place] ENDOSCOPY;  Service: Endoscopy;  Laterality: N/A;   Convergent procedure  11/13/11   afib ablation at [place] N/A 02/09/2015   Procedure: Loop Recorder Removal;  Surgeon: [name], MD;  Location: [place] CV LAB;  Service: Cardiovascular;  Laterality: N/A;   ESOPHAGOGASTRODUODENOSCOPY (EGD) WITH PROPOFOL N/A 01/02/2018   Procedure: ESOPHAGOGASTRODUODENOSCOPY (EGD) WITH PROPOFOL;  Surgeon: Mauri Pole, MD;  Location: [place] ENDOSCOPY;  Service: Endoscopy;  Laterality: N/A;   loop recorder insertion Left 09/2011   Loop recorder removal  02/09/15   MDT Reveal XT loop recorder removed by Dr Rayann Heman   POLYPECTOMY  01/03/2018   Procedure: POLYPECTOMY;  Surgeon: Mauri Pole, MD;  Location: [place];  Service: Endoscopy;;   TEE WITHOUT  CARDIOVERSION N/A 09/08/2012   Procedure: TRANSESOPHAGEAL ECHOCARDIOGRAM (TEE);  Surgeon: Fay Records, MD;  Location: [facility] ENDOSCOPY;  Service: Cardiovascular;  Laterality: N/A;   TONSILLECTOMY     TONSILLECTOMY     US ECHOCARDIOGRAPHY  Aug 4008   Normal systolic function with EF 55% and slight LAE at 4.4cm     OB History   No obstetric history on file.     Family History  Problem Relation Age of Onset   Coronary artery disease Father     Social History   Tobacco Use   Smoking status: Never   Smokeless tobacco: Never   Substance Use Topics   Alcohol use: No   Drug use: No    Home Medications Prior to Admission medications   Medication Sig Start Date End Date Taking? Authorizing Provider  amphetamine-dextroamphetamine (ADDERALL) 15 MG tablet Take 15 mg by mouth daily as needed (for additional focus).  04/02/11   [provider]  atorvastatin (LIPITOR) 10 MG tablet Take 1 tablet (10 mg total) by mouth daily. 08/08/20   Sherran Needs, NP  dabigatran (PRADAXA) 150 MG CAPS capsule TAKE 1 CAPSULE BY MOUTH TWICE A DAY 08/08/20   Sherran Needs, NP  escitalopram (LEXAPRO) 20 MG tablet Take 20 mg by mouth Daily. Lexapro 07/07/10   [provider]  metoprolol tartrate (LOPRESSOR) 50 MG tablet TAKE 1 TABLET BY MOUTH TWICE A DAY 11/10/20   Sherran Needs, NP  rOPINIRole (REQUIP) 1 MG tablet Take 1 mg by mouth at bedtime.    [provider]  VYVANSE 70 MG capsule Take 70 mg by mouth Daily.  07/11/10   [provider]    Allergies    Amiodarone  Review of Systems   Review of Systems  Neurological:  Positive for speech difficulty.  All other systems reviewed and are negative.  Physical Exam Updated Vital Signs BP (!) 148/122   Pulse 74   Temp 98 F (36.7 C) (Oral)   Resp 19   Ht 5\' 9"  (1.753 m)   Wt 96.2 kg   SpO2 97%   BMI 31.31 kg/m   Physical Exam Vitals and nursing note reviewed.  Constitutional:      Appearance: Normal appearance.  HENT:     Head: Normocephalic.     Nose: Nose normal.     Mouth/Throat:     Mouth: Mucous membranes are moist.  Eyes:     Extraocular Movements: Extraocular movements intact.     Pupils: Pupils are equal, round, and reactive to light.     Comments: ? R superior hemianopsia bilateral eyes  Cardiovascular:     Rate and Rhythm: Normal rate and regular rhythm.  Pulmonary:     Effort: Pulmonary effort is normal.     Breath sounds: Normal breath sounds.     Comments: Bruising in the left lower ribs Abdominal:     General:  Abdomen is flat.     Palpations: Abdomen is soft.  Genitourinary:    Comments: Abrasion and ecchymosis in the left pelvis but she is able to range her hip. Musculoskeletal:        General: Normal range of motion.     Cervical back: Normal range of motion and neck supple.  Skin:    General: Skin is warm.     Capillary Refill: Capillary refill takes less than 2 seconds.  Neurological:     General: No focal deficit present.     Mental Status: She is alert  and oriented to person, place, and time.  Psychiatric:        Mood and Affect: Mood normal.        Behavior: Behavior normal.    ED Results / Procedures / Treatments   Labs (all labs ordered are listed, but only abnormal results are displayed) Labs Reviewed  RESP PANEL BY RT-PCR (FLU A&B, COVID) ARPGX2  ETHANOL  PROTIME-INR  APTT  CBC  DIFFERENTIAL  COMPREHENSIVE METABOLIC PANEL  RAPID URINE DRUG SCREEN, HOSP PERFORMED  URINALYSIS, ROUTINE W REFLEX MICROSCOPIC  I-STAT CHEM 8, ED    EKG None  Radiology No results found.  Procedures Procedures   Medications Ordered in ED Medications - No data to display  ED Course  I have reviewed the triage vital signs and the nursing notes.  Pertinent labs & imaging results that were available during my care of the patient were reviewed by me and considered in my medical decision making (see chart for details).    MDM Rules/Calculators/A&P                          JORDANE HISLE is a 75 y.o. female here presenting with trouble speaking.  Acute onset around 2:35 PM.  Symptoms have resolved now.  However I noticed that she has right upper Hemianopsia bilaterally.  I talked to Dr. Lorrin Goodell.  He recommend calling code stroke as patient has neurodeficit.  We will to get a CTA head.   4:50 PM Code stroke canceled by neurology. Cr is 2.1 so can't get CTA. Will get CT head noncontrast and get MRI brain and MRA brain  8:40 PM MRI showed no stroke.  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  There is no obvious rupture.  I discussed case with Dr. Leonel Ramsay.  He states that patient can be discharged and can follow-up with neuro interventionalist to coil the aneurysm. We will have her follow-up with neurology as well.  She is already on Eliquis    Final Clinical Impression(s) / ED Diagnoses Final diagnoses:  None    Rx / DC Orders ED Discharge Orders     None        Drenda Freeze, MD 01/25/21 2043

## 2021-01-25 NOTE — Progress Notes (Signed)
  Echocardiogram 2D Echocardiogram has been performed.  Merrie Roof F 01/25/2021, 5:52 PM

## 2021-01-25 NOTE — ED Notes (Signed)
Patient transported to MRI 

## 2021-01-25 NOTE — Discharge Instructions (Signed)
Continue your current meds.  You likely have a TIA  You also have a cerebral aneurysm.  Interventional radiology should contact you but if they do not contact you next week, please call them.  You should follow-up with neurologist as well.  Return to ER if you have another episode of trouble speaking, weakness, numbness, dizziness

## 2021-01-26 ENCOUNTER — Telehealth (HOSPITAL_COMMUNITY): Payer: Self-pay

## 2021-01-26 ENCOUNTER — Other Ambulatory Visit (HOSPITAL_COMMUNITY): Payer: Self-pay | Admitting: Neuroradiology

## 2021-01-26 DIAGNOSIS — I671 Cerebral aneurysm, nonruptured: Secondary | ICD-10-CM

## 2021-01-26 NOTE — Telephone Encounter (Signed)
Called to schedule consult with Dr. Rodrigues, no answer, left vm. AW  

## 2021-01-30 ENCOUNTER — Telehealth (HOSPITAL_COMMUNITY): Payer: Self-pay

## 2021-01-30 NOTE — Telephone Encounter (Signed)
Called to schedule consult with Dr. Rodrigues, no answer, left vm. AW  

## 2021-02-07 ENCOUNTER — Ambulatory Visit (HOSPITAL_COMMUNITY): Admission: RE | Admit: 2021-02-07 | Payer: PPO | Source: Ambulatory Visit

## 2021-02-08 ENCOUNTER — Telehealth (HOSPITAL_COMMUNITY): Payer: Self-pay

## 2021-02-08 NOTE — Telephone Encounter (Signed)
Called to reschedule consult, no answer, left vm. AW  

## 2021-02-09 ENCOUNTER — Ambulatory Visit: Payer: PPO | Admitting: Neurology

## 2021-02-09 ENCOUNTER — Other Ambulatory Visit: Payer: Self-pay

## 2021-02-09 ENCOUNTER — Encounter: Payer: Self-pay | Admitting: Neurology

## 2021-02-09 VITALS — BP 154/102 | HR 108 | Ht 69.0 in | Wt 217.0 lb

## 2021-02-09 DIAGNOSIS — G459 Transient cerebral ischemic attack, unspecified: Secondary | ICD-10-CM

## 2021-02-09 DIAGNOSIS — I671 Cerebral aneurysm, nonruptured: Secondary | ICD-10-CM

## 2021-02-09 NOTE — Progress Notes (Signed)
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  Patient said on October 26 she was on the phone with her therapist and had sudden onset of slurred speech.  Stated that therapist could not understand her and this lasted for about a minute.  She was directed to the hospital and was admitted for TIA work-up.  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  She was referred to interventional radiology but missed her appointment.  Her echocardiogram was completed which showed EF of 55 to 60%.  She is on Pradaxa for A. fib, that medication was continued.  Patient states since leaving the hospital she has not had any additional symptoms of word finding difficulty but she mentioned a month ago she had right hand numbness.  Denies any previous history of strokes.  She reports compliance with her medications.    OTHER MEDICAL CONDITIONS: HTN, RLS, Atrial fibrillation, Depression, ADD, HLD.    REVIEW OF SYSTEMS: Full 14 system review of systems performed and negative with exception of: as noted in the HPI   ALLERGIES: Allergies  Allergen Reactions   Amiodarone Other (See Comments)    REACTION: dyspnea    HOME MEDICATIONS: Outpatient Medications Prior to Visit  Medication Sig Dispense Refill    amphetamine-dextroamphetamine (ADDERALL) 15 MG tablet Take 15 mg by mouth daily as needed (for additional focus).      atorvastatin (LIPITOR) 10 MG tablet Take 1 tablet (10 mg total) by mouth daily. 30 tablet 6   dabigatran (PRADAXA) 150 MG CAPS capsule TAKE 1 CAPSULE BY MOUTH TWICE A DAY 60 capsule 6   escitalopram (LEXAPRO) 20 MG tablet Take 20 mg by mouth Daily. Lexapro     metoprolol tartrate (LOPRESSOR) 50 MG tablet TAKE 1 TABLET BY MOUTH TWICE A DAY 180 tablet 1   rOPINIRole (REQUIP) 1 MG tablet Take 1 mg by mouth at bedtime.     VYVANSE 70 MG capsule Take 70 mg by mouth Daily.      No facility-administered medications prior to visit.    PAST MEDICAL HISTORY: Past Medical History:  Diagnosis Date   Attention deficit    on Vyvanse   Chronic anticoagulation    Depression    Dyslipidemia    Hypertension    Hypokalemia    Morbid obesity ([place])    Palpitations    Persistent atrial fibrillation ([place]) 05/27/14 Chadsvasc score of 4   s/p cardioversion Sept 2012; failed    PAST SURGICAL HISTORY: Past Surgical History:  Procedure Laterality Date   BIOPSY  01/02/2018   Procedure: BIOPSY;  Surgeon: Mauri Pole, MD;  Location: [facility] ENDOSCOPY;  Service: Endoscopy;;   BREAST BIOPSY     CARDIOVERSION  Sept 2012   failed   CARDIOVERSION N/A 09/08/2012   Procedure: CARDIOVERSION;  Surgeon: Nevin Bloodgood  Alcus Dad, MD;  Location: [facility] ENDOSCOPY;  Service: Cardiovascular;  Laterality: N/A;   CARDIOVERSION N/A 02/15/2013   Procedure: CARDIOVERSION-BEDSIDE;  Surgeon: Darlin Coco, MD;  Location: [place];  Service: Cardiovascular;  Laterality: N/A;   CARDIOVERSION N/A 02/05/2011   Procedure: CARDIOVERSION;  Surgeon: Evans Lance, MD;  Location: [facility] CATH LAB;  Service: Cardiovascular;  Laterality: N/A;   COLONOSCOPY N/A 01/03/2018   Procedure: COLONOSCOPY;  Surgeon: Mauri Pole, MD;  Location: [place] ENDOSCOPY;  Service: Endoscopy;  Laterality: N/A;   Convergent procedure  11/13/11   afib ablation  at [place] N/A 02/09/2015   Procedure: Loop Recorder Removal;  Surgeon: [name], MD;  Location: [place] CV LAB;  Service: Cardiovascular;  Laterality: N/A;   ESOPHAGOGASTRODUODENOSCOPY (EGD) WITH PROPOFOL N/A 01/02/2018   Procedure: ESOPHAGOGASTRODUODENOSCOPY (EGD) WITH PROPOFOL;  Surgeon: Mauri Pole, MD;  Location: [place] ENDOSCOPY;  Service: Endoscopy;  Laterality: N/A;   loop recorder insertion Left 09/2011   Loop recorder removal  02/09/15   MDT Reveal XT loop recorder removed by Dr Rayann Heman   POLYPECTOMY  01/03/2018   Procedure: POLYPECTOMY;  Surgeon: Mauri Pole, MD;  Location: [place];  Service: Endoscopy;;   TEE WITHOUT CARDIOVERSION N/A 09/08/2012   Procedure: TRANSESOPHAGEAL ECHOCARDIOGRAM (TEE);  Surgeon: Fay Records, MD;  Location: [facility] ENDOSCOPY;  Service: Cardiovascular;  Laterality: N/A;   TONSILLECTOMY     TONSILLECTOMY     US ECHOCARDIOGRAPHY  Aug 6237   Normal systolic function with EF 55% and slight LAE at 4.4cm    FAMILY HISTORY: Family History  Problem Relation Age of Onset   Coronary artery disease Father     SOCIAL HISTORY: Social History   Socioeconomic History   Marital status: Married    Spouse name: Not on file   Number of children: Not on file   Years of education: Not on file   Highest education level: Not on file  Occupational History   Not on file  Tobacco Use   Smoking status: Never   Smokeless tobacco: Never  Substance and Sexual Activity   Alcohol use: No   Drug use: No   Sexual activity: Not on file  Other Topics Concern   Not on file  Social History Narrative   Pt lives in [place] alone.  Works at [employer] Strain: Not on [employer] Insecurity: Not on file  Transportation Needs: Not on file  Physical Activity: Not on file  Stress: Not on file  Social Connections: Not on file  Intimate Partner Violence: Not on  file     PHYSICAL EXAM  GENERAL EXAM/CONSTITUTIONAL: Vitals:  Vitals:   02/09/21 1453  BP: (!) 154/102  Pulse: (!) 108  Weight: 217 lb (98.4 kg)  Height: 5\' 9"  (1.753 m)   Body mass index is 32.05 kg/m. Wt Readings from Last 3 Encounters:  02/09/21 217 lb (98.4 kg)  01/25/21 212 lb (96.2 kg)  08/03/20 217 lb 9.6 oz (98.7 kg)   Patient is in no distress; well developed, nourished and groomed; neck is supple  CARDIOVASCULAR: Examination of carotid arteries is normal; no carotid bruits Regular rate and rhythm, no murmurs Examination of peripheral vascular system by observation and palpation is normal  EYES: Pupils round and reactive to light, Visual fields full to confrontation, Extraocular movements intacts,   MUSCULOSKELETAL: Gait, strength, tone, movements noted in Neurologic exam below  NEUROLOGIC:  MENTAL STATUS:  No flowsheet data found. awake, alert, oriented to person, place and time recent and remote memory intact normal attention and concentration language fluent, comprehension intact, naming intact fund of knowledge appropriate  CRANIAL NERVE:  2nd, 3rd, 4th, 6th - pupils equal and reactive to light, visual fields full to confrontation, extraocular muscles intact, no nystagmus 5th - facial sensation symmetric 7th - facial strength symmetric 8th - hearing intact 9th - palate elevates symmetrically, uvula midline 11th - shoulder shrug symmetric 12th - tongue protrusion midline  MOTOR:  normal bulk and tone, full strength in the BUE, BLE  SENSORY:  normal and symmetric to light touch, pinprick, temperature, vibration  COORDINATION:  finger-nose-finger, fine finger movements normal  REFLEXES:  deep tendon reflexes present and symmetric  GAIT/STATION:  normal    DIAGNOSTIC DATA (LABS, IMAGING, TESTING) - I reviewed patient records, labs, notes, testing and imaging myself where available.  Lab Results  Component Value Date   WBC 6.8  01/25/2021   HGB 14.3 01/25/2021   HCT 42.0 01/25/2021   MCV 87.3 01/25/2021   PLT 298 01/25/2021      Component Value Date/Time   NA 138 01/25/2021 1640   K 4.7 01/25/2021 1640   CL 106 01/25/2021 1640   CO2 26 01/25/2021 1626   GLUCOSE 96 01/25/2021 1640   BUN 40 (H) 01/25/2021 1640   CREATININE 2.10 (H) 01/25/2021 1640   CREATININE 1.22 (H) 02/02/2015 1239   CALCIUM 8.7 (L) 01/25/2021 1626   PROT 6.5 01/25/2021 1626   ALBUMIN 3.2 (L) 01/25/2021 1626   AST 27 01/25/2021 1626   ALT 13 01/25/2021 1626   ALKPHOS 85 01/25/2021 1626   BILITOT 0.6 01/25/2021 1626   GFRNONAA 23 (L) 01/25/2021 1626   GFRAA 40 (L) 12/24/2019 1238   Lab Results  Component Value Date   CHOL 157 12/24/2019   HDL 55 12/24/2019   LDLCALC 88 12/24/2019   TRIG 70 12/24/2019   CHOLHDL 2.9 12/24/2019   No results found for: HGBA1C No results found for: VITAMINB12 Lab Results  Component Value Date   TSH 1.07 05/27/2014   Head CT  No acute intracranial hemorrhage or evidence of acute infarction. Chronic microvascular ischemic changes. Subcentimeter peripherally calcified lesion at the right aspect of the sella. CTA or MRA of the head is recommended to exclude aneurysm  MRI Brain:  1. No acute intracranial pathology. 2. Moderate global parenchymal volume loss and advanced chronic white matter microangiopathy. 3. Right mastoid effusion.  MRA Head and Neck: 1. Negative MRA of the head and neck for large vessel occlusion. No hemodynamically significant or correctable stenosis. 2. 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 This would likely be amenable to endovascular therapy. 3. Fetal type origin of the PCAs with overall diminutive vertebrobasilar system, a normal anatomic variant.   Echocardiogram  1. Left ventricular ejection fraction, by estimation, is 55 to 60%. The left ventricle has normal function. The left ventricle has  no regional wall motion abnormalities. There is mild left ventricular hypertrophy. Left ventricular diastolic parameters are indeterminate.  2. Right ventricular systolic function is normal. The right ventricular size is normal. Tricuspid regurgitation signal is inadequate for assessing PA pressure.  3. Left atrial size was mildly dilated.  4. The mitral valve is normal in structure. No evidence of mitral valve regurgitation. No evidence of mitral stenosis.  5. The aortic valve is tricuspid. Aortic valve regurgitation  is trivial. No aortic stenosis is present.  6. The inferior vena cava is normal in size with greater than 50% respiratory variability, suggesting right atrial pressure of 3 mmHg.  7. The patient was in atrial fibrillation    ASSESSMENT AND PLAN  75 y.o. year old female with past medical history of atrial fibrillation, hypertension, hyperlipidemia, depression who is presenting after being admitted to the hospital for TIA.  During the work-up she was found to have a 8 x 8 x 10 mm aneurysm arising from the cavernous right ICA.  She had missed her initial appointment with the interventional radiologist but a new referral was given to her.  I have strongly advised her to follow-up with the interventional radiologist as scheduled as this is an urgent matter.  She is already on a blood thinner and she has hypertension and she needs to have the aneurysm evaluated and possibly coiled/embolized.  Continue all other medication.  I will see her in 1 year for follow-up. I have also sent a referral to geriatric as she does not have a PMD.    1. TIA (transient ischemic attack)   2. Cerebral aneurysm     PLAN: Follow up with Interventional Radiologist Dr. Karenann Cai as scheduled (New referral given patient missed her previous appointment)  Continue current medications  Follow up in one year   Orders Placed This Encounter  Procedures   Ambulatory referral to Interventional Radiology    Ambulatory referral to Geriatrics    No orders of the defined types were placed in this encounter.   Return in about 1 year (around 02/09/2022).    Alric Ran, MD 02/09/2021, 4:12 PM  [facility] [address], [place], [place] [address] [phone]

## 2021-02-09 NOTE — Patient Instructions (Signed)
Follow up with Interventional Radiologist Dr. Karenann Cai as scheduled (New referral given patient missed her previous appointment)  Continue current medications  Follow up in one year

## 2021-02-10 ENCOUNTER — Other Ambulatory Visit (HOSPITAL_COMMUNITY): Payer: Self-pay | Admitting: Nurse Practitioner

## 2021-03-10 ENCOUNTER — Other Ambulatory Visit (HOSPITAL_COMMUNITY): Payer: Self-pay | Admitting: Nurse Practitioner

## 2021-03-20 ENCOUNTER — Telehealth (HOSPITAL_COMMUNITY): Payer: Self-pay

## 2021-03-20 NOTE — Telephone Encounter (Signed)
Called to reschedule consult, no answer, left vm. AW  

## 2021-03-28 ENCOUNTER — Telehealth (HOSPITAL_COMMUNITY): Payer: Self-pay

## 2021-03-28 NOTE — Telephone Encounter (Signed)
Called to schedule consult, no answer, no vm. AW  

## 2021-04-09 ENCOUNTER — Other Ambulatory Visit (HOSPITAL_COMMUNITY): Payer: Self-pay | Admitting: Nurse Practitioner

## 2021-04-25 ENCOUNTER — Ambulatory Visit (HOSPITAL_COMMUNITY)
Admission: RE | Admit: 2021-04-25 | Discharge: 2021-04-25 | Disposition: A | Discharge status: Home / Self Care | Payer: PPO | Source: Ambulatory Visit | Attending: Nurse Practitioner | Admitting: Nurse Practitioner

## 2021-04-25 ENCOUNTER — Encounter (HOSPITAL_COMMUNITY): Payer: Self-pay | Admitting: Nurse Practitioner

## 2021-04-25 ENCOUNTER — Other Ambulatory Visit: Payer: Self-pay

## 2021-04-25 VITALS — BP 180/86 | HR 67 | Ht 69.0 in | Wt 217.0 lb

## 2021-04-25 DIAGNOSIS — N189 Chronic kidney disease, unspecified: Secondary | ICD-10-CM | POA: Insufficient documentation

## 2021-04-25 DIAGNOSIS — I4819 Other persistent atrial fibrillation: Secondary | ICD-10-CM | POA: Diagnosis present

## 2021-04-25 DIAGNOSIS — Z8673 Personal history of transient ischemic attack (TIA), and cerebral infarction without residual deficits: Secondary | ICD-10-CM | POA: Insufficient documentation

## 2021-04-25 DIAGNOSIS — E785 Hyperlipidemia, unspecified: Secondary | ICD-10-CM | POA: Diagnosis not present

## 2021-04-25 DIAGNOSIS — I129 Hypertensive chronic kidney disease with stage 1 through stage 4 chronic kidney disease, or unspecified chronic kidney disease: Secondary | ICD-10-CM | POA: Diagnosis not present

## 2021-04-25 DIAGNOSIS — Z7901 Long term (current) use of anticoagulants: Secondary | ICD-10-CM | POA: Diagnosis not present

## 2021-04-25 DIAGNOSIS — I10 Essential (primary) hypertension: Secondary | ICD-10-CM | POA: Diagnosis not present

## 2021-04-25 DIAGNOSIS — Z79899 Other long term (current) drug therapy: Secondary | ICD-10-CM | POA: Diagnosis not present

## 2021-04-25 MED ORDER — AMLODIPINE BESYLATE 5 MG PO TABS: 2.5000 mg | ORAL_TABLET | Freq: Every day | ORAL | 3 refills | Status: DC

## 2021-04-25 NOTE — Patient Instructions (Signed)
Start Amlodipine 1/2 tablet once a day    Follow up with interventional radiology

## 2021-04-25 NOTE — Progress Notes (Signed)
Patient ID: Rose Ball, female   DOB: 1945/10/27, 76 y.o.   MRN: [id number]     Primary Care Physician: Patient, No Rose Ball (Inactive) Referring Physician: Dr. Rayann Ball Neurology: Rose Ball is a 76 y.o. female with a h/o afib, s/p convergent procedure 2013, on tikosyn. She is in the clinic for f/u. She had   GI bleed, last October, was taking naprosyn with pradaxa for low back pain. Had presenting HGB of 6.9, received 3 units of blood. Was found to have a gastric ulcer, endoscopy and colonoscopy was done in the hospital. She was seen f/u by GI but has not had any further f/u of her blood count as she still does not have a Rose.  This in the past has been  stressed to the pt importance of getting established with one. She states that she is working on it.   She denies  feeling   weakness or shortness of breath with exertion as she did with the anemia.. She has remained in SR during this time. No further bleeding issues.   F/u in afib clinic, 05/19/19. Pt is here for Tikosyn surveillance. Reports no afib. Feels well. Still has not established with a Rose.   F/u in afib clinic, 12/24/19. Does not report any  afib. Compliant with Tikosyn and Pradaxa with a CHA2DS2VASc score of 4. Fasting blood work to f/u on refill of Lipitor as she has not established with a Rose. BP is elevated today. States she took BB just minutes ago in her car. No concerns today.   F/u in afib clinic, 08/03/20. No afib to report. On last visit, her Tikosyn dose was reduced for decline in kidney function.SHe was referred to nephrology and I think her function stabilized. Will check again today. In conversation, pt states that she has been opening the capsule of 500 mcg  tikosyn and spitting the capsules into 2 doses. Informed pt that this is dangerous and this is not recommended. She states she did not want to waste money . I will re send the 250 mcg capsule and repeat EKG when back on correct dose in 2-3  days.Ekg shows atrial flutter today at 97 bpm.    F/u in afib clinic, 04/25/21 for afib.I see in Epic  where she was sent to the ER 01/25/21 when she was talking to her therapist and could not get her words out.  MRI was done, dx with TIA and an aneurysm was found . She was discharged to f/u with Neurology and interventional radiologist. She did not go to the radiologist. When she saw Neurology, he urged her to make another appointment. I can see where his office tried twice to call her to schedule. I asked her today if she did , she replied she didn't and did not seem overly enthused to schedule the appointment. I urged her to do this again today based on the concerns of Rose. April Ball.. I had also referred her to nephrology in the past  for her CKD and she no longer sees them.  She has no Rose to monitor her BP/kidney function/other primary issues. She has been asked numerous times to establish with a Rose. She is in SR today but states that she will be often in afib. Tikosyn had to be stopped for elevated creatinine and prolonged  qt interval. She remains on pradaxa, renal function calculated to crcl/min and she remains on the appropriate dose. BP is very elevated today at 180/86.  She does not monitor  at home.   Today, she denies symptoms of palpitations, chest pain,  orthopnea, PND, lower extremity edema, dizziness, presyncope, syncope, or neurologic sequela.  The patient is tolerating medications without difficulties and is otherwise without complaint today.   Past Medical History:  Diagnosis Date   Attention deficit    on Vyvanse   Chronic anticoagulation    Depression    Dyslipidemia    Hypertension    Hypokalemia    Morbid obesity ([place])    Palpitations    Persistent atrial fibrillation ([place]) 05/27/14 Chadsvasc score of 4   s/p cardioversion Sept 2012; failed   Past Surgical History:  Procedure Laterality Date   BIOPSY  01/02/2018   Procedure: BIOPSY;  Surgeon: Rose Pole, Ball;   Location: [facility] ENDOSCOPY;  Service: Endoscopy;;   BREAST BIOPSY     CARDIOVERSION  Sept 2012   failed   CARDIOVERSION N/A 09/08/2012   Procedure: CARDIOVERSION;  Surgeon: Rose Ball;  Location: [place];  Service: Cardiovascular;  Laterality: N/A;   CARDIOVERSION N/A 02/15/2013   Procedure: CARDIOVERSION-BEDSIDE;  Surgeon: Rose Coco, Ball;  Location: [place];  Service: Cardiovascular;  Laterality: N/A;   CARDIOVERSION N/A 02/05/2011   Procedure: CARDIOVERSION;  Surgeon: Rose Lance, Ball;  Location: [facility] CATH LAB;  Service: Cardiovascular;  Laterality: N/A;   COLONOSCOPY N/A 01/03/2018   Procedure: COLONOSCOPY;  Surgeon: Rose Pole, Ball;  Location: [place] ENDOSCOPY;  Service: Endoscopy;  Laterality: N/A;   Convergent procedure  11/13/11   afib ablation at [place] N/A 02/09/2015   Procedure: Loop Recorder Removal;  Surgeon: Rose Grayer, Ball;  Location: [place] CV LAB;  Service: Cardiovascular;  Laterality: N/A;   ESOPHAGOGASTRODUODENOSCOPY (EGD) WITH PROPOFOL N/A 01/02/2018   Procedure: ESOPHAGOGASTRODUODENOSCOPY (EGD) WITH PROPOFOL;  Surgeon: Rose Pole, Ball;  Location: [facility] ENDOSCOPY;  Service: Endoscopy;  Laterality: N/A;   loop recorder insertion Left 09/2011   Loop recorder removal  02/09/15   MDT Reveal XT loop recorder removed by Rose Ball   POLYPECTOMY  01/03/2018   Procedure: POLYPECTOMY;  Surgeon: Rose Pole, Ball;  Location: [place];  Service: Endoscopy;;   TEE WITHOUT CARDIOVERSION N/A 09/08/2012   Procedure: TRANSESOPHAGEAL ECHOCARDIOGRAM (TEE);  Surgeon: Rose Ball;  Location[facility] ENDOSCOPY;  Service: Cardiovascular;  Laterality: N/A;   TONSILLECTOMY     TONSILLECTOMY     US ECHOCARDIOGRAPHY  Aug 7253   Normal systolic function with EF 55% and slight LAE at 4.4cm    Current Outpatient Medications  Medication Sig Dispense Refill   amphetamine-dextroamphetamine (ADDERALL) 15 MG tablet Take 15 mg by mouth daily as needed (for  additional focus).      atorvastatin (LIPITOR) 10 MG tablet Take 1 tablet (10 mg total) by mouth daily. Appointment Required For Further Refills [phone] 30 tablet 0   dabigatran (PRADAXA) 150 MG CAPS capsule TAKE 1 CAPSULE BY MOUTH TWICE A DAY. KEEP UPCOMING APPOINTMENT. 60 capsule 0   escitalopram (LEXAPRO) 20 MG tablet Take 20 mg by mouth Daily. Lexapro     metoprolol tartrate (LOPRESSOR) 50 MG tablet TAKE 1 TABLET BY MOUTH TWICE A DAY 180 tablet 1   rOPINIRole (REQUIP) 1 MG tablet Take 1 mg by mouth at bedtime.     VYVANSE 70 MG capsule Take 70 mg by mouth Daily.      No current facility-administered medications for this encounter.    Allergies  Allergen Reactions   Amiodarone  Other (See Comments)    REACTION: dyspnea    Social History   Socioeconomic History   Marital status: Married    Spouse name: Not on file   Number of children: Not on file   Years of education: Not on file   Highest education level: Not on file  Occupational History   Not on file  Tobacco Use   Smoking status: Never   Smokeless tobacco: Never  Substance and Sexual Activity   Alcohol use: No   Drug use: No   Sexual activity: Not on file  Other Topics Concern   Not on file  Social History Narrative   Pt lives in [place] alone.  Works at [employer] Strain: Not on [employer] Insecurity: Not on file  Transportation Needs: Not on file  Physical Activity: Not on file  Stress: Not on file  Social Connections: Not on file  Intimate Partner Violence: Not on file    Family History  Problem Relation Age of Onset   Coronary artery disease Father     ROS- All systems are reviewed and negative except as Ball the HPI above  Physical Exam: There were no vitals filed for this visit.   GEN- The patient is well appearing, alert and oriented x 3 today.   Head- normocephalic, atraumatic Eyes-  Sclera clear,  conjunctiva pink Ears- hearing intact Oropharynx- clear Neck- supple, no JVP Lymph- no cervical lymphadenopathy Lungs- Clear to ausculation bilaterally, normal work of breathing Heart- Regular rate and rhythm, no murmurs, rubs or gallops, PMI not laterally displaced GI- soft, NT, ND, + BS Extremities- no clubbing, cyanosis, or edema MS- no significant deformity or atrophy Skin- no rash or lesion Psych- euthymic mood, full affect Neuro- strength and sensation are intact  EKG-  Sinus rhythm at 67 bpm, pr int 67 ms, qrs int 88 ms, qtc 462 ms  Epic Ball reviewed  Assessment and Plan: 1. Persistent  AFib In SR today  Off Tikosyn for months due to CKD and qt issues  She was referred tro nephrology but no longer goes to them, reason unclear   Continue metoprolol tartrate 50 mg bid   2. H/o upper GI bleed Resolved  Avoid antiinflammatories Continue pradaxa 150 mg bid, on appropriate dose based on kidney function/ crcl cal at 36   3. HTN Elevated Will start amlodipine 5 mg , 1/2 tab qd Bp recheck in 2 weeks   4. Hyperlipidemia Continue Lipitor 10 mg daily  Am checking lipid panel as she has failed to get a Rose  I have encouraged  to establish with one for years   Liver/lipid in 2 weeks fasting   5. TIA/aneurysm Has failed to f/u with interventional radiologist I encouraged to do so but she does not seem motivated    F/u with afib clinic in 2 weeks    Rose Ball, [facility] [address] [place], [place] [address] [phone]

## 2021-05-04 ENCOUNTER — Other Ambulatory Visit: Payer: Self-pay

## 2021-05-04 ENCOUNTER — Ambulatory Visit (HOSPITAL_COMMUNITY)
Admission: RE | Admit: 2021-05-04 | Discharge: 2021-05-04 | Disposition: A | Discharge status: Home / Self Care | Payer: PPO | Source: Ambulatory Visit | Attending: Nurse Practitioner | Admitting: Nurse Practitioner

## 2021-05-04 DIAGNOSIS — Z79899 Other long term (current) drug therapy: Secondary | ICD-10-CM | POA: Insufficient documentation

## 2021-05-04 DIAGNOSIS — Z0130 Encounter for examination of blood pressure without abnormal findings: Secondary | ICD-10-CM | POA: Diagnosis present

## 2021-05-04 LAB — HEPATIC FUNCTION PANEL
ALT: 21 U/L (ref 0–44)
AST: 29 U/L (ref 15–41)
Albumin: 3.6 g/dL (ref 3.5–5.0)
Alkaline Phosphatase: 98 U/L (ref 38–126)
Bilirubin, Direct: <0.1 mg/dL (ref 0.0–0.2)
Total Bilirubin: 0.5 mg/dL (ref 0.3–1.2)
Total Protein: 6.7 g/dL (ref 6.5–8.1)

## 2021-05-04 LAB — LIPID PANEL
Cholesterol: 186 mg/dL (ref 0–200)
HDL: 48 mg/dL (ref >40)
LDL Cholesterol: 110 mg/dL — ABNORMAL HIGH (ref 0–99)
Total CHOL/HDL Ratio: 3.9 RATIO
Triglycerides: 139 mg/dL (ref <150)
VLDL: 28 mg/dL (ref 0–40)

## 2021-05-04 MED ORDER — AMLODIPINE BESYLATE 5 MG PO TABS: ORAL_TABLET | ORAL | Status: DC

## 2021-05-04 NOTE — Patient Instructions (Signed)
Increase Amlodipine-5mg  (Norvasc) - Take one tablet by mouth daily

## 2021-05-04 NOTE — Progress Notes (Signed)
Pt in for  BP check on 2.5 mg amlodipine. Improved at 144/72. I will increase to full tablet daily. Fasting liver/lipid drawn today. Follow BP's at home and I will see back in 6 months.

## 2021-05-05 ENCOUNTER — Encounter (HOSPITAL_COMMUNITY): Payer: Self-pay | Admitting: *Deleted

## 2021-05-07 ENCOUNTER — Other Ambulatory Visit (HOSPITAL_COMMUNITY): Payer: Self-pay | Admitting: Nurse Practitioner

## 2021-05-18 ENCOUNTER — Telehealth (HOSPITAL_COMMUNITY): Payer: Self-pay

## 2021-05-18 NOTE — Telephone Encounter (Signed)
Called to schedule consult, no answer, vm full. AW  

## 2021-05-24 ENCOUNTER — Ambulatory Visit (HOSPITAL_COMMUNITY): Admission: RE | Admit: 2021-05-24 | Payer: PPO | Source: Ambulatory Visit

## 2021-05-29 ENCOUNTER — Other Ambulatory Visit: Payer: Self-pay

## 2021-05-29 ENCOUNTER — Ambulatory Visit (HOSPITAL_COMMUNITY)
Admission: RE | Admit: 2021-05-29 | Discharge: 2021-05-29 | Disposition: A | Discharge status: Home / Self Care | Payer: PPO | Source: Ambulatory Visit | Attending: Neuroradiology | Admitting: Neuroradiology

## 2021-05-29 DIAGNOSIS — I671 Cerebral aneurysm, nonruptured: Secondary | ICD-10-CM

## 2021-05-29 NOTE — Consult Note (Signed)
Chief Complaint: Patient was seen in consultation today for right ICA aneurysm.  Referring Physician(s): Alric Ran, MD  Supervising Physician: Pedro Earls  Patient Status: [facility] - Out-pt  History of Present Illness: Rose Ball is a 76 year old female presenting to our service today for evaluation of incidental brain aneurysm.  Rose Ball head a transient episode of slurred speech in January 25, 2021.  She presented to the emergency for TIA workup.  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  Her past medical history significant for hypertension, atrial fibrillation, hyperlipidemia, depression, restless leg syndrome and ADD.  She denies known family history of brain aneurysm or subarachnoid hemorrhage.  She does not smoke and has never smoked.  She is currently on Pradaxa for her atrial fibrillation.   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    Past Medical History:  Diagnosis Date   Attention deficit    on Vyvanse   Chronic anticoagulation    Depression    Dyslipidemia    Hypertension    Hypokalemia    Morbid obesity ([place])    Palpitations    Persistent atrial fibrillation ([place]) 05/27/14 Chadsvasc score of 4   s/p cardioversion Sept 2012; failed    Past Surgical History:  Procedure Laterality Date   BIOPSY  01/02/2018   Procedure: BIOPSY;  Surgeon: Mauri Pole, MD;  Location: [facility] ENDOSCOPY;  Service: Endoscopy;;   BREAST BIOPSY     CARDIOVERSION  Sept 2012   failed   CARDIOVERSION N/A 09/08/2012   Procedure: CARDIOVERSION;  Surgeon: Fay Records, MD;  Location: [place];  Service: Cardiovascular;  Laterality: N/A;   CARDIOVERSION N/A 02/15/2013   Procedure: CARDIOVERSION-BEDSIDE;   Surgeon: Darlin Coco, MD;  Location: [place];  Service: Cardiovascular;  Laterality: N/A;   CARDIOVERSION N/A 02/05/2011   Procedure: CARDIOVERSION;  Surgeon: Evans Lance, MD;  Location: [facility] CATH LAB;  Service: Cardiovascular;  Laterality: N/A;   COLONOSCOPY N/A 01/03/2018   Procedure: COLONOSCOPY;  Surgeon: Mauri Pole, MD;  Location: [place] ENDOSCOPY;  Service: Endoscopy;  Laterality: N/A;   Convergent procedure  11/13/11   afib ablation at [place] N/A 02/09/2015   Procedure: Loop Recorder Removal;  Surgeon: [name], MD;  Location: [place] CV LAB;  Service: Cardiovascular;  Laterality: N/A;   ESOPHAGOGASTRODUODENOSCOPY (EGD) WITH PROPOFOL N/A 01/02/2018   Procedure: ESOPHAGOGASTRODUODENOSCOPY (EGD) WITH PROPOFOL;  Surgeon: Mauri Pole, MD;  Location: [place] ENDOSCOPY;  Service: Endoscopy;  Laterality: N/A;   loop recorder insertion Left 09/2011   Loop recorder removal  02/09/15   MDT Reveal XT loop recorder removed by Dr Rayann Heman   POLYPECTOMY  01/03/2018   Procedure: POLYPECTOMY;  Surgeon: Mauri Pole, MD;  Location: [place];  Service: Endoscopy;;   TEE WITHOUT CARDIOVERSION N/A 09/08/2012   Procedure: TRANSESOPHAGEAL ECHOCARDIOGRAM (TEE);  Surgeon: Fay Records, MD;  Location: [facility] ENDOSCOPY;  Service: Cardiovascular;  Laterality: N/A;   TONSILLECTOMY     TONSILLECTOMY     US ECHOCARDIOGRAPHY  Aug 2671   Normal systolic function with EF 55% and slight LAE at 4.4cm    Allergies: Amiodarone  Medications: Prior to Admission medications   Medication Sig Start Date End Date Taking? Authorizing  Provider  amLODipine (NORVASC) 5 MG tablet Take one tablet by mouth daily 05/04/21   Sherran Needs, NP  amphetamine-dextroamphetamine (ADDERALL) 15 MG tablet Take 15 mg by mouth daily as needed (for additional focus).  04/02/11   [provider]  atorvastatin (LIPITOR) 10 MG tablet Take 1 tablet (10 mg total) by mouth daily. 05/08/21   Sherran Needs, NP  dabigatran (PRADAXA) 150 MG CAPS capsule TAKE 1 CAPSULE BY MOUTH TWICE A DAY. KEEP UPCOMING APPOINTMENT. 05/08/21   Sherran Needs, NP  escitalopram (LEXAPRO) 20 MG tablet Take 20 mg by mouth Daily. Lexapro 07/07/10   [provider]  metoprolol tartrate (LOPRESSOR) 50 MG tablet TAKE 1 TABLET BY MOUTH TWICE A DAY 11/10/20   Sherran Needs, NP  rOPINIRole (REQUIP) 1 MG tablet Take 1 mg by mouth at bedtime.    [provider]  VYVANSE 70 MG capsule Take 70 mg by mouth Daily.  07/11/10   [provider]     Family History  Problem Relation Age of Onset   Coronary artery disease Father     Social History   Socioeconomic History   Marital status: Married    Spouse name: Not on file   Number of children: Not on file   Years of education: Not on file   Highest education level: Not on file  Occupational History   Not on file  Tobacco Use   Smoking status: Never   Smokeless tobacco: Never  Substance and Sexual Activity   Alcohol use: No   Drug use: No   Sexual activity: Not on file  Other Topics Concern   Not on file  Social History Narrative   Pt lives in [place] alone.  Works at [employer] Strain: Not on [employer] Insecurity: Not on file  Transportation Needs: Not on file  Physical Activity: Not on file  Stress: Not on file  Social Connections: Not on file     Review of Systems: A 12 point ROS discussed and pertinent positives are indicated in the HPI above.  All other systems are negative.  Review of Systems  Constitutional: Negative.   Respiratory: Negative.    Cardiovascular: Negative.   Gastrointestinal: Negative.   Musculoskeletal:  Positive for back pain and joint swelling.  Neurological: Negative.    Vital Signs: There were no vitals taken for this visit.  Physical Exam Constitutional:      Appearance: Normal appearance.  HENT:     Head:  Normocephalic and atraumatic.  Eyes:     Extraocular Movements: Extraocular movements intact.     Conjunctiva/sclera: Conjunctivae normal.     Pupils: Pupils are equal, round, and reactive to light.  Neurological:     General: No focal deficit present.     Mental Status: She is alert and oriented to person, place, and time.     Cranial Nerves: Cranial nerves 2-12 are intact.     Sensory: Sensation is intact.     Motor: Motor function is intact.     Coordination: Coordination is intact.     Gait: Gait is intact.         Imaging: No results found.  Labs:  CBC: Recent Labs    08/03/20 1619 01/25/21 1626 01/25/21 1640  WBC 7.0 6.8  --   HGB 13.9 13.6 14.3  HCT 44.0 42.6 42.0  PLT 277 298  --  COAGS: Recent Labs    01/25/21 1626  INR 1.9*  APTT 57*    BMP: Recent Labs    08/03/20 1619 01/25/21 1626 01/25/21 1640  NA 142 138 138  K 5.3* 4.8 4.7  CL 109 105 106  CO2 26 26  --   GLUCOSE 98 99 96  BUN 31* 35* 40*  CALCIUM 9.0 8.7*  --   CREATININE 2.05* 2.19* 2.10*  GFRNONAA 25* 23*  --     LIVER FUNCTION TESTS: Recent Labs    08/03/20 1619 01/25/21 1626 05/04/21 0924  BILITOT 0.7 0.6 0.5  AST 21 27 29   ALT 13 13 21   ALKPHOS 104 85 98  PROT 6.8 6.5 6.7  ALBUMIN 3.4* 3.2* 3.6    TUMOR MARKERS: No results for input(s): AFPTM, CEA, CA199, CHROMGRNA in the last 8760 hours.  Assessment and Plan:  Rose Ball is a pleasant 76 year old female presenting to discuss incidental brain aneurysm found on MRA performed as part of TIA workup.  I have reviewed the images with Rose Ball and explained the natural history of brain aneurysms.  Given aneurysm size, location and her chronic  use of anticoagulation, I recommended that she have this aneurysm treated endovascularly with the use of a flow diverter.  I informed her that this would require dual anti-platelet therapy in the perioperatively as well as use of aspirin in addition to the Pradaxa  after intervention for approximately 6 months.  She would like to have her aneurysm treated but asked that we call her in a week so that she can have time to organize herself and plan for the procedure.   Thank you for this interesting consult.  I greatly enjoyed meeting Rose Ball and look forward to participating in her care.  A copy of this report was sent to the requesting provider on this date.  Electronically Signed: Pedro Earls, MD 05/29/2021, 1:00 PM   I spent a total of  40 Minutes   in face to face in clinical consultation, greater than 50% of which was counseling/coordinating care for brain aneurysm.

## 2021-06-06 ENCOUNTER — Telehealth (HOSPITAL_COMMUNITY): Payer: Self-pay

## 2021-06-06 NOTE — Telephone Encounter (Signed)
Called pt to f/u, no answer, left vm. AW ?

## 2021-06-07 ENCOUNTER — Telehealth (HOSPITAL_COMMUNITY): Payer: Self-pay | Admitting: Radiology

## 2021-06-07 ENCOUNTER — Other Ambulatory Visit (HOSPITAL_COMMUNITY): Payer: Self-pay | Admitting: Neuroradiology

## 2021-06-07 DIAGNOSIS — I671 Cerebral aneurysm, nonruptured: Secondary | ICD-10-CM

## 2021-06-07 NOTE — Telephone Encounter (Signed)
Called pt, schedule appt info JM ?

## 2021-06-09 ENCOUNTER — Encounter (HOSPITAL_COMMUNITY): Payer: Self-pay

## 2021-06-09 ENCOUNTER — Other Ambulatory Visit (HOSPITAL_COMMUNITY): Payer: Self-pay | Admitting: Neuroradiology

## 2021-06-09 MED ORDER — TICAGRELOR 90 MG PO TABS: 90.0000 mg | ORAL_TABLET | Freq: Two times a day (BID) | ORAL | 0 refills | Status: DC

## 2021-06-09 MED ORDER — ASPIRIN EC 81 MG PO TBEC: 81.0000 mg | DELAYED_RELEASE_TABLET | Freq: Every day | ORAL | 11 refills | Status: DC

## 2021-06-20 ENCOUNTER — Encounter (HOSPITAL_COMMUNITY): Payer: Self-pay | Admitting: *Deleted

## 2021-06-20 ENCOUNTER — Other Ambulatory Visit: Payer: Self-pay

## 2021-06-20 ENCOUNTER — Other Ambulatory Visit: Payer: Self-pay | Admitting: Radiology

## 2021-06-20 NOTE — Anesthesia Preprocedure Evaluation (Addendum)
Anesthesia Evaluation  ?Patient identified by MRN, date of birth, ID band ?Patient awake ? ? ? ?Reviewed: ?Allergy & Precautions, NPO status , Patient's Chart, lab work & pertinent test results ? ?Airway ?Mallampati: II ? ?TM Distance: >3 FB ?Neck ROM: Full ? ? ? Dental ?no notable dental hx. ? ?  ?Pulmonary ?neg pulmonary ROS,  ?  ?Pulmonary exam normal ?breath sounds clear to auscultation ? ? ? ? ? ? Cardiovascular ?hypertension, Normal cardiovascular exam+ dysrhythmias Atrial Fibrillation  ?Rhythm:Regular Rate:Normal ? ? ?  ?Neuro/Psych ?TIACVA negative psych ROS  ? GI/Hepatic ?negative GI ROS, Neg liver ROS,   ?Endo/Other  ?Morbid obesity ? Renal/GU ?negative Renal ROS  ?negative genitourinary ?  ?Musculoskeletal ?negative musculoskeletal ROS ?(+)  ? Abdominal ?  ?Peds ?negative pediatric ROS ?(+)  Hematology ?negative hematology ROS ?(+)   ?Anesthesia Other Findings ? ? Reproductive/Obstetrics ?negative OB ROS ? ?  ? ? ? ? ? ? ? ? ? ? ? ? ? ?  ?  ? ? ? ? ? ? ? ?Anesthesia Physical ?Anesthesia Plan ? ?ASA: 3 ? ?Anesthesia Plan: General  ? ?Post-op Pain Management: Minimal or no pain anticipated  ? ?Induction: Intravenous ? ?PONV Risk Score and Plan: 3 and Ondansetron, Dexamethasone and Treatment may vary due to age or medical condition ? ?Airway Management Planned: Oral ETT ? ?Additional Equipment: Arterial line ? ?Intra-op Plan:  ? ?Post-operative Plan: Extubation in OR ? ?Informed Consent: I have reviewed the patients History and Physical, chart, labs and discussed the procedure including the risks, benefits and alternatives for the proposed anesthesia with the patient or authorized representative who has indicated his/her understanding and acceptance.  ? ? ? ?Dental advisory given ? ?Plan Discussed with: CRNA and Surgeon ? ?Anesthesia Plan Comments: (PAT note by Karoline Caldwell, PA-C: ? ?Recent history of TIA 01/25/2021 with incidental discovery of 10 mm supraclinoid right ICA  aneurysm.  She was subsequently seen by interventional radiology and recommended to undergo embolization of the aneurysm. ? ?Follows with cardiology for history of atrial fibrillation s/p convergent procedure 2013, maintained on Pradaxa.  She was last seen in A-fib clinic on 04/25/2021.  At that time she was encouraged to follow-up with interventional radiology due to recent discovery of cerebral aneurysm.  Her blood pressure was also noted to be elevated and she was started on amlodipine.  She was advised to follow-up 1 week for recheck of blood pressure and labs.  She was seen on 05/04/2021 blood pressure noted to be improved at 144/72.  Hepatic function panel was WNL.  She was advised to follow-up in 6 months. ? ?Patient will need day of surgery labs and evaluation. ? ?EKG 04/25/2021: Sinus rhythm with first-degree AV block.  Rate 67. ? ?TTE 01/25/2021: ??1. Left ventricular ejection fraction, by estimation, is 55 to 60%. The  ?left ventricle has normal function. The left ventricle has no regional  ?wall motion abnormalities. There is mild left ventricular hypertrophy.  ?Left ventricular diastolic parameters  ?are indeterminate.  ??2. Right ventricular systolic function is normal. The right ventricular  ?size is normal. Tricuspid regurgitation signal is inadequate for assessing  ?PA pressure.  ??3. Left atrial size was mildly dilated.  ??4. The mitral valve is normal in structure. No evidence of mitral valve  ?regurgitation. No evidence of mitral stenosis.  ??5. The aortic valve is tricuspid. Aortic valve regurgitation is trivial.  ?No aortic stenosis is present.  ??6. The inferior vena cava is normal in size with greater than  50%  ?respiratory variability, suggesting right atrial pressure of 3 mmHg.  ??7. The patient was in atrial fibrillation. ? ?)  ? ? ? ? ? ?Anesthesia Quick Evaluation ? ?

## 2021-06-20 NOTE — Progress Notes (Signed)
Rose Ball denies chest pain or shortness of breath. Patient denies having any s/s of Covid in her household.  Patient denies any known exposure to Covid.  ? ?Rose Ball has a history of Afib and shortness of breath. Patient reports that she feels like she is in Afib "all the time". I asked patient if she haas called the Afib clinic, she said no, they know about it, I informed patient that the notes from the January Clinic visit states that she is in Sinus Rhythm. Rose Ball states,"well I'm not in it all the time, but when I climb the stairs or I'm doing much of anything, it goes into Afib and my heart rate goes up."  Rose Ball reports that heart rate is around 121-130. I encouraged patient to call the Afib clinic, since she is fells like she is in afib more often than when she last was seen. Rose Ball also complains of shortness of breath when she heart rate increases ? ?Rose Ball does not have a PCP. ? ?Rose Ball had a TIA 01/25/21, she has no residual.  Patient sees Dr. Alric Ran. ? ?I asked anesthesia PA-C to review chart. ? ?I instructed Rose Ball to shower with antibacteria soap.  DO not shave. No nail polish, artificial or acrylic nails. Wear clean clothes, brush your teeth. ?Glasses, contact lens,dentures or partials may not be worn in the OR. If you need to wear them, please bring a case for glasses, do not wear contacts or bring a case, the hospital does not have contact cases, dentures or partials will have to be removed , make sure they are clean, we will provide a denture cup to put them in. You will need some one to drive you home and a responsible person over the age of 36 to stay with you for the first 24 hours after surgery.  ? ?Rose Ball reports that she missed her Covid test yesterday, she said she is going today.  ?I instructed patient to call the Dr's office to see if she still needs to have one since the hospital has changed requirements. ? ?

## 2021-06-20 NOTE — Progress Notes (Signed)
Anesthesia Chart Review: ?Same day workup ? 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  She was subsequently seen by interventional radiology and recommended to undergo embolization of the aneurysm. ? ?Follows with cardiology for history of atrial fibrillation s/p convergent procedure 2013, maintained on Pradaxa.  She was last seen in A-fib clinic on 04/25/2021.  At that time she was encouraged to follow-up with interventional radiology due to recent discovery of cerebral aneurysm.  Her blood pressure was also noted to be elevated and she was started on amlodipine.  She was advised to follow-up 1 week for recheck of blood pressure and labs.  She was seen on 05/04/2021 blood pressure noted to be improved at 144/72.  Hepatic function panel was WNL.  She was advised to follow-up in 6 months. ? ?Patient will need day of surgery labs and evaluation. ? ?EKG 04/25/2021: Sinus rhythm with first-degree AV block.  Rate 67. ? ?TTE 01/25/2021: ? 1. Left ventricular ejection fraction, by estimation, is 55 to 60%. The  ?left ventricle has normal function. The left ventricle has no regional  ?wall motion abnormalities. There is mild left ventricular hypertrophy.  ?Left ventricular diastolic parameters  ?are indeterminate.  ? 2. Right ventricular systolic function is normal. The right ventricular  ?size is normal. Tricuspid regurgitation signal is inadequate for assessing  ?PA pressure.  ? 3. Left atrial size was mildly dilated.  ? 4. The mitral valve is normal in structure. No evidence of mitral valve  ?regurgitation. No evidence of mitral stenosis.  ? 5. The aortic valve is tricuspid. Aortic valve regurgitation is trivial.  ?No aortic stenosis is present.  ? 6. The inferior vena cava is normal in size with greater than 50%  ?respiratory variability, suggesting right atrial pressure of 3 mmHg.  ? 7. The patient was in atrial fibrillation. ? ? ? ?Karoline Caldwell, PA-C ?[facility]/Anesthesiology ?Phone [phone] ?06/20/2021 4:59 PM ? ?

## 2021-06-21 ENCOUNTER — Inpatient Hospital Stay (HOSPITAL_COMMUNITY)
Admission: AD | Admit: 2021-06-21 | Discharge: 2021-06-22 | DRG: 026 | Disposition: A | Discharge status: Home / Self Care | Payer: PPO | Attending: Neuroradiology | Admitting: Neuroradiology

## 2021-06-21 ENCOUNTER — Encounter (HOSPITAL_COMMUNITY): Payer: Self-pay

## 2021-06-21 ENCOUNTER — Observation Stay (HOSPITAL_COMMUNITY)
Admission: RE | Admit: 2021-06-21 | Discharge: 2021-06-21 | Disposition: A | Discharge status: Home / Self Care | Payer: PPO | Source: Ambulatory Visit | Attending: Neuroradiology | Admitting: Neuroradiology

## 2021-06-21 ENCOUNTER — Encounter (HOSPITAL_COMMUNITY)
Admission: AD | Disposition: A | Discharge status: Home / Self Care | Payer: Self-pay | Source: Home / Self Care | Attending: Neuroradiology

## 2021-06-21 ENCOUNTER — Ambulatory Visit (HOSPITAL_COMMUNITY): Payer: PPO | Admitting: Physician Assistant

## 2021-06-21 ENCOUNTER — Other Ambulatory Visit: Payer: Self-pay

## 2021-06-21 DIAGNOSIS — F419 Anxiety disorder, unspecified: Secondary | ICD-10-CM | POA: Diagnosis present

## 2021-06-21 DIAGNOSIS — G2581 Restless legs syndrome: Secondary | ICD-10-CM | POA: Diagnosis present

## 2021-06-21 DIAGNOSIS — F909 Attention-deficit hyperactivity disorder, unspecified type: Secondary | ICD-10-CM | POA: Diagnosis present

## 2021-06-21 DIAGNOSIS — I10 Essential (primary) hypertension: Secondary | ICD-10-CM | POA: Diagnosis not present

## 2021-06-21 DIAGNOSIS — I4819 Other persistent atrial fibrillation: Secondary | ICD-10-CM | POA: Diagnosis present

## 2021-06-21 DIAGNOSIS — Z8673 Personal history of transient ischemic attack (TIA), and cerebral infarction without residual deficits: Secondary | ICD-10-CM

## 2021-06-21 DIAGNOSIS — I671 Cerebral aneurysm, nonruptured: Secondary | ICD-10-CM | POA: Diagnosis not present

## 2021-06-21 DIAGNOSIS — Z79899 Other long term (current) drug therapy: Secondary | ICD-10-CM | POA: Insufficient documentation

## 2021-06-21 DIAGNOSIS — Z7901 Long term (current) use of anticoagulants: Secondary | ICD-10-CM | POA: Diagnosis not present

## 2021-06-21 DIAGNOSIS — Z6831 Body mass index (BMI) 31.0-31.9, adult: Secondary | ICD-10-CM

## 2021-06-21 DIAGNOSIS — Z8249 Family history of ischemic heart disease and other diseases of the circulatory system: Secondary | ICD-10-CM

## 2021-06-21 DIAGNOSIS — Z7902 Long term (current) use of antithrombotics/antiplatelets: Secondary | ICD-10-CM

## 2021-06-21 DIAGNOSIS — I4891 Unspecified atrial fibrillation: Secondary | ICD-10-CM

## 2021-06-21 DIAGNOSIS — Z7982 Long term (current) use of aspirin: Secondary | ICD-10-CM | POA: Diagnosis not present

## 2021-06-21 DIAGNOSIS — E785 Hyperlipidemia, unspecified: Secondary | ICD-10-CM | POA: Diagnosis present

## 2021-06-21 DIAGNOSIS — F32A Depression, unspecified: Secondary | ICD-10-CM | POA: Diagnosis present

## 2021-06-21 HISTORY — PX: IR TRANSCATH/EMBOLIZ: IMG695

## 2021-06-21 HISTORY — DX: Pneumonia, unspecified organism: J18.9

## 2021-06-21 HISTORY — PX: IR ANGIOGRAM FOLLOW UP STUDY: IMG697

## 2021-06-21 HISTORY — DX: Cardiac arrhythmia, unspecified: I49.9

## 2021-06-21 HISTORY — DX: Unspecified osteoarthritis, unspecified site: M19.90

## 2021-06-21 HISTORY — PX: IR US GUIDE VASC ACCESS RIGHT: IMG2390

## 2021-06-21 HISTORY — PX: IR ANGIO INTRA EXTRACRAN SEL INTERNAL CAROTID UNI R MOD SED: IMG5362

## 2021-06-21 HISTORY — PX: IR 3D INDEPENDENT WKST: IMG2385

## 2021-06-21 HISTORY — PX: IR ANGIO VERTEBRAL SEL SUBCLAVIAN INNOMINATE BILAT MOD SED: IMG5366

## 2021-06-21 HISTORY — DX: Anxiety disorder, unspecified: F41.9

## 2021-06-21 HISTORY — PX: RADIOLOGY WITH ANESTHESIA: SHX6223

## 2021-06-21 HISTORY — DX: Attention-deficit hyperactivity disorder, unspecified type: F90.9

## 2021-06-21 HISTORY — PX: IR ANGIO INTRA EXTRACRAN SEL COM CAROTID INNOMINATE UNI L MOD SED: IMG5358

## 2021-06-21 HISTORY — PX: IR CT HEAD LTD: IMG2386

## 2021-06-21 HISTORY — DX: Transient cerebral ischemic attack, unspecified: G45.9

## 2021-06-21 HISTORY — PX: IR NEURO EACH ADD'L AFTER BASIC UNI RIGHT (MS): IMG5374

## 2021-06-21 HISTORY — DX: Cerebral infarction, unspecified: I63.9

## 2021-06-21 LAB — CBC WITH DIFFERENTIAL/PLATELET
Abs Immature Granulocytes: 0.05 10*3/uL (ref 0.00–0.07)
Basophils Absolute: 0.1 10*3/uL (ref 0.0–0.1)
Basophils Relative: 1 %
Eosinophils Absolute: 0.2 10*3/uL (ref 0.0–0.5)
Eosinophils Relative: 2 %
HCT: 45 % (ref 36.0–46.0)
Hemoglobin: 14.2 g/dL (ref 12.0–15.0)
Immature Granulocytes: 1 %
Lymphocytes Relative: 16 %
Lymphs Abs: 1.4 10*3/uL (ref 0.7–4.0)
MCH: 27.8 pg (ref 26.0–34.0)
MCHC: 31.6 g/dL (ref 30.0–36.0)
MCV: 88.1 fL (ref 80.0–100.0)
Monocytes Absolute: 0.8 10*3/uL (ref 0.1–1.0)
Monocytes Relative: 9 %
Neutro Abs: 6.2 10*3/uL (ref 1.7–7.7)
Neutrophils Relative %: 71 %
Platelets: 311 10*3/uL (ref 150–400)
RBC: 5.11 MIL/uL (ref 3.87–5.11)
RDW: 13.9 % (ref 11.5–15.5)
WBC: 8.7 10*3/uL (ref 4.0–10.5)
nRBC: 0 % (ref 0.0–0.2)

## 2021-06-21 LAB — BASIC METABOLIC PANEL
Anion gap: 11 (ref 5–15)
BUN: 34 mg/dL — ABNORMAL HIGH (ref 8–23)
CO2: 23 mmol/L (ref 22–32)
Calcium: 9.1 mg/dL (ref 8.9–10.3)
Chloride: 102 mmol/L (ref 98–111)
Creatinine, Ser: 2.14 mg/dL — ABNORMAL HIGH (ref 0.44–1.00)
GFR, Estimated: 24 mL/min — ABNORMAL LOW (ref >60)
Glucose, Bld: 90 mg/dL (ref 70–99)
Potassium: 4.7 mmol/L (ref 3.5–5.1)
Sodium: 136 mmol/L (ref 135–145)

## 2021-06-21 LAB — POCT ACTIVATED CLOTTING TIME
Activated Clotting Time: 155 seconds
Activated Clotting Time: 227 seconds
Activated Clotting Time: 209 seconds

## 2021-06-21 LAB — MRSA NEXT GEN BY PCR, NASAL: MRSA by PCR Next Gen: NOT DETECTED

## 2021-06-21 LAB — PROTIME-INR
INR: 1 (ref 0.8–1.2)
Prothrombin Time: 13.1 seconds (ref 11.4–15.2)

## 2021-06-21 SURGERY — IR WITH ANESTHESIA
Anesthesia: General

## 2021-06-21 MED ORDER — ACETAMINOPHEN 10 MG/ML IV SOLN: 1000.0000 mg | Freq: Once | INTRAVENOUS | Status: DC | PRN

## 2021-06-21 MED ORDER — IOHEXOL 300 MG/ML  SOLN
100.0000 mL | Freq: Once | INTRAMUSCULAR | Status: AC | PRN
  Administered 2021-06-21: 75 mL via INTRA_ARTERIAL

## 2021-06-21 MED ORDER — CLEVIDIPINE BUTYRATE 0.5 MG/ML IV EMUL
INTRAVENOUS | Status: DC | PRN
  Administered 2021-06-21: 3 mg/h via INTRAVENOUS

## 2021-06-21 MED ORDER — PHENYLEPHRINE 40 MCG/ML (10ML) SYRINGE FOR IV PUSH (FOR BLOOD PRESSURE SUPPORT)
PREFILLED_SYRINGE | INTRAVENOUS | Status: DC | PRN
Start: 2021-06-21 — End: 2021-06-21
  Administered 2021-06-21 (×2): 120 ug via INTRAVENOUS

## 2021-06-21 MED ORDER — ACETAMINOPHEN 160 MG/5ML PO SOLN
650.0000 mg | ORAL | Status: DC | PRN
Start: 2021-06-21 — End: 2021-06-22

## 2021-06-21 MED ORDER — CEFAZOLIN SODIUM-DEXTROSE 2-4 GM/100ML-% IV SOLN
2.0000 g | INTRAVENOUS | Status: AC
  Administered 2021-06-21: 2 g via INTRAVENOUS
  Filled 2021-06-21 (×2): qty 100

## 2021-06-21 MED ORDER — HEPARIN SODIUM (PORCINE) 1000 UNIT/ML IJ SOLN
INTRAMUSCULAR | Status: AC
  Filled 2021-06-21: qty 10

## 2021-06-21 MED ORDER — CHLORHEXIDINE GLUCONATE 0.12 % MT SOLN: 15.0000 mL | Freq: Once | OROMUCOSAL | Status: DC

## 2021-06-21 MED ORDER — SODIUM CHLORIDE 0.9 % IV SOLN: INTRAVENOUS | Status: DC

## 2021-06-21 MED ORDER — MELATONIN 5 MG PO TABS
20.0000 mg | ORAL_TABLET | Freq: Every day | ORAL | Status: DC
  Administered 2021-06-21: 20 mg via ORAL
  Filled 2021-06-21: qty 4

## 2021-06-21 MED ORDER — FENTANYL CITRATE (PF) 250 MCG/5ML IJ SOLN
INTRAMUSCULAR | Status: DC | PRN
  Administered 2021-06-21 (×2): 50 ug via INTRAVENOUS

## 2021-06-21 MED ORDER — ASPIRIN 81 MG PO CHEW
81.0000 mg | CHEWABLE_TABLET | Freq: Once | ORAL | Status: AC
Start: 2021-06-21 — End: 2021-06-21
  Administered 2021-06-21: 81 mg via ORAL

## 2021-06-21 MED ORDER — ORAL CARE MOUTH RINSE: 15.0000 mL | Freq: Once | OROMUCOSAL | Status: DC

## 2021-06-21 MED ORDER — ASPIRIN 81 MG PO CHEW
CHEWABLE_TABLET | ORAL | Status: AC
  Filled 2021-06-21: qty 1

## 2021-06-21 MED ORDER — LIDOCAINE HCL 1 % IJ SOLN
INTRAMUSCULAR | Status: AC
  Filled 2021-06-21: qty 20

## 2021-06-21 MED ORDER — ROCURONIUM BROMIDE 10 MG/ML (PF) SYRINGE
PREFILLED_SYRINGE | INTRAVENOUS | Status: DC | PRN
  Administered 2021-06-21: 30 mg via INTRAVENOUS
  Administered 2021-06-21: 70 mg via INTRAVENOUS

## 2021-06-21 MED ORDER — ORAL CARE MOUTH RINSE
15.0000 mL | Freq: Two times a day (BID) | OROMUCOSAL | Status: DC
  Administered 2021-06-21 (×2): 15 mL via OROMUCOSAL

## 2021-06-21 MED ORDER — ONDANSETRON HCL 4 MG/2ML IJ SOLN
INTRAMUSCULAR | Status: DC | PRN
  Administered 2021-06-21: 4 mg via INTRAVENOUS

## 2021-06-21 MED ORDER — ROPINIROLE HCL 1 MG PO TABS
1.0000 mg | ORAL_TABLET | Freq: Every day | ORAL | Status: DC
  Administered 2021-06-21: 1 mg via ORAL
  Filled 2021-06-21 (×2): qty 1

## 2021-06-21 MED ORDER — LACTATED RINGERS IV SOLN: INTRAVENOUS | Status: DC

## 2021-06-21 MED ORDER — CHLORHEXIDINE GLUCONATE 0.12 % MT SOLN
OROMUCOSAL | Status: AC
Start: 2021-06-21 — End: 2021-06-21
  Filled 2021-06-21: qty 15

## 2021-06-21 MED ORDER — PROPOFOL 10 MG/ML IV BOLUS
INTRAVENOUS | Status: DC | PRN
  Administered 2021-06-21: 150 mg via INTRAVENOUS

## 2021-06-21 MED ORDER — ASPIRIN 81 MG PO CHEW: 81.0000 mg | CHEWABLE_TABLET | Freq: Every day | ORAL | Status: DC

## 2021-06-21 MED ORDER — CLEVIDIPINE BUTYRATE 0.5 MG/ML IV EMUL
INTRAVENOUS | Status: AC
  Filled 2021-06-21: qty 50

## 2021-06-21 MED ORDER — ONDANSETRON HCL 4 MG/2ML IJ SOLN: 4.0000 mg | Freq: Once | INTRAMUSCULAR | Status: DC | PRN

## 2021-06-21 MED ORDER — LIDOCAINE 2% (20 MG/ML) 5 ML SYRINGE
INTRAMUSCULAR | Status: DC | PRN
  Administered 2021-06-21: 100 mg via INTRAVENOUS

## 2021-06-21 MED ORDER — ACETAMINOPHEN 325 MG PO TABS: 650.0000 mg | ORAL_TABLET | ORAL | Status: DC | PRN

## 2021-06-21 MED ORDER — CLEVIDIPINE BUTYRATE 0.5 MG/ML IV EMUL
0.0000 mg/h | INTRAVENOUS | Status: DC
  Administered 2021-06-21: 8 mg/h via INTRAVENOUS
  Administered 2021-06-21: 5 mg/h via INTRAVENOUS
  Administered 2021-06-21: 4 mg/h via INTRAVENOUS
  Administered 2021-06-21: 8 mg/h via INTRAVENOUS
  Administered 2021-06-22 (×3): 10 mg/h via INTRAVENOUS
  Administered 2021-06-22: 16 mg/h via INTRAVENOUS
  Filled 2021-06-21 (×7): qty 50

## 2021-06-21 MED ORDER — ONDANSETRON HCL 4 MG/2ML IJ SOLN: 4.0000 mg | Freq: Four times a day (QID) | INTRAMUSCULAR | Status: DC | PRN

## 2021-06-21 MED ORDER — TICAGRELOR 90 MG PO TABS
90.0000 mg | ORAL_TABLET | Freq: Two times a day (BID) | ORAL | Status: DC
  Administered 2021-06-21 – 2021-06-22 (×2): 90 mg via ORAL
  Filled 2021-06-21 (×2): qty 1

## 2021-06-21 MED ORDER — SODIUM CHLORIDE 0.9 % IV SOLN: INTRAVENOUS | Status: DC | PRN

## 2021-06-21 MED ORDER — TICAGRELOR 90 MG PO TABS: 90.0000 mg | ORAL_TABLET | Freq: Two times a day (BID) | ORAL | Status: DC

## 2021-06-21 MED ORDER — ACETAMINOPHEN 650 MG RE SUPP: 650.0000 mg | RECTAL | Status: DC | PRN

## 2021-06-21 MED ORDER — DEXAMETHASONE SODIUM PHOSPHATE 10 MG/ML IJ SOLN
INTRAMUSCULAR | Status: DC | PRN
  Administered 2021-06-21: 10 mg via INTRAVENOUS

## 2021-06-21 MED ORDER — IOHEXOL 300 MG/ML  SOLN
100.0000 mL | Freq: Once | INTRAMUSCULAR | Status: AC | PRN
  Administered 2021-06-21: 50 mL via INTRA_ARTERIAL

## 2021-06-21 MED ORDER — ACETAMINOPHEN 10 MG/ML IV SOLN
INTRAVENOUS | Status: AC
  Administered 2021-06-21: 1000 mg via INTRAVENOUS
  Filled 2021-06-21: qty 100

## 2021-06-21 MED ORDER — CHLORHEXIDINE GLUCONATE CLOTH 2 % EX PADS: 6.0000 | MEDICATED_PAD | Freq: Every day | CUTANEOUS | Status: DC

## 2021-06-21 MED ORDER — PHENYLEPHRINE HCL-NACL 20-0.9 MG/250ML-% IV SOLN
INTRAVENOUS | Status: DC | PRN
  Administered 2021-06-21: 40 ug/min via INTRAVENOUS

## 2021-06-21 MED ORDER — VERAPAMIL HCL 2.5 MG/ML IV SOLN
INTRAVENOUS | Status: AC
Start: 2021-06-21 — End: 2021-06-21
  Filled 2021-06-21: qty 2

## 2021-06-21 MED ORDER — FENTANYL CITRATE (PF) 100 MCG/2ML IJ SOLN: 25.0000 ug | INTRAMUSCULAR | Status: DC | PRN

## 2021-06-21 MED ORDER — OXYCODONE HCL 5 MG/5ML PO SOLN: 5.0000 mg | Freq: Once | ORAL | Status: DC | PRN

## 2021-06-21 MED ORDER — ASPIRIN 81 MG PO CHEW
81.0000 mg | CHEWABLE_TABLET | Freq: Every day | ORAL | Status: DC
  Administered 2021-06-22: 81 mg via ORAL
  Filled 2021-06-21: qty 1

## 2021-06-21 MED ORDER — NITROGLYCERIN 1 MG/10 ML FOR IR/CATH LAB
INTRA_ARTERIAL | Status: AC
  Filled 2021-06-21: qty 10

## 2021-06-21 MED ORDER — NIMODIPINE 30 MG PO CAPS
0.0000 mg | ORAL_CAPSULE | ORAL | Status: AC
  Administered 2021-06-21: 60 mg via ORAL
  Filled 2021-06-21: qty 2

## 2021-06-21 MED ORDER — VERAPAMIL HCL 2.5 MG/ML IV SOLN
INTRA_ARTERIAL | Status: AC | PRN
  Administered 2021-06-21: 11 mL via INTRA_ARTERIAL

## 2021-06-21 MED ORDER — HEPARIN SODIUM (PORCINE) 1000 UNIT/ML IJ SOLN
INTRAMUSCULAR | Status: DC | PRN
  Administered 2021-06-21: 1000 [IU] via INTRAVENOUS

## 2021-06-21 MED ORDER — SUGAMMADEX SODIUM 200 MG/2ML IV SOLN
INTRAVENOUS | Status: DC | PRN
  Administered 2021-06-21: 200 mg via INTRAVENOUS

## 2021-06-21 MED ORDER — TICAGRELOR 90 MG PO TABS
90.0000 mg | ORAL_TABLET | ORAL | Status: AC
  Administered 2021-06-21: 90 mg via ORAL
  Filled 2021-06-21: qty 1

## 2021-06-21 MED ORDER — OXYCODONE HCL 5 MG PO TABS: 5.0000 mg | ORAL_TABLET | Freq: Once | ORAL | Status: DC | PRN

## 2021-06-21 NOTE — Progress Notes (Signed)
? ? ?Referring Physician(s): ?de Macedo Rodrigues,Katyucia ? ?Supervising Physician: Pedro Earls ? ?Patient Status:  [facility] - In-pt ? ?Chief Complaint: ? ?Endovascular embolization performed with placement of a 5 x 21/14 FRED flow diverter for right ICA ophthalmic segment aneurysm. ? ?Subjective: ? ?Pt sitting up in bed with meal tray. She denies pain, dizziness, headache or vision changes. Pt states she is hungry. She has no other c/o. ? ?Allergies: ?Amiodarone ? ?Medications: ?Prior to Admission medications   ?Medication Sig Start Date End Date Taking? Authorizing Provider  ?amLODipine (NORVASC) 5 MG tablet Take one tablet by mouth daily 05/04/21   Sherran Needs, NP  ?amphetamine-dextroamphetamine (ADDERALL) 15 MG tablet Take 15 mg by mouth daily as needed (for additional focus).  04/02/11   [provider]  ?aspirin EC 81 MG tablet Take 1 tablet (81 mg total) by mouth daily. Swallow whole. 06/14/21   de Rosario Jacks, MD  ?atorvastatin (LIPITOR) 10 MG tablet Take 1 tablet (10 mg total) by mouth daily. 05/08/21   Sherran Needs, NP  ?dabigatran (PRADAXA) 150 MG CAPS capsule TAKE 1 CAPSULE BY MOUTH TWICE A DAY. KEEP UPCOMING APPOINTMENT. 05/08/21   Sherran Needs, NP  ?escitalopram (LEXAPRO) 20 MG tablet Take 20 mg by mouth Daily. Lexapro 07/07/10   [provider]  ?Melatonin 10 MG TABS Take 10 mg by mouth at bedtime.    [provider]  ?metoprolol tartrate (LOPRESSOR) 50 MG tablet TAKE 1 TABLET BY MOUTH TWICE A DAY 11/10/20   Sherran Needs, NP  ?Multiple Vitamins-Minerals (MULTIVITAMIN WITH MINERALS) tablet Take 1 tablet by mouth daily.    [provider]  ?rOPINIRole (REQUIP) 1 MG tablet Take 1 mg by mouth at bedtime.    [provider]  ?ticagrelor (BRILINTA) 90 MG TABS tablet Take 1 tablet (90 mg total) by mouth 2 (two) times daily. 06/14/21 07/14/21  de Rosario Jacks, MD  ?VYVANSE 70 MG capsule Take 70 mg by mouth Daily.   07/11/10   [provider]  ? ? ? ?Vital Signs: ?There were no vitals taken for this visit. ? ?Physical Exam ?Vitals reviewed.  ?Constitutional:   ?   Appearance: Normal appearance. She is not ill-appearing.  ?HENT:  ?   Head: Normocephalic and atraumatic.  ?Eyes:  ?   Extraocular Movements: Extraocular movements intact.  ?   Pupils: Pupils are equal, round, and reactive to light.  ?Cardiovascular:  ?   Rate and Rhythm: Normal rate. Rhythm irregular.  ?Pulmonary:  ?   Effort: Pulmonary effort is normal. No respiratory distress.  ?Skin: ?   Findings: Bruising present.  ?   Comments: R distal radial artery puncture site has pressure band with all air removed. There is no active bleeding and no appreciable swelling.  ? ?Pt has small area of bruising to dorsum of hand. ?Small bruise to R shoulder.   ?Neurological:  ?   Mental Status: She is alert and oriented to person, place, and time.  ?Psychiatric:     ?   Mood and Affect: Mood normal.     ?   Behavior: Behavior normal.     ?   Thought Content: Thought content normal.     ?   Judgment: Judgment normal.  ? ? ?Imaging: ?No results found. ? ?Labs: ? ?CBC: ?Recent Labs  ?  08/03/20 ?1619 01/25/21 ?1626 01/25/21 ?1640 06/21/21 ?0733  ?WBC 7.0 6.8  --  8.7  ?HGB 13.9 13.6 14.3 14.2  ?  HCT 44.0 42.6 42.0 45.0  ?PLT 277 298  --  311  ? ? ?COAGS: ?Recent Labs  ?  01/25/21 ?1626 06/21/21 ?0733  ?INR 1.9* 1.0  ?APTT 57*  --   ? ? ?BMP: ?Recent Labs  ?  08/03/20 ?1619 01/25/21 ?1626 01/25/21 ?1640 06/21/21 ?0733  ?NA 142 138 138 136  ?K 5.3* 4.8 4.7 4.7  ?CL 109 105 106 102  ?CO2 26 26  --  23  ?GLUCOSE 98 99 96 90  ?BUN 31* 35* 40* 34*  ?CALCIUM 9.0 8.7*  --  9.1  ?CREATININE 2.05* 2.19* 2.10* 2.14*  ?GFRNONAA 25* 23*  --  24*  ? ? ?LIVER FUNCTION TESTS: ?Recent Labs  ?  08/03/20 ?1619 01/25/21 ?1626 05/04/21 ?0924  ?BILITOT 0.7 0.6 0.5  ?AST '21 27 29  '$ ?ALT '13 13 21  '$ ?ALKPHOS 104 85 98  ?PROT 6.8 6.5 6.7  ?ALBUMIN 3.4* 3.2* 3.6  ? ? ?Assessment and Plan: ? ?Pt sitting up  in bed with meal tray. She denies pain, dizziness, headache or vision changes. Pt states she is hungry. She has no other c/o.  ?R distal radial artery puncture site has pressure band with all air removed. There is no active bleeding and no appreciable swelling.  ? ? Check P2Y12 result 06/22/21 for possible medication adjustment.  ? Plan to d/c 09/03/52 pending no complications per Dr. Katherina Right    ? Debbrah Alar.  ? Contact IR with questions/concerns ? ?Electronically Signed: ?Tyson Alias, NP ?06/21/2021, 3:12 PM ? ? ?I spent a total of 15 Minutes at the the patient's bedside AND on the patient's hospital floor or unit, greater than 50% of which was counseling/coordinating care for endovascular embolization performed with placement of a 5 x 21/14 FRED flow diverter for right ICA ophthalmic segment aneurysm. ? ? ? ?  ?

## 2021-06-21 NOTE — H&P (Signed)
? ?Chief Complaint: ?Patient was seen in consultation today for right ICA aneurysm ? ?Referring Physician(s): ?[place] ? ?Supervising Physician: Pedro Earls ? ?Patient Status: [facility] - Out-pt ? ?History of Present Illness: ?Rose Ball is a 76 y.o. female with a medical history significant for ADHD, anxiety/depression, GI bleed, restless leg syndrome, HTN, morbid obesity, persistent atrial fibrillation (failed cardioversion September 2012, takes pradaxa), and stroke. She presented to the ED 01/25/21 for a transient episode of slurred speech and she underwent TIA work up. 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 ? ?MR Angio Head/Neck without contrast 01/25/21 ?IMPRESSION: ?1. Negative MRA of the head and neck for large vessel occlusion. No ?hemodynamically significant or correctable stenosis. ?2. 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 This would likely ?be amenable to endovascular therapy. ?3. Fetal type origin of the PCAs with overall diminutive ?vertebrobasilar system, a normal anatomic variant. ? ?She was referred to Neuro Interventional Radiology and she met with Dr. Karenann Cai 05/29/21 to discuss possible management options. Given the aneurysm size, location and her chronic use of anticoagulation the recommendation was made for endovascular treatment. The patient was in agreement with this plan and she was started on brilinta and aspirin daily (in addition to pradaxa). The procedure was scheduled with general anesthesia and planned overnight observation in the Neuro ICU.  ? ?Past Medical History:  ?Diagnosis Date  ? ADHD (attention deficit hyperactivity disorder)   ? Anxiety   ? Arthritis   ? Attention deficit   ? on Vyvanse  ? Chronic anticoagulation   ? Depression   ? Dyslipidemia   ? Dysrhythmia   ? GI bleed 2022  ? Hypertension   ?  Hypokalemia   ? Morbid obesity ([place])   ? Palpitations   ? Persistent atrial fibrillation ([place]) 05/27/14 Chadsvasc score of 4  ? s/p cardioversion Sept 2012; failed  ? Pneumonia   ? Stroke [facility])   ? TIA (transient ischemic attack)   ? 12/2020  ? ? ?Past Surgical History:  ?Procedure Laterality Date  ? BIOPSY  01/02/2018  ? Procedure: BIOPSY;  Surgeon: Mauri Pole, MD;  Location: [place];  Service: Endoscopy;;  ? BREAST BIOPSY    ? CARDIOVERSION  Sept 2012  ? failed  ? CARDIOVERSION N/A 09/08/2012  ? Procedure: CARDIOVERSION;  Surgeon: Fay Records, MD;  Location: [place];  Service: Cardiovascular;  Laterality: N/A;  ? CARDIOVERSION N/A 02/15/2013  ? Procedure: CARDIOVERSION-BEDSIDE;  Surgeon: Darlin Coco, MD;  Location: [place];  Service: Cardiovascular;  Laterality: N/A;  ? CARDIOVERSION N/A 02/05/2011  ? Procedure: CARDIOVERSION;  Surgeon: Evans Lance, MD;  Location: [facility] CATH LAB;  Service: Cardiovascular;  Laterality: N/A;  ? COLONOSCOPY N/A 01/03/2018  ? Procedure: COLONOSCOPY;  Surgeon: Mauri Pole, MD;  Location: [facility] ENDOSCOPY;  Service: Endoscopy;  Laterality: N/A;  ? Convergent procedure  11/13/11  ? afib ablation at [facility]  ? EP IMPLANTABLE DEVICE N/A 02/09/2015  ? Procedure: Loop Recorder Removal;  Surgeon: [name], MD;  Location: [place] CV LAB;  Service: Cardiovascular;  Laterality: N/A;  ? ESOPHAGOGASTRODUODENOSCOPY (EGD) WITH PROPOFOL N/A 01/02/2018  ? Procedure: ESOPHAGOGASTRODUODENOSCOPY (EGD) WITH PROPOFOL;  Surgeon: Mauri Pole, MD;  Location: [place] ENDOSCOPY;  Service: Endoscopy;  Laterality: N/A;  ? loop recorder insertion Left 09/2011  ? Loop recorder removal  02/09/15  ?  MDT Reveal XT loop recorder removed by Dr Rayann Heman  ? POLYPECTOMY  01/03/2018  ? Procedure: POLYPECTOMY;  Surgeon: Mauri Pole, MD;  Location: [place];  Service: Endoscopy;;  ? TEE WITHOUT CARDIOVERSION N/A 09/08/2012  ? Procedure: TRANSESOPHAGEAL ECHOCARDIOGRAM (TEE);  Surgeon: Fay Records, MD;  Location: [facility] ENDOSCOPY;  Service: Cardiovascular;  Laterality: N/A;  ? TONSILLECTOMY    ? TONSILLECTOMY    ? US ECHOCARDIOGRAPHY  Aug 2012  ? Normal systolic function with EF 55% and slight LAE at 4.4cm  ? ? ?Allergies: ?Amiodarone ? ?Medications: ?Prior to Admission medications   ?Medication Sig Start Date End Date Taking? Authorizing Provider  ?amLODipine (NORVASC) 5 MG tablet Take one tablet by mouth daily 05/04/21   Sherran Needs, NP  ?amphetamine-dextroamphetamine (ADDERALL) 15 MG tablet Take 15 mg by mouth daily as needed (for additional focus).  04/02/11   [provider]  ?aspirin EC 81 MG tablet Take 1 tablet (81 mg total) by mouth daily. Swallow whole. 06/14/21   de Rosario Jacks, MD  ?atorvastatin (LIPITOR) 10 MG tablet Take 1 tablet (10 mg total) by mouth daily. 05/08/21   Sherran Needs, NP  ?dabigatran (PRADAXA) 150 MG CAPS capsule TAKE 1 CAPSULE BY MOUTH TWICE A DAY. KEEP UPCOMING APPOINTMENT. 05/08/21   Sherran Needs, NP  ?escitalopram (LEXAPRO) 20 MG tablet Take 20 mg by mouth Daily. Lexapro 07/07/10   [provider]  ?Melatonin 10 MG TABS Take 10 mg by mouth at bedtime.    [provider]  ?metoprolol tartrate (LOPRESSOR) 50 MG tablet TAKE 1 TABLET BY MOUTH TWICE A DAY 11/10/20   Sherran Needs, NP  ?Multiple Vitamins-Minerals (MULTIVITAMIN WITH MINERALS) tablet Take 1 tablet by mouth daily.    [provider]  ?rOPINIRole (REQUIP) 1 MG tablet Take 1 mg by mouth at bedtime.    [provider]  ?ticagrelor (BRILINTA) 90 MG TABS tablet Take 1 tablet (90 mg total) by mouth 2 (two) times daily. 06/14/21 07/14/21  de Rosario Jacks, MD  ?VYVANSE 70 MG capsule Take 70 mg by mouth Daily.  07/11/10   [provider]  ?  ? ?Family History  ?Problem Relation Age of Onset  ? Coronary artery disease Father   ? ? ?Social History  ? ?Socioeconomic History  ? Marital status: Married  ?  Spouse name: Not on file  ? Number of  children: Not on file  ? Years of education: Not on file  ? Highest education level: Not on file  ?Occupational History  ? Not on file  ?Tobacco Use  ? Smoking status: Never  ? Smokeless tobacco: Never  ?Vaping Use  ? Vaping Use: Never used  ?Substance and Sexual Activity  ? Alcohol use: No  ? Drug use: No  ? Sexual activity: Not on file  ?Other Topics Concern  ? Not on file  ?Social History Narrative  ? Pt lives in [place] alone.  Works at [employer]  ? ?Social Determinants of Health  ? ?Financial Resource Strain: Not on file  ?Food Insecurity: Not on file  ?Transportation Needs: Not on file  ?Physical Activity: Not on file  ?Stress: Not on file  ?Social Connections: Not on file  ? ? ?Review of Systems: A 12 point ROS discussed and pertinent positives are indicated in the HPI above.  All other systems are negative. ? ?Review of Systems  ?Constitutional:  Negative for appetite change and fatigue.  ?Respiratory:  Negative for cough and shortness of breath.   ?Cardiovascular:  Negative for chest pain and leg swelling.  ?Gastrointestinal:  Negative for abdominal pain, diarrhea, nausea and vomiting.  ?Neurological:  Negative for dizziness and headaches.  ? ?Vital Signs: ?Temp: 97.7, BP 168/89, HR 60, RR 17, 97% on RA ? ?Physical Exam ?Constitutional:   ?   General: She is not in acute distress. ?HENT:  ?   Mouth/Throat:  ?   Mouth: Mucous membranes are moist.  ?   Pharynx: Oropharynx is clear.  ?Cardiovascular:  ?   Rate and Rhythm: Normal rate. Rhythm irregular.  ?   Pulses: Normal pulses.  ?   Heart sounds: Normal heart sounds.  ?Pulmonary:  ?   Effort: Pulmonary effort is normal.  ?   Breath sounds: Normal breath sounds.  ?Abdominal:  ?   General: Bowel sounds are normal.  ?   Palpations: Abdomen is soft.  ?Skin: ?   General: Skin is warm and dry.  ?Neurological:  ?   Mental Status: She is alert and oriented to person, place, and time.  ?Psychiatric:     ?   Mood and Affect: Mood normal.      ?   Behavior: Behavior normal.     ?   Thought Content: Thought content normal.     ?   Judgment: Judgment normal.  ? ? ?Imaging: ?No results found. ? ?Labs: ? ?CBC: ?Recent Labs  ?  08/03/20 ?1619 01/25/21

## 2021-06-21 NOTE — Anesthesia Procedure Notes (Signed)
Arterial Line Insertion ?Start/End3/22/2023 7:50 AM, 06/21/2021 8:10 AM ?Performed by: Leonor Liv, CRNA, CRNA ? Patient location: Pre-op. ?Preanesthetic checklist: patient identified, IV checked, site marked, risks and benefits discussed, surgical consent, monitors and equipment checked, pre-op evaluation, timeout performed and anesthesia consent ?Lidocaine 1% used for infiltration ?Left, radial was placed ?Catheter size: 20 G ?Hand hygiene performed  and maximum sterile barriers used  ?Allen's test indicative of satisfactory collateral circulation ?Attempts: 1 ?Procedure performed without using ultrasound guided technique. ?Following insertion, dressing applied and Biopatch. ?Post procedure assessment: normal ? ?Patient tolerated the procedure well with no immediate complications. ? ? ?

## 2021-06-21 NOTE — Progress Notes (Signed)
Paged Dr. Debbrah Alar for Melatonin per pt's request.  In PTA med review, Melatonin 10 mg is listed.  Pt states she takes 20 mg.  I verified this amount with Dr. Debbrah Alar and the pt.  Order placed for Melatonin '20mg'$  at bedtime.   ?

## 2021-06-21 NOTE — Progress Notes (Addendum)
Upon assessment of R radial puncture site, minimal amount of bleeding present under clear Tegaderm dressing.  Dr. Norma Fredrickson notified and ordered to hold light pressure for 5 minutes.  Light pressure held for 5 minutes and bleeding stopped.  Tegaderm dressing changed and now clean and dry.   ?

## 2021-06-21 NOTE — Procedures (Addendum)
INTERVENTIONAL NEURORADIOLOGY BRIEF POSTPROCEDURE NOTE ? ?DIAGNOSTIC CEREBRAL ANGIOGRAM AND ENDOVASCULAR ANEURYSM TREATMENT ? ?Attending: Dr. Pedro Earls ? ?Assistant: None. ? ?Diagnosis: Right ICA ophthalmic segment aneurysm. ? ?Access site: Right distal radial artery. ? ?Access closure: Inflatable band. ? ?Anesthesia: General anesthesia. ? ?Medication used: 6000 unites heparin IV. ? ?Complications: None. ? ?Estimated blood loss: Negligible. ? ?Specimen: None. ? 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 Endovascular embolization performed with placement of a 5 x 21/14 mm FRED X flow diverter across the neck of the aneurysm. No evidence of thromboembolic or hemorrhagic complication. ? ?The patient tolerated the procedure well without incident or complication and is in stable condition.  ? ?  ?

## 2021-06-21 NOTE — Transfer of Care (Signed)
Immediate Anesthesia Transfer of Care Note ? ?Patient: Rose Ball ? ?Procedure(s) Performed: EMBOLIZATION ? ?Patient Location: PACU ? ?Anesthesia Type:General ? ?Level of Consciousness: awake, alert  and oriented ? ?Airway & Oxygen Therapy: Patient Spontanous Breathing and Patient connected to nasal cannula oxygen ? ?Post-op Assessment: Report given to RN, Post -op Vital signs reviewed and stable, Patient moving all extremities X 4 and Patient able to stick tongue midline ? ?Post vital signs: Reviewed and stable ? ?Last Vitals:  ?Vitals Value Taken Time  ?BP 99/57 06/21/21 1141  ?Temp    ?Pulse 64 06/21/21 1147  ?Resp 20 06/21/21 1147  ?SpO2 97 % 06/21/21 1147  ?Vitals shown include unvalidated device data. ? ?Last Pain:  ?Vitals:  ? 06/21/21 0635  ?TempSrc:   ?PainSc: 0-No pain  ?   ? ?  ? ?Complications: No notable events documented. ?

## 2021-06-21 NOTE — Progress Notes (Signed)
?  Transition of Care (TOC) Screening Note ? ? ?Patient Details  ?Name: Rose Ball ?Date of Birth: 20-Dec-1945 ? ? ?Transition of Care [facility]) CM/SW Contact:    ?Ella Bodo, RN ?Phone Number: ?06/21/2021, 1:58 PM ? ? ? ?Transition of Care Department [facility]) has reviewed patient and no TOC needs have been identified at this time. We will continue to monitor patient advancement through interdisciplinary progression rounds. If new patient transition needs arise, please place a TOC consult. ? ?Reinaldo Raddle, RN, BSN  ?Trauma/Neuro ICU Case Manager ?[phone] ? ?

## 2021-06-21 NOTE — Anesthesia Procedure Notes (Signed)
Procedure Name: Intubation ?Date/Time: 06/21/2021 8:54 AM ?Performed by: Leonor Liv, CRNA ?Pre-anesthesia Checklist: Patient identified, Emergency Drugs available, Suction available and Patient being monitored ?Patient Re-evaluated:Patient Re-evaluated prior to induction ?Oxygen Delivery Method: Circle System Utilized ?Preoxygenation: Pre-oxygenation with 100% oxygen ?Induction Type: IV induction ?Ventilation: Mask ventilation without difficulty ?Laryngoscope Size: Mac and 3 ?Grade View: Grade II ?Tube type: Oral ?Tube size: 7.0 mm ?Number of attempts: 1 ?Airway Equipment and Method: Stylet ?Placement Confirmation: ETT inserted through vocal cords under direct vision, positive ETCO2 and breath sounds checked- equal and bilateral ?Secured at: 21 cm ?Tube secured with: Tape (left) ?Dental Injury: Teeth and Oropharynx as per pre-operative assessment  ? ? ? ? ?

## 2021-06-21 NOTE — H&P (Deleted)
  The note originally documented on this encounter has been moved the the encounter in which it belongs.  

## 2021-06-22 MED ORDER — METOPROLOL TARTRATE 50 MG PO TABS
50.0000 mg | ORAL_TABLET | Freq: Once | ORAL | Status: AC
  Administered 2021-06-22: 50 mg via ORAL
  Filled 2021-06-22: qty 1

## 2021-06-22 MED ORDER — TICAGRELOR 60 MG PO TABS: 60.0000 mg | ORAL_TABLET | Freq: Two times a day (BID) | ORAL | 6 refills | Status: AC

## 2021-06-22 MED ORDER — AMLODIPINE BESYLATE 5 MG PO TABS
5.0000 mg | ORAL_TABLET | Freq: Once | ORAL | Status: AC
  Administered 2021-06-22: 5 mg via ORAL
  Filled 2021-06-22: qty 1

## 2021-06-22 NOTE — Discharge Summary (Addendum)
? ? ?Patient ID: ?Rose Ball ?MRN: [id number] ?DOB/AGE: 1945/12/23 76 y.o. ? ?Admit date: 06/21/2021 ?Discharge date: 06/22/2021 ? ?Supervising Physician: Karenann Cai, Erven Colla  ? ?Patient Status: [facility] - In-pt ? ?Admission Diagnoses: Brain aneurysm  ? ?Discharge Diagnoses:  ?Principal Problem: ?  Brain aneurysm ? ? ?Discharged Condition: good ? ?Hospital Course:  ? ?Patient presented to [place] for an cerebral angiogram with intervention for a right ICA superior hypophyseal aneurysm with Dr. Karenann Cai on 06/21/21.  ?The aneurysm was treated by placement of a 5 x 21/14 FRED flow diverter across the neck of the aneurysm. ? ?Procedure occurred without major complications and patient was transferred to floor in stable condition (extubated w/o difficulty, VSS, right wrist puncture site stable) for overnight observation. No major events occurred overnight. ?  ?Patient seen in the room this morning with Dr. Karenann Cai. ?Patient awake and alert, states that she is sleepy as she was not able to sleep well last night due to restless leg. No other complaints. Right wrist puncture site stable.  ? ?Patient was instructed to: ?- No  lifting more than 5 pounds for 1 week ?- Keep the right wrist puncture site clean and dry until fully heals, no submerging (sitting in a tub, swimming) for 7 days ?- Continue to take Pradaxa  150 mg twice a day ?- Start taking Brilinta 60 mg twice a day, instead of 90 mg  ?- Stop taking aspirin 81 mg ?- Follow up with PCP for HTN management  ? ?Plan to discharge home today and follow-up with Dr. Karenann Cai at [place] in 6 months.  ? ?Consults: None ? ?Significant Diagnostic Studies: labs: P2Y12 61.  ? ?Treatments: Endovascular treatment of a right ICA superior hypophyseal aneurysm with placement of a 5 x 21/14 FRED flow diverter across the neck of the aneurysm. ? ?Discharge Exam: ?Blood pressure (!) 151/81, pulse 93, temperature 98.3 ?F (36.8 ?C), resp. rate 20,  height '5\' 9"'$  (1.753 m), weight 213 lb (96.6 kg), SpO2 92 %. ? ?Physical Exam ?Vitals and nursing note reviewed.  ?Constitutional:   ?   General: Patient is not in acute distress. ?   Appearance: Normal appearance. Patient is not ill-appearing.  ?HENT:  ?   Head: Normocephalic and atraumatic.   ?Cardiovascular:  ?   Rate and Rhythm: Normal rate and regular rhythm.  ?   Pulses: Normal pulses, right RA 2+  ?Pulmonary:  ?   Effort: Pulmonary effort is normal.  ?Abdominal:  ?   General: Abdomen is flat.  ?Musculoskeletal:  ?   Cervical back: Neck supple.  ?Skin: ?   General: Skin is warm and dry.  ?   Coloration: Skin is not jaundiced or pale.  ?Positive dressing on right wrist puncture site. Site is unremarkable with no erythema, edema, tenderness, bleeding or drainage. Minimal amount of old, dry blood noted on the dressing. Dressing otherwise clean, dry, and intact.  ? ?Neurological:  ?   Mental Status: Patient is alert and oriented to person, place, and time.  ?Psychiatric:     ?   Mood and Affect: Mood normal.     ?   Behavior: Behavior normal.     ?   Judgment: Judgment normal.  ? ? ?Disposition: Discharge disposition: 01-Home or Self Care ? ? ? ? ? ? ?Discharge Instructions   ? ? Call MD for:   Complete by: As directed ?  ? Call MD for:  difficulty breathing,  headache or visual disturbances   Complete by: As directed ?  ? Call MD for:  extreme fatigue   Complete by: As directed ?  ? Call MD for:  hives   Complete by: As directed ?  ? Call MD for:  persistant dizziness or light-headedness   Complete by: As directed ?  ? Call MD for:  persistant nausea and vomiting   Complete by: As directed ?  ? Call MD for:  redness, tenderness, or signs of infection (pain, swelling, redness, odor or green/yellow discharge around incision site)   Complete by: As directed ?  ? Call MD for:  severe uncontrolled pain   Complete by: As directed ?  ? Call MD for:  temperature >100.4   Complete by: As directed ?  ? Diet - low sodium  heart healthy   Complete by: As directed ?  ? Discharge instructions   Complete by: As directed ?  ? Keep right wrist puncture site clean and dry until fully heals. No submerging (swimming, sitting in a tub) the wrist for 7 days. ? ?STOP taking aspirin, starting on 06/23/21.  ?START taking Brilinta 60 mg twice a day starting on 06/22/21, instead of 90 mg.  ?CONTINUE to take Pradaxa 150 mg twice a day.  ? Increase activity slowly   Complete by: As directed ?  ? Lifting restrictions   Complete by: As directed ?  ? No lifting more than 5 pounds with hands/arms  x 1 week.  ? ?  ? ?Allergies as of 06/22/2021   ? ?   Reactions  ? Amiodarone Other (See Comments)  ? REACTION: dyspnea  ? ?  ? ?  ?Medication List  ?  ? ?STOP taking these medications   ? ?aspirin EC 81 MG tablet ?  ? ?  ? ?TAKE these medications   ? ?amLODipine 5 MG tablet ?Commonly known as: NORVASC ?Take one tablet by mouth daily ?  ?amphetamine-dextroamphetamine 15 MG tablet ?Commonly known as: ADDERALL ?Take 15 mg by mouth daily as needed (for additional focus). ?  ?atorvastatin 10 MG tablet ?Commonly known as: LIPITOR ?Take 1 tablet (10 mg total) by mouth daily. ?  ?dabigatran 150 MG Caps capsule ?Commonly known as: PRADAXA ?TAKE 1 CAPSULE BY MOUTH TWICE A DAY. KEEP UPCOMING APPOINTMENT. ?  ?escitalopram 20 MG tablet ?Commonly known as: LEXAPRO ?Take 20 mg by mouth Daily. Lexapro ?  ?Melatonin 10 MG Tabs ?Take 10 mg by mouth at bedtime. ?  ?metoprolol tartrate 50 MG tablet ?Commonly known as: LOPRESSOR ?TAKE 1 TABLET BY MOUTH TWICE A DAY ?  ?multivitamin with minerals tablet ?Take 1 tablet by mouth daily. ?  ?rOPINIRole 1 MG tablet ?Commonly known as: REQUIP ?Take 1 mg by mouth at bedtime. ?  ?ticagrelor 60 MG Tabs tablet ?Commonly known as: BRILINTA ?Take 1 tablet (60 mg total) by mouth 2 (two) times daily. ?What changed:  ?medication strength ?how much to take ?  ?Vyvanse 70 MG capsule ?Generic drug: lisdexamfetamine ?Take 70 mg by mouth Daily. ?  ? ?  ? ?  Follow-up Information   ? ? de Rosario Jacks, MD Follow up.   ?Specialties: Radiology, Interventional Radiology ?Why: Follow up with Dr. Karenann Cai around 12/23/21 for diagnostic cerebral angiogram. Our schedulers will call you to set up the appointment. ?Contact information: ?[address] ?[address] ?[place] [address] ?[phone] ? ? ?  ?  ? ?  ?  ? ?  ?  ? ?  Electronically Signed: ?Tera Mater, PA-C ?06/22/2021, 9:16 AM ? ? ?I have spent Less Than 30 Minutes discharging [place].  ?

## 2021-06-22 NOTE — Anesthesia Postprocedure Evaluation (Signed)
Anesthesia Post Note ? ?Patient: Rose Ball ? ?Procedure(s) Performed: EMBOLIZATION ? ?  ? ?Patient location during evaluation: PACU ?Anesthesia Type: General ?Level of consciousness: sedated and patient cooperative ?Pain management: pain level controlled ?Vital Signs Assessment: post-procedure vital signs reviewed and stable ?Respiratory status: spontaneous breathing ?Cardiovascular status: stable ?Anesthetic complications: no ? ? ?No notable events documented. ? ?Last Vitals:  ?Vitals:  ? 06/22/21 0700 06/22/21 0715  ?BP: 138/69   ?Pulse: (!) 102 (!) 109  ?Resp: 19 19  ?Temp:    ?SpO2: 92% 91%  ?  ?Last Pain:  ?Vitals:  ? 06/22/21 0400  ?TempSrc: Oral  ?PainSc:   ? ? ?  ?  ?  ?  ?  ?  ? ?Nolon Nations ? ? ? ? ?

## 2021-06-22 NOTE — Discharge Instructions (Signed)
Information on my medicine - Pradaxa? (dabigatran) ? ?This medication education was reviewed with me or my healthcare representative as part of my discharge preparation.  The pharmacist that spoke with me during my hospital stay was:  Sherilyn Dacosta, [facility] ? ?Why was Pradaxa? prescribed for you? ?Pradaxa? was prescribed to treat blood clots that may have been found in the veins of your legs (deep vein thrombosis) or in your lungs (pulmonary embolism) and to reduce the risk of them occurring again.  Pradaxa? will take the place of the injectable anticoagulant medication you have been receiving for this condition for the last 5-10 days. ? ?What do you Need to know about PradAXa?? ?Take your Pradaxa? TWICE DAILY - one capsule in the morning and one tablet in the evening with or without food.  It would be best to take the doses about the same time each day. ? ?The capsules should not be broken, chewed or opened - they must be swallowed whole. ? ?Do not store Pradaxa in other medication containers - once the bottle is opened the Pradaxa should be used within FOUR months; throw away any capsules that haven?t been by that time. ? ?Take Pradaxa? exactly as prescribed by your doctor.  DO NOT stop taking Pradaxa? without talking to the doctor who prescribed the medication.  Refill your prescription before you run out. ? ?After discharge, you should have regular check-up appointments with your healthcare provider that is prescribing your Pradaxa?.  In the future your dose may need to be changed if your kidney function or weight changes by a significant amount. ? ?What do you do if you miss a dose? ?If you miss a dose, take it as soon as you remember on the same day.  If your next dose is less than 6 hours away, skip the missed dose.  Do not take two doses of PRADAXA at the same time. ? ?Important Safety Information ?A possible side effect of Pradaxa? is bleeding. You should call your healthcare provider right away if you  experience any of the following: ?Bleeding from an injury or your nose that does not stop. ?Unusual colored urine (red or dark brown) or unusual colored stools (red or black). ?Unusual bruising for unknown reasons. ?A serious fall or if you hit your head (even if there is no bleeding). ? ?Some medicines may interact with Pradaxa? and might increase your risk of bleeding or clotting while on Pradaxa?Marland Kitchen To help avoid this, consult your healthcare provider or pharmacist prior to using any new prescription or non-prescription medications, including herbals, vitamins, non-steroidal anti-inflammatory drugs (NSAIDs) and supplements. ? ?This website has more information on Pradaxa? (dabigatran): www.RuleEnforcement.cz.  ?

## 2021-06-27 LAB — PLATELET INHIBITION P2Y12

## 2021-07-10 ENCOUNTER — Other Ambulatory Visit (HOSPITAL_COMMUNITY): Payer: Self-pay | Admitting: Nurse Practitioner

## 2021-08-01 ENCOUNTER — Ambulatory Visit: Payer: PPO | Admitting: Family Medicine

## 2021-08-18 ENCOUNTER — Ambulatory Visit: Payer: PPO | Admitting: Family Medicine

## 2021-08-30 ENCOUNTER — Ambulatory Visit: Payer: PPO | Admitting: Family Medicine

## 2021-09-26 ENCOUNTER — Other Ambulatory Visit (HOSPITAL_COMMUNITY): Payer: Self-pay | Admitting: Nurse Practitioner

## 2021-10-31 ENCOUNTER — Other Ambulatory Visit (HOSPITAL_COMMUNITY): Payer: Self-pay | Admitting: Nurse Practitioner

## 2022-01-11 ENCOUNTER — Other Ambulatory Visit (HOSPITAL_COMMUNITY): Payer: Self-pay | Admitting: Nurse Practitioner

## 2022-02-02 ENCOUNTER — Other Ambulatory Visit (HOSPITAL_COMMUNITY): Payer: Self-pay | Admitting: Nurse Practitioner

## 2022-02-06 ENCOUNTER — Other Ambulatory Visit (HOSPITAL_COMMUNITY): Payer: Self-pay | Admitting: Nurse Practitioner

## 2022-02-17 ENCOUNTER — Other Ambulatory Visit (HOSPITAL_COMMUNITY): Payer: Self-pay | Admitting: Nurse Practitioner

## 2022-03-19 ENCOUNTER — Other Ambulatory Visit (HOSPITAL_COMMUNITY): Payer: Self-pay | Admitting: Nurse Practitioner

## 2022-03-31 ENCOUNTER — Other Ambulatory Visit (HOSPITAL_COMMUNITY): Payer: Self-pay | Admitting: Nurse Practitioner

## 2022-05-02 ENCOUNTER — Other Ambulatory Visit (HOSPITAL_COMMUNITY): Payer: Self-pay | Admitting: Nurse Practitioner

## 2022-05-10 ENCOUNTER — Encounter (HOSPITAL_COMMUNITY): Payer: Self-pay | Admitting: *Deleted

## 2022-05-21 ENCOUNTER — Other Ambulatory Visit: Payer: Self-pay

## 2022-05-21 ENCOUNTER — Emergency Department (HOSPITAL_BASED_OUTPATIENT_CLINIC_OR_DEPARTMENT_OTHER)
Admission: EM | Admit: 2022-05-21 | Discharge: 2022-05-21 | Disposition: A | Discharge status: Home / Self Care | Payer: PPO | Attending: Emergency Medicine | Admitting: Emergency Medicine

## 2022-05-21 ENCOUNTER — Encounter (HOSPITAL_BASED_OUTPATIENT_CLINIC_OR_DEPARTMENT_OTHER): Payer: Self-pay | Admitting: Emergency Medicine

## 2022-05-21 ENCOUNTER — Emergency Department (HOSPITAL_BASED_OUTPATIENT_CLINIC_OR_DEPARTMENT_OTHER): Payer: PPO

## 2022-05-21 DIAGNOSIS — I129 Hypertensive chronic kidney disease with stage 1 through stage 4 chronic kidney disease, or unspecified chronic kidney disease: Secondary | ICD-10-CM | POA: Diagnosis not present

## 2022-05-21 DIAGNOSIS — N1832 Chronic kidney disease, stage 3b: Secondary | ICD-10-CM | POA: Diagnosis not present

## 2022-05-21 DIAGNOSIS — Z8673 Personal history of transient ischemic attack (TIA), and cerebral infarction without residual deficits: Secondary | ICD-10-CM | POA: Insufficient documentation

## 2022-05-21 DIAGNOSIS — K1121 Acute sialoadenitis: Secondary | ICD-10-CM | POA: Insufficient documentation

## 2022-05-21 DIAGNOSIS — R600 Localized edema: Secondary | ICD-10-CM | POA: Diagnosis present

## 2022-05-21 LAB — BASIC METABOLIC PANEL
Anion gap: 5 (ref 5–15)
BUN: 30 mg/dL — ABNORMAL HIGH (ref 8–23)
CO2: 26 mmol/L (ref 22–32)
Calcium: 8.6 mg/dL — ABNORMAL LOW (ref 8.9–10.3)
Chloride: 104 mmol/L (ref 98–111)
Creatinine, Ser: 1.74 mg/dL — ABNORMAL HIGH (ref 0.44–1.00)
GFR, Estimated: 30 mL/min — ABNORMAL LOW (ref >60)
Glucose, Bld: 108 mg/dL — ABNORMAL HIGH (ref 70–99)
Potassium: 5 mmol/L (ref 3.5–5.1)
Sodium: 135 mmol/L (ref 135–145)

## 2022-05-21 LAB — CBC WITH DIFFERENTIAL/PLATELET
Abs Immature Granulocytes: 0.07 10*3/uL (ref 0.00–0.07)
Basophils Absolute: 0 10*3/uL (ref 0.0–0.1)
Basophils Relative: 0 %
Eosinophils Absolute: 0.2 10*3/uL (ref 0.0–0.5)
Eosinophils Relative: 1 %
HCT: 40.8 % (ref 36.0–46.0)
Hemoglobin: 13.2 g/dL (ref 12.0–15.0)
Immature Granulocytes: 1 %
Lymphocytes Relative: 12 %
Lymphs Abs: 1.7 10*3/uL (ref 0.7–4.0)
MCH: 29.3 pg (ref 26.0–34.0)
MCHC: 32.4 g/dL (ref 30.0–36.0)
MCV: 90.5 fL (ref 80.0–100.0)
Monocytes Absolute: 1.4 10*3/uL — ABNORMAL HIGH (ref 0.1–1.0)
Monocytes Relative: 9 %
Neutro Abs: 11.2 10*3/uL — ABNORMAL HIGH (ref 1.7–7.7)
Neutrophils Relative %: 77 %
Platelets: 247 10*3/uL (ref 150–400)
RBC: 4.51 MIL/uL (ref 3.87–5.11)
RDW: 13.6 % (ref 11.5–15.5)
WBC: 14.5 10*3/uL — ABNORMAL HIGH (ref 4.0–10.5)
nRBC: 0 % (ref 0.0–0.2)

## 2022-05-21 MED ORDER — AMOXICILLIN-POT CLAVULANATE 875-125 MG PO TABS: 1.0000 | ORAL_TABLET | Freq: Two times a day (BID) | ORAL | 0 refills | Status: DC

## 2022-05-21 MED ORDER — SODIUM CHLORIDE 0.9 % IV BOLUS: 1000.0000 mL | Freq: Once | INTRAVENOUS | Status: DC

## 2022-05-21 MED ORDER — PILOCARPINE HCL 5 MG PO TABS: ORAL_TABLET | ORAL | 0 refills | Status: DC

## 2022-05-21 MED ORDER — AMOXICILLIN-POT CLAVULANATE 875-125 MG PO TABS
1.0000 | ORAL_TABLET | Freq: Once | ORAL | Status: AC
  Administered 2022-05-21: 1 via ORAL
  Filled 2022-05-21: qty 1

## 2022-05-21 NOTE — ED Provider Notes (Signed)
[place] DEPT [facility] Provider Note: Georgena Spurling, MD, FACEP  CSN: [id number] MRN: [id number] ARRIVAL: 05/21/22 at [place]: [place]  Swelling (face/neck)   HISTORY OF PRESENT ILLNESS  05/21/22 4:40 AM Rose Ball is a 77 y.o. female with a firm, painful mass anterior/inferior to her left ear that has developed over the past 5 days.  Rates associated pain as a 5 out of 10.  It is worse with palpation or movement of the jaw.  She is not having any difficulty speaking, swallowing or breathing.  She has noticed no painful teeth.   Past Medical History:  Diagnosis Date   ADHD (attention deficit hyperactivity disorder)    Anxiety    Arthritis    Attention deficit    on Vyvanse   Chronic anticoagulation    Depression    Dyslipidemia    GI bleed 2022   Hypertension    Hypokalemia    Morbid obesity ([place])    Palpitations    Persistent atrial fibrillation ([place]) 05/27/14 Chadsvasc score of 4   s/p cardioversion Sept 2012; failed   Pneumonia    Stroke [facility])    TIA (transient ischemic attack)    12/2020    Past Surgical History:  Procedure Laterality Date   BIOPSY  01/02/2018   Procedure: BIOPSY;  Surgeon: Mauri Pole, MD;  Location: [facility] ENDOSCOPY;  Service: Endoscopy;;   BREAST BIOPSY     CARDIOVERSION  Sept 2012   failed   CARDIOVERSION N/A 09/08/2012   Procedure: CARDIOVERSION;  Surgeon: Fay Records, MD;  Location: [place];  Service: Cardiovascular;  Laterality: N/A;   CARDIOVERSION N/A 02/15/2013   Procedure: CARDIOVERSION-BEDSIDE;  Surgeon: Darlin Coco, MD;  Location: [place];  Service: Cardiovascular;  Laterality: N/A;   CARDIOVERSION N/A 02/05/2011   Procedure: CARDIOVERSION;  Surgeon: Evans Lance, MD;  Location: [facility] CATH LAB;  Service: Cardiovascular;  Laterality: N/A;   COLONOSCOPY N/A 01/03/2018   Procedure: COLONOSCOPY;  Surgeon: Mauri Pole, MD;  Location: [place] ENDOSCOPY;  Service: Endoscopy;  Laterality: N/A;    Convergent procedure  11/13/11   afib ablation at [place] N/A 02/09/2015   Procedure: Loop Recorder Removal;  Surgeon: [name], MD;  Location: [place] CV LAB;  Service: Cardiovascular;  Laterality: N/A;   ESOPHAGOGASTRODUODENOSCOPY (EGD) WITH PROPOFOL N/A 01/02/2018   Procedure: ESOPHAGOGASTRODUODENOSCOPY (EGD) WITH PROPOFOL;  Surgeon: Mauri Pole, MD;  Location: [place] ENDOSCOPY;  Service: Endoscopy;  Laterality: N/A;   IR 3D INDEPENDENT WKST  06/21/2021   IR ANGIO INTRA EXTRACRAN SEL COM CAROTID INNOMINATE UNI L MOD SED  06/21/2021   IR ANGIO INTRA EXTRACRAN SEL INTERNAL CAROTID UNI R MOD SED  06/21/2021   IR ANGIO VERTEBRAL SEL SUBCLAVIAN INNOMINATE BILAT MOD SED  06/21/2021   IR ANGIOGRAM FOLLOW UP STUDY  06/21/2021   IR CT HEAD LTD  06/21/2021   IR NEURO EACH ADD'L AFTER BASIC UNI RIGHT (MS)  06/21/2021   IR TRANSCATH/EMBOLIZ  06/21/2021   IR US GUIDE VASC ACCESS RIGHT  06/21/2021   loop recorder insertion Left 09/2011   Loop recorder removal  02/09/15   MDT Reveal XT loop recorder removed by Dr Rayann Heman   POLYPECTOMY  01/03/2018   Procedure: POLYPECTOMY;  Surgeon: Mauri Pole, MD;  Location: [facility] ENDOSCOPY;  Service: Endoscopy;;   RADIOLOGY WITH ANESTHESIA N/A 06/21/2021   Procedure: EMBOLIZATION;  Surgeon: Pedro Earls, MD;  Location: [place];  Service:  Radiology;  Laterality: N/A;   TEE WITHOUT CARDIOVERSION N/A 09/08/2012   Procedure: TRANSESOPHAGEAL ECHOCARDIOGRAM (TEE);  Surgeon: Fay Records, MD;  Location: [facility] ENDOSCOPY;  Service: Cardiovascular;  Laterality: N/A;   TONSILLECTOMY     TONSILLECTOMY     US ECHOCARDIOGRAPHY  Aug 0000000   Normal systolic function with EF 55% and slight LAE at 4.4cm    Family History  Problem Relation Age of Onset   Coronary artery disease Father     Social History   Tobacco Use   Smoking status: Never   Smokeless tobacco: Never  Vaping Use   Vaping Use: Never used  Substance Use Topics   Alcohol use:  No   Drug use: No    Prior to Admission medications   Medication Sig Start Date End Date Taking? Authorizing Provider  amoxicillin-clavulanate (AUGMENTIN) 875-125 MG tablet Take 1 tablet by mouth every 12 (twelve) hours. 05/21/22  Yes Mala Gibbard, MD  pilocarpine (SALAGEN) 5 MG tablet Take 1 tablet 3 times a day to increase saliva flow. 05/21/22  Yes Georgina Krist, MD  amLODipine (NORVASC) 5 MG tablet Take 1 tablet (5 mg total) by mouth daily. TAKE 1/2 TABLET BY MOUTH DAILY 10/31/21   Sherran Needs, NP  amphetamine-dextroamphetamine (ADDERALL) 15 MG tablet Take 15 mg by mouth daily as needed (for additional focus).  04/02/11   [provider]  atorvastatin (LIPITOR) 10 MG tablet TAKE 1 TABLET BY MOUTH EVERY DAY 09/26/21   Sherran Needs, NP  dabigatran (PRADAXA) 150 MG CAPS capsule TAKE 1 CAPSULE BY MOUTH TWICE A DAY. KEEP UPCOMING APPOINTMENT. 09/26/21   Sherran Needs, NP  escitalopram (LEXAPRO) 20 MG tablet Take 20 mg by mouth Daily. Lexapro 07/07/10   [provider]  Melatonin 10 MG TABS Take 10 mg by mouth at bedtime.    [provider]  metoprolol tartrate (LOPRESSOR) 50 MG tablet Take 1 tablet (50 mg total) by mouth 2 (two) times daily. No refills without appt [id number] 04/03/22   Sherran Needs, NP  Multiple Vitamins-Minerals (MULTIVITAMIN WITH MINERALS) tablet Take 1 tablet by mouth daily.    [provider]  rOPINIRole (REQUIP) 1 MG tablet Take 1 mg by mouth at bedtime.    [provider]  VYVANSE 70 MG capsule Take 70 mg by mouth Daily.  07/11/10   [provider]    Allergies Amiodarone   REVIEW OF SYSTEMS  Negative except as noted here or in the History of Present Illness.   PHYSICAL EXAMINATION  Initial Vital Signs Blood pressure (!) 158/80, pulse 63, temperature 98 F (36.7 C), temperature source Oral, resp. rate 20, height 5' 9"$  (1.753 m), weight 88.5 kg, SpO2 93 %.  Examination General: Well-developed,  well-nourished female in no acute distress; appearance consistent with age of record HENT: normocephalic; atraumatic; tender, firm mass of left side of face anterior and inferior to the left ear:      Eyes: pupils equal, round and reactive to light; extraocular muscles intact Neck: supple Heart: Irregular rhythm Lungs: clear to auscultation bilaterally Abdomen: soft; nondistended; nontender; bowel sounds present Extremities: No deformity; full range of motion; pulses normal Neurologic: Awake, alert and oriented; motor function intact in all extremities and symmetric; no facial droop Skin: Warm and dry Psychiatric: Normal mood and affect   RESULTS  Summary of this visit's results, reviewed and interpreted by myself:   EKG Interpretation  Date/Time:    Ventricular Rate:    PR Interval:  QRS Duration:   QT Interval:    QTC Calculation:   R Axis:     Text Interpretation:         Laboratory Studies: Results for orders placed or performed during the hospital encounter of 05/21/22 (from the past 24 hour(s))  CBC with Differential     Status: Abnormal   Collection Time: 05/21/22  4:41 AM  Result Value Ref Range   WBC 14.5 (H) 4.0 - 10.5 K/uL   RBC 4.51 3.87 - 5.11 MIL/uL   Hemoglobin 13.2 12.0 - 15.0 g/dL   HCT 40.8 36.0 - 46.0 %   MCV 90.5 80.0 - 100.0 fL   MCH 29.3 26.0 - 34.0 pg   MCHC 32.4 30.0 - 36.0 g/dL   RDW 13.6 11.5 - 15.5 %   Platelets 247 150 - 400 K/uL   nRBC 0.0 0.0 - 0.2 %   Neutrophils Relative % 77 %   Neutro Abs 11.2 (H) 1.7 - 7.7 K/uL   Lymphocytes Relative 12 %   Lymphs Abs 1.7 0.7 - 4.0 K/uL   Monocytes Relative 9 %   Monocytes Absolute 1.4 (H) 0.1 - 1.0 K/uL   Eosinophils Relative 1 %   Eosinophils Absolute 0.2 0.0 - 0.5 K/uL   Basophils Relative 0 %   Basophils Absolute 0.0 0.0 - 0.1 K/uL   Immature Granulocytes 1 %   Abs Immature Granulocytes 0.07 0.00 - 0.07 K/uL  Basic metabolic panel     Status: Abnormal   Collection Time: 05/21/22   4:41 AM  Result Value Ref Range   Sodium 135 135 - 145 mmol/L   Potassium 5.0 3.5 - 5.1 mmol/L   Chloride 104 98 - 111 mmol/L   CO2 26 22 - 32 mmol/L   Glucose, Bld 108 (H) 70 - 99 mg/dL   BUN 30 (H) 8 - 23 mg/dL   Creatinine, Ser 1.74 (H) 0.44 - 1.00 mg/dL   Calcium 8.6 (L) 8.9 - 10.3 mg/dL   GFR, Estimated 30 (L) >60 mL/min   Anion gap 5 5 - 15   Imaging Studies: CT SOFT TISSUE NECK WO CONTRAST  Result Date: 05/21/2022 CLINICAL DATA:  Non pulsatile neck mass. Suspected parotiditis on the left. EXAM: CT NECK WITHOUT CONTRAST TECHNIQUE: Multidetector CT imaging of the neck was performed following the standard protocol without intravenous contrast. RADIATION DOSE REDUCTION: This exam was performed according to the departmental dose-optimization program which includes automated exposure control, adjustment of the mA and/or kV according to patient size and/or use of iterative reconstruction technique. COMPARISON:  None Available. FINDINGS: Pharynx and larynx: No submucosal edema or masslike finding. Tonsilliths. Salivary glands: Expansion with stranding at the left parotid. No underlying calculus. No evident collection. Thyroid: Normal Lymph nodes: Expected enlargement of lymph nodes asymmetric to the left neck with reactive pattern. Vascular: Mild atheromatous calcification Limited intracranial: Negative.  Right ICA pipeline stent. Visualized orbits: Negative Mastoids and visualized paranasal sinuses: Clear Skeleton: Generalized cervical spine degeneration. C4-5 facet ankylosis. Upper chest: Clear apical lungs. Noncontrast study due to chronic renal failure. IMPRESSION: Left parotiditis.  No underlying calculus or findings of collection. Electronically Signed   By: Jorje Guild M.D.   On: 05/21/2022 06:07    ED COURSE and MDM  Nursing notes, initial and subsequent vitals signs, including pulse oximetry, reviewed and interpreted by myself.  Vitals:   05/21/22 0431 05/21/22 0436  BP:  (!)  158/80  Pulse:  63  Resp:  20  Temp:  98 F (  36.7 C)  TempSrc:  Oral  SpO2:  93%  Weight: 88.5 kg   Height: 5' 9"$  (1.753 m)    Medications  amoxicillin-clavulanate (AUGMENTIN) 875-125 MG per tablet 1 tablet (has no administration in time range)    5:36 AM I suspect, given the location of the patient's mass, that she has parotid sialadenitis.  Per review of her past laboratory work shows that she has had chronic kidney disease with a creatinine typically over 2.  She was not aware that she has chronic kidney disease nor has she ever been referred to a nephrologist.  Normally we would use IV contrast in this case but given her chronic renal insufficiency we will obtain a CT of the patient's neck without IV contrast.  This was recommended by the radiologist on-call.   6:12 AM CT scan confirms diagnosis of left parotid sialadenitis.  The patient is nontoxic-appearing and I believe she can be treated as an outpatient on Augmentin.  We will also treat her with a sialagogue.  PROCEDURES  Procedures   ED DIAGNOSES     ICD-10-CM   1. Acute parotitis  K11.21     2. Stage 3b chronic kidney disease ([place])  N18.32          Roneisha Stern, Jenny Reichmann, MD 05/21/22 [phone]

## 2022-05-21 NOTE — ED Triage Notes (Signed)
Pt states she noticed swelling to the L side of her neck that started 5 days ago. Denies dysphagia, sore throat, or difficulty breathing. She pinpoints area to directly below her L ear. Reports pain to the area as well. Skin around ear lobe red, obvious swelling to area. No drainage noted.

## 2022-05-28 ENCOUNTER — Other Ambulatory Visit (HOSPITAL_COMMUNITY): Payer: Self-pay | Admitting: Nurse Practitioner

## 2022-06-05 ENCOUNTER — Other Ambulatory Visit (HOSPITAL_COMMUNITY): Payer: Self-pay | Admitting: Nurse Practitioner

## 2022-06-30 ENCOUNTER — Other Ambulatory Visit (HOSPITAL_COMMUNITY): Payer: Self-pay | Admitting: Nurse Practitioner

## 2022-07-18 ENCOUNTER — Ambulatory Visit (HOSPITAL_COMMUNITY): Payer: PPO | Admitting: Internal Medicine

## 2022-07-23 ENCOUNTER — Ambulatory Visit (HOSPITAL_COMMUNITY)
Admission: RE | Admit: 2022-07-23 | Discharge: 2022-07-23 | Disposition: A | Discharge status: Home / Self Care | Payer: PPO | Source: Ambulatory Visit | Attending: Internal Medicine | Admitting: Internal Medicine

## 2022-07-23 VITALS — BP 184/100 | HR 94 | Ht 69.0 in | Wt 199.4 lb

## 2022-07-23 DIAGNOSIS — R531 Weakness: Secondary | ICD-10-CM | POA: Insufficient documentation

## 2022-07-23 DIAGNOSIS — I4819 Other persistent atrial fibrillation: Secondary | ICD-10-CM | POA: Diagnosis not present

## 2022-07-23 DIAGNOSIS — I10 Essential (primary) hypertension: Secondary | ICD-10-CM

## 2022-07-23 DIAGNOSIS — Z79899 Other long term (current) drug therapy: Secondary | ICD-10-CM | POA: Diagnosis not present

## 2022-07-23 DIAGNOSIS — R42 Dizziness and giddiness: Secondary | ICD-10-CM | POA: Insufficient documentation

## 2022-07-23 DIAGNOSIS — Z8673 Personal history of transient ischemic attack (TIA), and cerebral infarction without residual deficits: Secondary | ICD-10-CM | POA: Diagnosis not present

## 2022-07-23 DIAGNOSIS — D6869 Other thrombophilia: Secondary | ICD-10-CM | POA: Diagnosis not present

## 2022-07-23 DIAGNOSIS — Z91148 Patient's other noncompliance with medication regimen for other reason: Secondary | ICD-10-CM | POA: Diagnosis not present

## 2022-07-23 MED ORDER — AMLODIPINE BESYLATE 5 MG PO TABS: 5.0000 mg | ORAL_TABLET | Freq: Every day | ORAL | 2 refills | Status: DC

## 2022-07-23 MED ORDER — METOPROLOL TARTRATE 50 MG PO TABS: 50.0000 mg | ORAL_TABLET | Freq: Two times a day (BID) | ORAL | 2 refills | Status: DC

## 2022-07-23 MED ORDER — DABIGATRAN ETEXILATE MESYLATE 150 MG PO CAPS: 150.0000 mg | ORAL_CAPSULE | Freq: Two times a day (BID) | ORAL | 2 refills | Status: DC

## 2022-07-23 NOTE — Progress Notes (Signed)
Patient ID: Rose Ball, female   DOB: 1945/04/06, 77 y.o.   MRN: [id number]     Primary Care Physician: Patient, No Pcp Per Referring Physician: Dr. Johney Frame Neurology: Dr. Janina Mayo Rose Ball is a 77 y.o. female with a h/o afib, s/p convergent procedure 2013, on tikosyn. She is in the clinic for f/u. She had   GI bleed, last October, was taking naprosyn with pradaxa for low back pain. Had presenting HGB of 6.9, received 3 units of blood. Was found to have a gastric ulcer, endoscopy and colonoscopy was done in the hospital. She was seen f/u by GI but has not had any further f/u of her blood count as she still does not have a PCP.  This in the past has been  stressed to the pt importance of getting established with one. She states that she is working on it.   She denies  feeling   weakness or shortness of breath with exertion as she did with the anemia.. She has remained in SR during this time. No further bleeding issues.   F/u in afib clinic, 05/19/19. Pt is here for Tikosyn surveillance. Reports no afib. Feels well. Still has not established with a PCP.   F/u in afib clinic, 12/24/19. Does not report any  afib. Compliant with Tikosyn and Pradaxa with a CHA2DS2VASc score of 4. Fasting blood work to f/u on refill of Lipitor as she has not established with a PCP. BP is elevated today. States she took BB just minutes ago in her car. No concerns today.   F/u in afib clinic, 08/03/20. No afib to report. On last visit, her Tikosyn dose was reduced for decline in kidney function.SHe was referred to nephrology and I think her function stabilized. Will check again today. In conversation, pt states that she has been opening the capsule of 500 mcg  tikosyn and spitting the capsules into 2 doses. Informed pt that this is dangerous and this is not recommended. She states she did not want to waste money . I will re send the 250 mcg capsule and repeat EKG when back on correct dose in 2-3 days.Ekg shows  atrial flutter today at 97 bpm.    F/u in afib clinic, 04/25/21 for afib.I see in Epic  where she was sent to the ER 01/25/21 when she was talking to her therapist and could not get her words out.  MRI was done, dx with TIA and an aneurysm was found . She was discharged to f/u with Neurology and interventional radiologist. She did not go to the radiologist. When she saw Neurology, he urged her to make another appointment. I can see where his office tried twice to call her to schedule. I asked her today if she did , she replied she didn't and did not seem overly enthused to schedule the appointment. I urged her to do this again today based on the concerns of Dr. Teresa Coombs.. I had also referred her to nephrology in the past  for her CKD and she no longer sees them.  She has no PCP to monitor her BP/kidney function/other primary issues. She has been asked numerous times to establish with a PCP. She is in SR today but states that she will be often in afib. Tikosyn had to be stopped for elevated creatinine and prolonged  qt interval. She remains on pradaxa, renal function calculated to crcl/min and she remains on the appropriate dose. BP is very elevated today at 180/86. She  does not monitor  at home.   F/u in afib clinic, 07/23/22. She is currently in SR. She cannot recall last episode of Afib. She is doing well overall but admits to not having taken either amlodipine or metoprolol for months. Her BP is very elevated today. She feels drained and a little lightheaded but denies blurry vision, headache, chest pain. She has been compliant with Pradaxa 150 mg BID. No bleeding concerns. 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   Today, she denies symptoms of palpitations, chest pain,  orthopnea, PND, lower extremity edema, dizziness, presyncope, syncope, or neurologic sequela.  The patient is tolerating medications without difficulties and is otherwise without complaint today.   Past  Medical History:  Diagnosis Date   ADHD (attention deficit hyperactivity disorder)    Anxiety    Arthritis    Attention deficit    on Vyvanse   Chronic anticoagulation    Depression    Dyslipidemia    GI bleed 2022   Hypertension    Hypokalemia    Morbid obesity ([facility])    Palpitations    Persistent atrial fibrillation ([facility]) 05/27/14 Chadsvasc score of 4   s/p cardioversion Sept 2012; failed   Pneumonia    Stroke [facility])    TIA (transient ischemic attack)    12/2020   Past Surgical History:  Procedure Laterality Date   BIOPSY  01/02/2018   Procedure: BIOPSY;  Surgeon: Napoleon Form, MD;  Location: [facility] ENDOSCOPY;  Service: Endoscopy;;   BREAST BIOPSY     CARDIOVERSION  Sept 2012   failed   CARDIOVERSION N/A 09/08/2012   Procedure: CARDIOVERSION;  Surgeon: Pricilla Riffle, MD;  Location: [facility] ENDOSCOPY;  Service: Cardiovascular;  Laterality: N/A;   CARDIOVERSION N/A 02/15/2013   Procedure: CARDIOVERSION-BEDSIDE;  Surgeon: Cassell Clement, MD;  Location: [facility];  Service: Cardiovascular;  Laterality: N/A;   CARDIOVERSION N/A 02/05/2011   Procedure: CARDIOVERSION;  Surgeon: Marinus Maw, MD;  Location: [facility] CATH LAB;  Service: Cardiovascular;  Laterality: N/A;   COLONOSCOPY N/A 01/03/2018   Procedure: COLONOSCOPY;  Surgeon: Napoleon Form, MD;  Location: [facility] ENDOSCOPY;  Service: Endoscopy;  Laterality: N/A;   Convergent procedure  11/13/11   afib ablation at [facility]   EP IMPLANTABLE DEVICE N/A 02/09/2015   Procedure: Loop Recorder Removal;  Surgeon: Hillis Range, MD;  Location: [facility] INVASIVE CV LAB;  Service: Cardiovascular;  Laterality: N/A;   ESOPHAGOGASTRODUODENOSCOPY (EGD) WITH PROPOFOL N/A 01/02/2018   Procedure: ESOPHAGOGASTRODUODENOSCOPY (EGD) WITH PROPOFOL;  Surgeon: Napoleon Form, MD;  Location: [facility] ENDOSCOPY;  Service: Endoscopy;  Laterality: N/A;   IR 3D INDEPENDENT WKST  06/21/2021   IR ANGIO INTRA EXTRACRAN SEL COM CAROTID INNOMINATE UNI L MOD SED  06/21/2021   IR ANGIO  INTRA EXTRACRAN SEL INTERNAL CAROTID UNI R MOD SED  06/21/2021   IR ANGIO VERTEBRAL SEL SUBCLAVIAN INNOMINATE BILAT MOD SED  06/21/2021   IR ANGIOGRAM FOLLOW UP STUDY  06/21/2021   IR CT HEAD LTD  06/21/2021   IR NEURO EACH ADD'L AFTER BASIC UNI RIGHT (MS)  06/21/2021   IR TRANSCATH/EMBOLIZ  06/21/2021   IR US GUIDE VASC ACCESS RIGHT  06/21/2021   loop recorder insertion Left 09/2011   Loop recorder removal  02/09/15   MDT Reveal XT loop recorder removed by Dr Johney Frame   POLYPECTOMY  01/03/2018   Procedure: POLYPECTOMY;  Surgeon: Napoleon Form, MD;  Location: [facility] ENDOSCOPY;  Service: Endoscopy;;   RADIOLOGY WITH ANESTHESIA N/A 06/21/2021  Procedure: EMBOLIZATION;  Surgeon: de Macedo Rodrigues, [place], MD;  Location: [facility] OR;  Service: Radiology;  Laterality: N/A;   TEE WITHOUT CARDIOVERSION N/A 09/08/2012   Procedure: TRANSESOPHAGEAL ECHOCARDIOGRAM (TEE);  Surgeon: Pricilla Riffle, MD;  Location: [facility] ENDOSCOPY;  Service: Cardiovascular;  Laterality: N/A;   TONSILLECTOMY     TONSILLECTOMY     US ECHOCARDIOGRAPHY  Aug 2012   Normal systolic function with EF 55% and slight LAE at 4.4cm    Current Outpatient Medications  Medication Sig Dispense Refill   amLODipine (NORVASC) 5 MG tablet Take 1 tablet (5 mg total) by mouth daily. TAKE 1/2 TABLET BY MOUTH DAILY 90 tablet 1   amoxicillin-clavulanate (AUGMENTIN) 875-125 MG tablet Take 1 tablet by mouth every 12 (twelve) hours. 14 tablet 0   amphetamine-dextroamphetamine (ADDERALL) 15 MG tablet Take 15 mg by mouth daily as needed (for additional focus).      atorvastatin (LIPITOR) 10 MG tablet TAKE 1 TABLET BY MOUTH EVERY DAY 90 tablet 2   dabigatran (PRADAXA) 150 MG CAPS capsule TAKE 1 CAPSULE BY MOUTH TWICE A DAY. KEEP UPCOMING APPOINTMENT. 180 capsule 2   escitalopram (LEXAPRO) 20 MG tablet Take 20 mg by mouth Daily. Lexapro     Melatonin 10 MG TABS Take 10 mg by mouth at bedtime.     metoprolol tartrate (LOPRESSOR) 50 MG tablet TAKE 1 TABLET (50 MG  TOTAL) BY MOUTH 2 (TWO) TIMES DAILY. NO REFILLS WITHOUT APPT [id number] 30 tablet 0   Multiple Vitamins-Minerals (MULTIVITAMIN WITH MINERALS) tablet Take 1 tablet by mouth daily.     pilocarpine (SALAGEN) 5 MG tablet Take 1 tablet 3 times a day to increase saliva flow. 21 tablet 0   rOPINIRole (REQUIP) 1 MG tablet Take 1 mg by mouth at bedtime.     VYVANSE 70 MG capsule Take 70 mg by mouth Daily.      No current facility-administered medications for this encounter.    Allergies  Allergen Reactions   Amiodarone Other (See Comments)    REACTION: dyspnea    Social History   Socioeconomic History   Marital status: Married    Spouse name: Not on file   Number of children: Not on file   Years of education: Not on file   Highest education level: Not on file  Occupational History   Not on file  Tobacco Use   Smoking status: Never   Smokeless tobacco: Never  Vaping Use   Vaping Use: Never used  Substance and Sexual Activity   Alcohol use: No   Drug use: No   Sexual activity: Not on file  Other Topics Concern   Not on file  Social History Narrative   Pt lives in [place] alone.  Works at [employer]   Social Determinants of Corporate investment banker Strain: Not on [employer] Insecurity: Not on file  Transportation Needs: Not on file  Physical Activity: Not on file  Stress: Not on file  Social Connections: Not on file  Intimate Partner Violence: Not on file    Family History  Problem Relation Age of Onset   Coronary artery disease Father     ROS- All systems are reviewed and negative except as per the HPI above  Physical Exam: Vitals:   07/23/22 1437  Height:  (1.753 m)   GEN- The patient is well appearing, alert and oriented x 3 today.   Head- normocephalic, atraumatic Eyes-  Sclera clear, conjunctiva pink Ears- hearing intact OropharBaldemar Lenisnx-  clear Neck- supple, no JVP Lymph- no cervical lymphadenopathy Lungs- Clear to ausculation  bilaterally, normal work of breathing Heart- Regular rate and rhythm, no murmurs, rubs or gallops, PMI not laterally displaced GI- soft, NT, ND, + BS Extremities- no clubbing, cyanosis, or edema MS- no significant deformity or atrophy Skin- no rash or lesion Psych- euthymic mood, full affect Neuro- strength and sensation are intact  Echo 01/25/21:  1. Left ventricular ejection fraction, by estimation, is 55 to 60%. The  left ventricle has normal function. The left ventricle has no regional  wall motion abnormalities. There is mild left ventricular hypertrophy.  Left ventricular diastolic parameters  are indeterminate.   2. Right ventricular systolic function is normal. The right ventricular  size is normal. Tricuspid regurgitation signal is inadequate for assessing  PA pressure.   3. Left atrial size was mildly dilated.   4. The mitral valve is normal in structure. No evidence of mitral valve  regurgitation. No evidence of mitral stenosis.   5. The aortic valve is tricuspid. Aortic valve regurgitation is trivial.  No aortic stenosis is present.   6. The inferior vena cava is normal in size with greater than 50%  respiratory variability, suggesting right atrial pressure of 3 mmHg.   7. The patient was in atrial fibrillation.    EKG-  Vent. rate 94 BPM PR interval 198 ms QRS duration 82 ms QT/QTcB 372/465 ms P-R-T axes 84 11 53 Normal sinus rhythm Normal ECG When compared with ECG of 25-Apr-2021 14:19, PREVIOUS ECG IS PRESENT  Assessment and Plan: 1. Persistent  AFib In SR today  Off Tikosyn for months due to CKD and qt issues  She does not see Nephrology at this time although I did recommend this would be reasonable given ongoing CKD.    Continue metoprolol tartrate 50 mg bid - refill sent today  2. Secondary hypercoagulable state due to atrial fibrillation H/o upper GI bleed Resolved  Avoid antiinflammatories Continue pradaxa 150 mg bid, on appropriate dose based on  kidney function/ crcl calculated at 40 based on 05/21/22 Bmet.    3. HTN Elevated today - she has not been taking any hypertension medication for months.  Restart metoprolol BID. She will call next 1-2 days with home BP readings so we can titrate amlodipine slowly and not restart both at the same time.   4. TIA/aneurysm S/P embolization 05/2021.    She will call next 1-2 days to adjust HTN medications. F/u Afib clinic 6 months.   [place] Bells, PA-C Afib Clinic [facility] [address] [place], [place] [address] [phone]

## 2023-01-23 ENCOUNTER — Ambulatory Visit (HOSPITAL_COMMUNITY): Payer: PPO | Admitting: Internal Medicine

## 2023-01-25 DIAGNOSIS — Z8679 Personal history of other diseases of the circulatory system: Secondary | ICD-10-CM | POA: Insufficient documentation

## 2023-01-25 DIAGNOSIS — M79605 Pain in left leg: Secondary | ICD-10-CM | POA: Diagnosis not present

## 2023-01-25 DIAGNOSIS — R799 Abnormal finding of blood chemistry, unspecified: Secondary | ICD-10-CM | POA: Insufficient documentation

## 2023-01-25 DIAGNOSIS — R4781 Slurred speech: Secondary | ICD-10-CM | POA: Diagnosis not present

## 2023-01-25 DIAGNOSIS — K0889 Other specified disorders of teeth and supporting structures: Secondary | ICD-10-CM | POA: Insufficient documentation

## 2023-01-25 DIAGNOSIS — M79604 Pain in right leg: Secondary | ICD-10-CM | POA: Insufficient documentation

## 2023-01-25 DIAGNOSIS — R410 Disorientation, unspecified: Secondary | ICD-10-CM | POA: Diagnosis not present

## 2023-01-26 ENCOUNTER — Emergency Department (HOSPITAL_BASED_OUTPATIENT_CLINIC_OR_DEPARTMENT_OTHER): Payer: PPO

## 2023-01-26 ENCOUNTER — Encounter (HOSPITAL_BASED_OUTPATIENT_CLINIC_OR_DEPARTMENT_OTHER): Payer: Self-pay

## 2023-01-26 ENCOUNTER — Emergency Department (HOSPITAL_BASED_OUTPATIENT_CLINIC_OR_DEPARTMENT_OTHER)
Admission: EM | Admit: 2023-01-26 | Discharge: 2023-01-26 | Disposition: A | Discharge status: Home / Self Care | Payer: PPO | Attending: Emergency Medicine | Admitting: Emergency Medicine

## 2023-01-26 ENCOUNTER — Other Ambulatory Visit: Payer: Self-pay

## 2023-01-26 DIAGNOSIS — M79604 Pain in right leg: Secondary | ICD-10-CM

## 2023-01-26 DIAGNOSIS — Z8679 Personal history of other diseases of the circulatory system: Secondary | ICD-10-CM

## 2023-01-26 DIAGNOSIS — K0889 Other specified disorders of teeth and supporting structures: Secondary | ICD-10-CM

## 2023-01-26 LAB — CBC WITH DIFFERENTIAL/PLATELET
Abs Immature Granulocytes: 0.06 10*3/uL (ref 0.00–0.07)
Basophils Absolute: 0 10*3/uL (ref 0.0–0.1)
Basophils Relative: 0 %
Eosinophils Absolute: 0 10*3/uL (ref 0.0–0.5)
Eosinophils Relative: 0 %
HCT: 41.9 % (ref 36.0–46.0)
Hemoglobin: 14.2 g/dL (ref 12.0–15.0)
Immature Granulocytes: 0 %
Lymphocytes Relative: 14 %
Lymphs Abs: 1.9 10*3/uL (ref 0.7–4.0)
MCH: 29.5 pg (ref 26.0–34.0)
MCHC: 33.9 g/dL (ref 30.0–36.0)
MCV: 86.9 fL (ref 80.0–100.0)
Monocytes Absolute: 1.3 10*3/uL — ABNORMAL HIGH (ref 0.1–1.0)
Monocytes Relative: 9 %
Neutro Abs: 10.3 10*3/uL — ABNORMAL HIGH (ref 1.7–7.7)
Neutrophils Relative %: 77 %
Platelets: 249 10*3/uL (ref 150–400)
RBC: 4.82 MIL/uL (ref 3.87–5.11)
RDW: 13.4 % (ref 11.5–15.5)
WBC: 13.6 10*3/uL — ABNORMAL HIGH (ref 4.0–10.5)
nRBC: 0 % (ref 0.0–0.2)

## 2023-01-26 LAB — CBG MONITORING, ED: Glucose-Capillary: 121 mg/dL — ABNORMAL HIGH (ref 70–99)

## 2023-01-26 LAB — BASIC METABOLIC PANEL
Anion gap: 12 (ref 5–15)
BUN: 24 mg/dL — ABNORMAL HIGH (ref 8–23)
CO2: 23 mmol/L (ref 22–32)
Calcium: 8.8 mg/dL — ABNORMAL LOW (ref 8.9–10.3)
Chloride: 99 mmol/L (ref 98–111)
Creatinine, Ser: 1.53 mg/dL — ABNORMAL HIGH (ref 0.44–1.00)
GFR, Estimated: 35 mL/min — ABNORMAL LOW (ref >60)
Glucose, Bld: 113 mg/dL — ABNORMAL HIGH (ref 70–99)
Potassium: 4.1 mmol/L (ref 3.5–5.1)
Sodium: 134 mmol/L — ABNORMAL LOW (ref 135–145)

## 2023-01-26 MED ORDER — IOHEXOL 350 MG/ML SOLN
75.0000 mL | Freq: Once | INTRAVENOUS | Status: AC | PRN
  Administered 2023-01-26: 75 mL via INTRAVENOUS

## 2023-01-26 MED ORDER — AMOXICILLIN-POT CLAVULANATE 875-125 MG PO TABS
1.0000 | ORAL_TABLET | Freq: Once | ORAL | Status: AC
  Administered 2023-01-26: 1 via ORAL
  Filled 2023-01-26: qty 1

## 2023-01-26 NOTE — ED Notes (Signed)
Pt able to ambulate per EDP request

## 2023-01-26 NOTE — ED Notes (Signed)
Pt declined wheelchair for discharge.

## 2023-01-26 NOTE — ED Triage Notes (Signed)
Pt reports that about 5-6 days ago she has been feeling weak with pain in BLE. She also states she was driving and thinks she had a "brain aneurysm" or blood clot go into the LT side of her head. She acutely had a yellow ring in her field of vision on LT eye and also became confused. Pt has hx of brain aneurysm per family at bedside and last saw Neurology 3 yrs ago. Pt reports she still has a headache on top of her head. Son says he noticed slurred speech 2 days ago, unsure if r/t to a tooth abscess in mouth.

## 2023-01-26 NOTE — ED Provider Notes (Signed)
[facility]  Provider Note  CSN: [id number] Arrival date & time: 01/25/23 2343  History Chief Complaint  Patient presents with   Weakness   Headache    Rose Ball is a 77 y.o. female with history of afib on Pradaxa, prior cerebral aneurysm identified in 2022 during a TIA workup s/p coiling in Mar 2023 brought by son for evaluation of 3 days of bilateral leg pain, worse with walking and feeling off balance. Son reports some slurred speech, but she attributes that to dry mouth and a dental infection she was seen for yesterday. She mentions feeling confused and having a ring in her field of vision and is concerned about a recurrence of her aneurysm.    Home Medications Prior to Admission medications   Medication Sig Start Date End Date Taking? Authorizing Provider  amLODipine (NORVASC) 5 MG tablet Take 1 tablet (5 mg total) by mouth daily. 07/23/22   Eustace Pen, PA-C  amphetamine-dextroamphetamine (ADDERALL) 15 MG tablet Take 15 mg by mouth daily as needed (for additional focus).  04/02/11   [provider]  atorvastatin (LIPITOR) 10 MG tablet TAKE 1 TABLET BY MOUTH EVERY DAY 09/26/21   Newman Nip, NP  busPIRone (BUSPAR) 7.5 MG tablet Take 7.5 mg by mouth 2 (two) times daily. 07/11/22   [provider]  dabigatran (PRADAXA) 150 MG CAPS capsule Take 1 capsule (150 mg total) by mouth 2 (two) times daily. 07/23/22   Eustace Pen, PA-C  escitalopram (LEXAPRO) 20 MG tablet Take 20 mg by mouth Daily. Lexapro 07/07/10   [provider]  Melatonin 10 MG TABS Take 10 mg by mouth at bedtime.    [provider]  metoprolol tartrate (LOPRESSOR) 50 MG tablet Take 1 tablet (50 mg total) by mouth 2 (two) times daily. 07/23/22   Eustace Pen, PA-C  Multiple Vitamins-Minerals (MULTIVITAMIN WITH MINERALS) tablet Take 1 tablet by mouth daily.    [provider]  rOPINIRole (REQUIP) 1 MG tablet  Take 1 mg by mouth at bedtime.    [provider]  VYVANSE 70 MG capsule Take 70 mg by mouth Daily.  07/11/10   [provider]     Allergies    Amiodarone   Review of Systems   Review of Systems Please see HPI for pertinent positives and negatives  Physical Exam BP (!) 177/90   Pulse 94   Temp 99.3 F (37.4 C) (Oral)   Resp (!) 21   Ht 5\' 9"  (1.753 m)   Wt 91.1 kg   SpO2 95%   BMI 29.66 kg/m   Physical Exam Vitals and nursing note reviewed.  Constitutional:      Appearance: Normal appearance.  HENT:     Head: Normocephalic and atraumatic.     Nose: Nose normal.     Mouth/Throat:     Mouth: Mucous membranes are moist.     Comments: Swelling of gums adjacent to carious left lower molar Eyes:     Extraocular Movements: Extraocular movements intact.     Conjunctiva/sclera: Conjunctivae normal.  Cardiovascular:     Rate and Rhythm: Normal rate.  Pulmonary:     Effort: Pulmonary effort is normal.     Breath sounds: Normal breath sounds.  Abdominal:     General: Abdomen is flat.     Palpations: Abdomen is soft.     Tenderness: There is no abdominal tenderness.  Musculoskeletal:  General: No swelling, tenderness or signs of injury. Normal range of motion.     Cervical back: Neck supple.     Right lower leg: No edema.     Left lower leg: No edema.  Skin:    General: Skin is warm and dry.  Neurological:     General: No focal deficit present.     Mental Status: She is alert and oriented to person, place, and time.     Cranial Nerves: No cranial nerve deficit.     Sensory: No sensory deficit.     Motor: No weakness.     Coordination: Coordination normal.  Psychiatric:        Mood and Affect: Mood normal.     ED Results / Procedures / Treatments   EKG EKG Interpretation Date/Time:  Saturday January 26 2023 00:16:05 EDT Ventricular Rate:  103 PR Interval:    QRS Duration:  87 QT Interval:  344 QTC Calculation: 451 R  Axis:   11  Text Interpretation: Atrial fibrillation Abnormal R-wave progression, early transition Consider left ventricular hypertrophy Since last tracing Atrial fibrillation has replaced Normal sinus rhythm Confirmed by Susy Frizzle [phone]) on 01/26/2023 12:35:59 AM  Procedures Procedures  Medications Ordered in the ED Medications  amoxicillin-clavulanate (AUGMENTIN) 875-125 MG per tablet 1 tablet (1 tablet Oral Given 01/26/23 0204)  iohexol (OMNIPAQUE) 350 MG/ML injection 75 mL (75 mLs Intravenous Contrast Given 01/26/23 0226)    Initial Impression and Plan  Patient here with an unusual constellation of symptoms that she is concerned are related to her prior aneurysm. She has a normal neuro exam now and symptoms have been ongoing for 3 days, not a Code Stroke candidate. She has had CKD in the past as well, will check dry CT and basic labs.  She reports she was given rx for Abx from dentist yesterday but didn't get it filled yet.   ED Course   Clinical Course as of 01/26/23 0326  Sat Jan 26, 2023  0043 CBC with mild leukocytosis, otherwise unremarkable.  [CS]  0110 BMP with CKD similar to prior.  [CS]  0155 I personally viewed the images from radiology studies and agree with radiologist interpretation: CT is negative for acute bleed, will send for CTA. Augmentin for toothache.  [CS]  6045 I personally viewed the images from radiology studies and agree with radiologist interpretation: CTA is neg for new aneurysm or bleeding. She has some residual filling of previously treated aneurysm but does not appear to be contributing to her symptoms tonight. She is able to stand and walk with a steady gait. No ataxia. Remainder of neuro exam remains normal. She is in afib, but rate relatively well controlled. She has DOAC and beta blockers as part of her medication regimen. She does not have any objective neuro findings to suggest a TIA or stroke. Recommend she follow up with her PCP for further  evaluation of her bilateral leg pain. Continue medications at home including Abx prescribed by dentist yesterday. RTED for any other concerns.  [CS]    Clinical Course User Index [CS] Pollyann Savoy, MD     MDM Rules/Calculators/A&P Medical Decision Making Given presenting complaint, I considered that admission might be necessary. After review of results from ED lab and/or imaging studies, admission to the hospital is not indicated at this time.    Problems Addressed: Bilateral leg pain: acute illness or injury History of cerebral aneurysm: chronic illness or injury Toothache: acute illness or injury  Amount and/or  Complexity of Data Reviewed Labs: ordered. Decision-making details documented in ED Course. Radiology: ordered and independent interpretation performed. Decision-making details documented in ED Course. ECG/medicine tests: ordered and independent interpretation performed. Decision-making details documented in ED Course.  Risk Prescription drug management. Decision regarding hospitalization.     Final Clinical Impression(s) / ED Diagnoses Final diagnoses:  Bilateral leg pain  Toothache  History of cerebral aneurysm    Rx / DC Orders ED Discharge Orders     None        Pollyann Savoy, MD 01/26/23 [phone]

## 2023-02-05 ENCOUNTER — Ambulatory Visit (HOSPITAL_COMMUNITY)
Admission: RE | Admit: 2023-02-05 | Discharge: 2023-02-05 | Disposition: A | Discharge status: Home / Self Care | Payer: PPO | Source: Ambulatory Visit | Attending: Internal Medicine | Admitting: Internal Medicine

## 2023-02-05 VITALS — BP 164/82 | HR 60 | Ht 69.0 in | Wt 202.8 lb

## 2023-02-05 DIAGNOSIS — I129 Hypertensive chronic kidney disease with stage 1 through stage 4 chronic kidney disease, or unspecified chronic kidney disease: Secondary | ICD-10-CM | POA: Diagnosis not present

## 2023-02-05 DIAGNOSIS — I4819 Other persistent atrial fibrillation: Secondary | ICD-10-CM

## 2023-02-05 DIAGNOSIS — Z8673 Personal history of transient ischemic attack (TIA), and cerebral infarction without residual deficits: Secondary | ICD-10-CM | POA: Diagnosis not present

## 2023-02-05 DIAGNOSIS — Z79899 Other long term (current) drug therapy: Secondary | ICD-10-CM | POA: Insufficient documentation

## 2023-02-05 DIAGNOSIS — I4892 Unspecified atrial flutter: Secondary | ICD-10-CM | POA: Diagnosis not present

## 2023-02-05 DIAGNOSIS — Z7901 Long term (current) use of anticoagulants: Secondary | ICD-10-CM | POA: Insufficient documentation

## 2023-02-05 DIAGNOSIS — D6869 Other thrombophilia: Secondary | ICD-10-CM | POA: Insufficient documentation

## 2023-02-05 DIAGNOSIS — N189 Chronic kidney disease, unspecified: Secondary | ICD-10-CM | POA: Diagnosis not present

## 2023-02-05 MED ORDER — AMLODIPINE BESYLATE 5 MG PO TABS: 5.0000 mg | ORAL_TABLET | Freq: Every day | ORAL | 2 refills | Status: DC

## 2023-02-05 NOTE — Progress Notes (Signed)
Patient ID: Rose Ball, female   DOB: 05/13/1945, 77 y.o.   MRN: [id number]     Primary Care Physician: Patient, No Pcp Per Referring Physician: Dr. Johney Frame Neurology: Dr. Janina Mayo Rose Ball is a 77 y.o. female with a h/o afib, s/p convergent procedure 2013, on tikosyn. She is in the clinic for f/u. She had   GI bleed, last October, was taking naprosyn with pradaxa for low back pain. Had presenting HGB of 6.9, received 3 units of blood. Was found to have a gastric ulcer, endoscopy and colonoscopy was done in the hospital. She was seen f/u by GI but has not had any further f/u of her blood count as she still does not have a PCP.  This in the past has been  stressed to the pt importance of getting established with one. She states that she is working on it.   She denies  feeling   weakness or shortness of breath with exertion as she did with the anemia.. She has remained in SR during this time. No further bleeding issues.   F/u in afib clinic, 05/19/19. Pt is here for Tikosyn surveillance. Reports no afib. Feels well. Still has not established with a PCP.   F/u in afib clinic, 12/24/19. Does not report any  afib. Compliant with Tikosyn and Pradaxa with a CHA2DS2VASc score of 4. Fasting blood work to f/u on refill of Lipitor as she has not established with a PCP. BP is elevated today. States she took BB just minutes ago in her car. No concerns today.   F/u in afib clinic, 08/03/20. No afib to report. On last visit, her Tikosyn dose was reduced for decline in kidney function.SHe was referred to nephrology and I think her function stabilized. Will check again today. In conversation, pt states that she has been opening the capsule of 500 mcg  tikosyn and spitting the capsules into 2 doses. Informed pt that this is dangerous and this is not recommended. She states she did not want to waste money . I will re send the 250 mcg capsule and repeat EKG when back on correct dose in 2-3 days.Ekg shows  atrial flutter today at 97 bpm.    F/u in afib clinic, 04/25/21 for afib.I see in Epic  where she was sent to the ER 01/25/21 when she was talking to her therapist and could not get her words out.  MRI was done, dx with TIA and an aneurysm was found . She was discharged to f/u with Neurology and interventional radiologist. She did not go to the radiologist. When she saw Neurology, he urged her to make another appointment. I can see where his office tried twice to call her to schedule. I asked her today if she did , she replied she didn't and did not seem overly enthused to schedule the appointment. I urged her to do this again today based on the concerns of Dr. Teresa Coombs.. I had also referred her to nephrology in the past  for her CKD and she no longer sees them.  She has no PCP to monitor her BP/kidney function/other primary issues. She has been asked numerous times to establish with a PCP. She is in SR today but states that she will be often in afib. Tikosyn had to be stopped for elevated creatinine and prolonged  qt interval. She remains on pradaxa, renal function calculated to crcl/min and she remains on the appropriate dose. BP is very elevated today at 180/86. She  does not monitor  at home.   F/u in afib clinic, 07/23/22. She is currently in SR. She cannot recall last episode of Afib. She is doing well overall but admits to not having taken either amlodipine or metoprolol for months. Her BP is very elevated today. She feels drained and a little lightheaded but denies blurry vision, headache, chest pain. She has been compliant with Pradaxa 150 mg BID. No bleeding concerns. 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   F/u in Afib clinic, 02/05/23. She is currently in atrial flutter. She went to ED on 10/26 for leg pain, feeling confused, ring in field of vision, and was concerned about recurrence of her cerebral aneurysm. She was found to be in atrial flutter. CTA imaging was  reassuring. No bleeding issues on Pradaxa 150 mg BID. She feels tired when out of rhythm.   Today, she denies symptoms of palpitations, chest pain,  orthopnea, PND, lower extremity edema, dizziness, presyncope, syncope, or neurologic sequela.  The patient is tolerating medications without difficulties and is otherwise without complaint today.   Past Medical History:  Diagnosis Date   ADHD (attention deficit hyperactivity disorder)    Anxiety    Arthritis    Attention deficit    on Vyvanse   Chronic anticoagulation    Depression    Dyslipidemia    GI bleed 2022   Hypertension    Hypokalemia    Morbid obesity (HCC)    Palpitations    Persistent atrial fibrillation (HCC) 05/27/14 Chadsvasc score of 4   s/p cardioversion Sept 2012; failed   Pneumonia    Stroke [facility])    TIA (transient ischemic attack)    12/2020   Past Surgical History:  Procedure Laterality Date   BIOPSY  01/02/2018   Procedure: BIOPSY;  Surgeon: Napoleon Form, MD;  Location: [facility] ENDOSCOPY;  Service: Endoscopy;;   BREAST BIOPSY     CARDIOVERSION  Sept 2012   failed   CARDIOVERSION N/A 09/08/2012   Procedure: CARDIOVERSION;  Surgeon: Pricilla Riffle, MD;  Location: [facility] ENDOSCOPY;  Service: Cardiovascular;  Laterality: N/A;   CARDIOVERSION N/A 02/15/2013   Procedure: CARDIOVERSION-BEDSIDE;  Surgeon: Cassell Clement, MD;  Location[facility] OR;  Service: Cardiovascular;  Laterality: N/A;   CARDIOVERSION N/A 02/05/2011   Procedure: CARDIOVERSION;  Surgeon: Marinus Maw, MD;  Location: [facility] C[facility] LAB;  Service: Cardiovascular;  Laterality: N/A;   COLONOSCOPY N/A 01/03/2018   Procedure: COLONOSCOPY;  Surgeon: Napoleon Form, MD;  Location: [facility] ENDOSCOPY;  Service: Endoscopy;  Laterality: N/A;   Convergent procedure  11/13/11   afib ablation at [facility]   EP IMPLANTABLE DEVICE N/A 02/09/2015   Procedure: Loop Recorder Removal;  Surgeon: Hillis Range, MD;  Location: [facility] INVASIVE CV LAB;  Service: Cardiovascular;  Laterality: N/A;    ESOPHAGOGASTRODUODENOSCOPY (EGD) WITH PROPOFOL N/A 01/02/2018   Procedure: ESOPHAGOGASTRODUODENOSCOPY (EGD) WITH PROPOFOL;  Surgeon: Napoleon Form, MD;  Location: [facility] ENDOSCOPY;  Service: Endoscopy;  Laterality: N/A;   IR 3D INDEPENDENT WKST  06/21/2021   IR ANGIO INTRA EXTRACRAN SEL COM CAROTID INNOMINATE UNI L MOD SED  06/21/2021   IR ANGIO INTRA EXTRACRAN SEL INTERNAL CAROTID UNI R MOD SED  06/21/2021   IR ANGIO VERTEBRAL SEL SUBCLAVIAN INNOMINATE BILAT MOD SED  06/21/2021   IR ANGIOGRAM FOLLOW UP STUDY  06/21/2021   IR CT HEAD LTD  06/21/2021   IR NEURO EACH ADD'L AFTER BASIC UNI RIGHT (MS)  06/21/2021   IR TRANSCATH/EMBOLIZ  06/21/2021   IR US GUIDE VASC ACCESS RIGHT  06/21/2021   loop recorder insertion Left 09/2011   Loop recorder removal  02/09/15   MDT Reveal XT loop recorder removed by Dr Johney Frame   POLYPECTOMY  01/03/2018   Procedure: POLYPECTOMY;  Surgeon: Napoleon Form, MD;  Location: [facility] ENDOSCOPY;  Service: Endoscopy;;   RADIOLOGY WITH ANESTHESIA N/A 06/21/2021   Procedure: EMBOLIZATION;  Surgeon: Baldemar Lenis, MD;  Location: [facility] OR;  Service: Radiology;  Laterality: N/A;   TEE WITHOUT CARDIOVERSION N/A 09/08/2012   Procedure: TRANSESOPHAGEAL ECHOCARDIOGRAM (TEE);  Surgeon: Pricilla Riffle, MD;  Location: [facility] ENDOSCOPY;  Service: Cardiovascular;  Laterality: N/A;   TONSILLECTOMY     TONSILLECTOMY     US ECHOCARDIOGRAPHY  Aug 2012   Normal systolic function with EF 55% and slight LAE at 4.4cm    Current Outpatient Medications  Medication Sig Dispense Refill   amphetamine-dextroamphetamine (ADDERALL) 15 MG tablet Take 15 mg by mouth daily as needed (for additional focus).      busPIRone (BUSPAR) 7.5 MG tablet Take 7.5 mg by mouth 2 (two) times daily.     dabigatran (PRADAXA) 150 MG CAPS capsule Take 1 capsule (150 mg total) by mouth 2 (two) times daily. 180 capsule 2   escitalopram (LEXAPRO) 20 MG tablet Take 20 mg by mouth Daily. Lexapro     Melatonin 10 MG TABS Take  10 mg by mouth at bedtime.     metoprolol tartrate (LOPRESSOR) 50 MG tablet Take 1 tablet (50 mg total) by mouth 2 (two) times daily. 180 tablet 2   Multiple Vitamins-Minerals (MULTIVITAMIN WITH MINERALS) tablet Take 1 tablet by mouth daily.     rOPINIRole (REQUIP) 1 MG tablet Take 1 mg by mouth at bedtime.     VYVANSE 70 MG capsule Take 70 mg by mouth Daily.      amLODipine (NORVASC) 5 MG tablet Take 1 tablet (5 mg total) by mouth daily. 90 tablet 2   atorvastatin (LIPITOR) 10 MG tablet TAKE 1 TABLET BY MOUTH EVERY DAY (Patient not taking: Reported on 02/05/2023) 90 tablet 2   No current facility-administered medications for this encounter.    Allergies  Allergen Reactions   Amiodarone Other (See Comments)    REACTION: dyspnea    ROS- All systems are reviewed and negative except as per the HPI above  Physical Exam: Vitals:   02/05/23 1413  BP: (!) 164/82  Pulse: 60  Weight: 92 kg  Height: 5\' 9"  (1.753 m)    GEN- The patient is well appearing, alert and oriented x 3 today.   Neck - no JVD or carotid bruit noted Lungs- Clear to ausculation bilaterally, normal work of breathing Heart- Irregular rate and rhythm, no murmurs, rubs or gallops, PMI not laterally displaced Extremities- no clubbing, cyanosis, or edema Skin - no rash or ecchymosis noted   Echo 01/25/21:  1. Left ventricular ejection fraction, by estimation, is 55 to 60%. The  left ventricle has normal function. The left ventricle has no regional  wall motion abnormalities. There is mild left ventricular hypertrophy.  Left ventricular diastolic parameters  are indeterminate.   2. Right ventricular systolic function is normal. The right ventricular  size is normal. Tricuspid regurgitation signal is inadequate for assessing  PA pressure.   3. Left atrial size was mildly dilated.   4. The mitral valve is normal in structure. No evidence of mitral valve  regurgitation. No evidence of mitral stenosis.   5. The aortic  valve is tricuspid. Aortic valve regurgitation is trivial.  No aortic stenosis is present.   6. The inferior vena cava is normal in size with greater than 50%  respiratory variability, suggesting right atrial pressure of 3 mmHg.   7. The patient was in atrial fibrillation.    EKG-  Vent. rate 60 BPM PR interval * ms QRS duration 86 ms QT/QTcB 432/432 ms P-R-T axes 116 3 57 Atrial flutter with variable A-V block Septal infarct , age undetermined Abnormal ECG When compared with ECG of 26-Jan-2023 00:16, PREVIOUS ECG IS PRESENT  Assessment and Plan: 1. Persistent  AFib She is in atrial flutter.  Off Tikosyn for months due to CKD and qt issues.  After discussion, patient would like to proceed with cardioversion to attempt to convert to normal rhythm. Labs done in ED on 10/26.   Informed Consent   Shared Decision Making/Informed Consent The risks (stroke, cardiac arrhythmias rarely resulting in the need for a temporary or permanent pacemaker, skin irritation or burns and complications associated with conscious sedation including aspiration, arrhythmia, respiratory failure and death), benefits (restoration of normal sinus rhythm) and alternatives of a direct current cardioversion were explained in detail to Ms. Albert and she agrees to proceed.      2. Secondary hypercoagulable state due to atrial fibrillation H/o upper GI bleed Resolved  Avoid antiinflammatories Continue pradaxa 150 mg bid, on appropriate dose based on kidney function/ crcl calculated at 40 based on 05/21/22 Bmet.    3. HTN Elevated today - she has not been taking any hypertension medication for months. Refill amlodipine 5 mg daily and recommended patient to monitor BP at home.   4. TIA/aneurysm S/P embolization 05/2021.    F/u 2 weeks after DCCV.   Lake Bells, PA-C [facility] [address] [place], [place] [address] [phone]

## 2023-02-05 NOTE — Patient Instructions (Addendum)
Restart amlodipine for blood pressure   Cardioversion scheduled for: Friday, November 8th   - Arrive at the [facility] and go to admitting at [facility] not eat or drink anything after midnight the night prior to your procedure.   - Take all your morning medication (except diabetic medications) with a sip of water prior to arrival.  - You will not be able to drive home after your procedure.    - Do NOT miss any doses of your blood thinner - if you should miss a dose please notify our office immediately.   - If you feel as if you go back into normal rhythm prior to scheduled cardioversion, please notify our office immediately.   If your procedure is canceled in the cardioversion suite you will be charged a cancellation fee.

## 2023-02-06 ENCOUNTER — Telehealth (HOSPITAL_COMMUNITY): Payer: Self-pay | Admitting: *Deleted

## 2023-02-06 NOTE — Telephone Encounter (Signed)
Patient would like to cancel cardioversion and follow her rhythm with kardia as she feels she is going in/out of rhythm. DCCV canceled. She will keep follow up to reassess burden after following with kardia. Pt in agreement.

## 2023-02-08 ENCOUNTER — Encounter (HOSPITAL_COMMUNITY): Admission: RE | Payer: Self-pay | Source: Home / Self Care

## 2023-02-08 ENCOUNTER — Ambulatory Visit (HOSPITAL_COMMUNITY): Admission: RE | Admit: 2023-02-08 | Payer: PPO | Source: Home / Self Care | Admitting: Cardiology

## 2023-02-08 SURGERY — CARDIOVERSION (CATH LAB)
Anesthesia: General

## 2023-02-25 ENCOUNTER — Ambulatory Visit (HOSPITAL_COMMUNITY): Payer: PPO | Admitting: Internal Medicine

## 2023-04-08 ENCOUNTER — Other Ambulatory Visit (HOSPITAL_COMMUNITY): Payer: Self-pay | Admitting: Internal Medicine

## 2023-07-25 ENCOUNTER — Other Ambulatory Visit (HOSPITAL_COMMUNITY): Payer: Self-pay | Admitting: Internal Medicine

## 2024-01-01 ENCOUNTER — Other Ambulatory Visit (HOSPITAL_COMMUNITY): Payer: Self-pay | Admitting: Internal Medicine

## 2024-01-08 ENCOUNTER — Encounter (HOSPITAL_COMMUNITY): Payer: Self-pay | Admitting: Internal Medicine

## 2024-01-08 ENCOUNTER — Ambulatory Visit (HOSPITAL_COMMUNITY): Admitting: Internal Medicine

## 2024-01-08 ENCOUNTER — Ambulatory Visit (HOSPITAL_COMMUNITY)
Admission: RE | Admit: 2024-01-08 | Discharge: 2024-01-08 | Disposition: A | Discharge status: Home / Self Care | Source: Ambulatory Visit | Attending: Internal Medicine | Admitting: Internal Medicine

## 2024-01-08 VITALS — BP 128/88 | HR 87 | Ht 69.0 in | Wt 193.4 lb

## 2024-01-08 DIAGNOSIS — D6869 Other thrombophilia: Secondary | ICD-10-CM

## 2024-01-08 DIAGNOSIS — I4819 Other persistent atrial fibrillation: Secondary | ICD-10-CM | POA: Diagnosis not present

## 2024-01-08 DIAGNOSIS — I4892 Unspecified atrial flutter: Secondary | ICD-10-CM

## 2024-01-08 MED ORDER — METOPROLOL TARTRATE 50 MG PO TABS: 50.0000 mg | ORAL_TABLET | Freq: Two times a day (BID) | ORAL | 3 refills | Status: AC

## 2024-01-08 MED ORDER — DABIGATRAN ETEXILATE MESYLATE 150 MG PO CAPS: 150.0000 mg | ORAL_CAPSULE | Freq: Two times a day (BID) | ORAL | 3 refills | Status: AC

## 2024-01-08 NOTE — Progress Notes (Incomplete)
 Patient ID: Rose Ball, female   DOB: 11/02/1945, 78 y.o.   MRN: [id number]     Primary Care Physician: Patient, No Pcp Per Referring Physician: Dr. Kelsie (inactive) Neurology: Dr. Gregg Richerd ELISABEL Ball is a 78 y.o. female with a h/o afib, s/p convergent procedure 2013, on tikosyn . She is in the clinic for f/u. She had  GI bleed, last October, was taking naprosyn  with pradaxa  for low back pain. Had presenting HGB of 6.9, received 3 units of blood. Was found to have a gastric ulcer, endoscopy and colonoscopy was done in the hospital. She was seen f/u by GI but has not had any further f/u of her blood count as she still does not have a PCP.  This in the past has been  stressed to the pt importance of getting established with one. She states that she is working on it.   She denies  feeling   weakness or shortness of breath with exertion as she did with the anemia.. She has remained in SR during this time. No further bleeding issues.   F/u in afib clinic, 05/19/19. Pt is here for Tikosyn  surveillance. Reports no afib. Feels well. Still has not established with a PCP.   F/u in afib clinic, 12/24/19. Does not report any afib. Compliant with Tikosyn  and Pradaxa  with a CHA2DS2VASc score of 4. Fasting blood work to f/u on refill of Lipitor as she has not established with a PCP. BP is elevated today. States she took BB just minutes ago in her car. No concerns today.   F/u in afib clinic, 08/03/20. No afib to report. On last visit, her Tikosyn  dose was reduced for decline in kidney function. She was referred to nephrology and I think her function stabilized. Will check again today. In conversation, pt states that she has been opening the capsule of 500 mcg  tikosyn  and spitting the capsules into 2 doses. Informed pt that this is dangerous and this is not recommended. She states she did not want to waste money . I will re send the 250 mcg capsule and repeat EKG when back on correct dose in 2-3  days.Ekg shows atrial flutter today at 97 bpm.    F/u in afib clinic, 04/25/21 for afib.I see in Epic  where she was sent to the ER 01/25/21 when she was talking to her therapist and could not get her words out.  MRI was done, dx with TIA and an aneurysm was found . She was discharged to f/u with Neurology and interventional radiologist. She did not go to the radiologist. When she saw Neurology, he urged her to make another appointment. I can see where his office tried twice to call her to schedule. I asked her today if she did , she replied she didn't and did not seem overly enthused to schedule the appointment. I urged her to do this again today based on the concerns of Dr. Gregg.. I had also referred her to nephrology in the past  for her CKD and she no longer sees them.  She has no PCP to monitor her BP/kidney function/other primary issues. She has been asked numerous times to establish with a PCP. She is in SR today but states that she will be often in afib. Tikosyn  had to be stopped for elevated creatinine and prolonged  qt interval. She remains on pradaxa , renal function calculated to crcl/min and she remains on the appropriate dose. BP is very elevated today at 180/86. She  does not monitor  at home.   F/u in afib clinic, 07/23/22. She is currently in SR. She cannot recall last episode of Afib. She is doing well overall but admits to not having taken either amlodipine  or metoprolol  for months. Her BP is very elevated today. She feels drained and a little lightheaded but denies blurry vision, headache, chest pain. She has been compliant with Pradaxa  150 mg BID. No bleeding concerns. 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   F/u in Afib clinic, 02/05/23. She is currently in atrial flutter. She went to ED on 10/26 for leg pain, feeling confused, ring in field of vision, and was concerned about recurrence of her cerebral aneurysm. She was found to be in atrial flutter.  CTA imaging was reassuring. No bleeding issues on Pradaxa  150 mg BID. She feels tired when out of rhythm.   Follow up Afib clinic, 01/08/24. Patient is currently in ***. She was seen last November as noted above and subsequently canceled the cardioversion which was scheduled. No bleeding issues on Pradaxa  150 mg BID.   Today, she denies symptoms of palpitations, chest pain,  orthopnea, PND, lower extremity edema, dizziness, presyncope, syncope, or neurologic sequela.  The patient is tolerating medications without difficulties and is otherwise without complaint today.   Past Medical History:  Diagnosis Date   ADHD (attention deficit hyperactivity disorder)    Anxiety    Arthritis    Attention deficit    on Vyvanse    Chronic anticoagulation    Depression    Dyslipidemia    GI bleed 2022   Hypertension    Hypokalemia    Morbid obesity (HCC)    Palpitations    Persistent atrial fibrillation (HCC) 05/27/14 Chadsvasc score of 4   s/p cardioversion Sept 2012; failed   Pneumonia    Stroke [facility])    TIA (transient ischemic attack)    12/2020   Past Surgical History:  Procedure Laterality Date   BIOPSY  01/02/2018   Procedure: BIOPSY;  Surgeon: Rose Gustav GAILS, MD;  Location: [facility] ENDOSCOPY;  Service: Endoscopy;;   BREAST BIOPSY     CARDIOVERSION  Sept 2012   failed   CARDIOVERSION N/A 09/08/2012   Procedure: CARDIOVERSION;  Surgeon: Rose Rose Ball Gull, MD;  Location: [facility] ENDOSCOPY;  Service: Cardiovascular;  Laterality: N/A;   CARDIOVERSION N/A 02/15/2013   Procedure: CARDIOVERSION-BEDSIDE;  Surgeon: Rose Gallop, MD;  Location: [facility] OR;  Service: Cardiovascular;  Laterality: N/A;   CARDIOVERSION N/A 02/05/2011   Procedure: CARDIOVERSION;  Surgeon: Rose LELON Birmingham, MD;  Location: [facility] CATH LAB;  Service: Cardiovascular;  Laterality: N/A;   COLONOSCOPY N/A 01/03/2018   Procedure: COLONOSCOPY;  Surgeon: Rose Gustav GAILS, MD;  Location: [facility] ENDOSCOPY;  Service: Endoscopy;  Laterality: N/A;    Convergent procedure  11/13/11   afib ablation at [facility]   EP IMPLANTABLE DEVICE N/A 02/09/2015   Procedure: Loop Recorder Removal;  Surgeon: Lynwood Rakers, MD;  Location: [facility] INVASIVE CV LAB;  Service: Cardiovascular;  Laterality: N/A;   ESOPHAGOGASTRODUODENOSCOPY (EGD) WITH PROPOFOL  N/A 01/02/2018   Procedure: ESOPHAGOGASTRODUODENOSCOPY (EGD) WITH PROPOFOL ;  Surgeon: Rose Gustav GAILS, MD;  Location: [facility] ENDOSCOPY;  Service: Endoscopy;  Laterality: N/A;   IR 3D INDEPENDENT WKST  06/21/2021   IR ANGIO INTRA EXTRACRAN SEL COM CAROTID INNOMINATE UNI L MOD SED  06/21/2021   IR ANGIO INTRA EXTRACRAN SEL INTERNAL CAROTID UNI R MOD SED  06/21/2021   IR ANGIO VERTEBRAL SEL SUBCLAVIAN INNOMINATE BILAT MOD SED  06/21/2021   IR ANGIOGRAM FOLLOW UP STUDY  06/21/2021   IR CT HEAD LTD  06/21/2021   IR NEURO EACH ADD'L AFTER BASIC UNI RIGHT (MS)  06/21/2021   IR TRANSCATH/EMBOLIZ  06/21/2021   IR US  GUIDE VASC ACCESS RIGHT  06/21/2021   loop recorder insertion Left 09/2011   Loop recorder removal  02/09/15   MDT Reveal XT loop recorder removed by Dr Kelsie   POLYPECTOMY  01/03/2018   Procedure: POLYPECTOMY;  Surgeon: Rose Gustav GAILS, MD;  Location: [facility] ENDOSCOPY;  Service: Endoscopy;;   RADIOLOGY WITH ANESTHESIA N/A 06/21/2021   Procedure: EMBOLIZATION;  Surgeon: de Macedo Rodrigues, Katyucia, MD;  Location: [facility] OR;  Service: Radiology;  Laterality: N/A;   TEE WITHOUT CARDIOVERSION N/A 09/08/2012   Procedure: TRANSESOPHAGEAL ECHOCARDIOGRAM (TEE);  Surgeon: Rose Rose Ball Gull, MD;  Location: [facility] ENDOSCOPY;  Service: Cardiovascular;  Laterality: N/A;   TONSILLECTOMY     TONSILLECTOMY     US  ECHOCARDIOGRAPHY  Aug 2012   Normal systolic function with EF 55% and slight LAE at 4.4cm    Current Outpatient Medications  Medication Sig Dispense Refill   amLODipine  (NORVASC ) 5 MG tablet Take 1 tablet (5 mg total) by mouth daily. 90 tablet 2   amphetamine-dextroamphetamine (ADDERALL) 15 MG tablet Take 15 mg by mouth daily as needed  (for additional focus).      atorvastatin  (LIPITOR) 10 MG tablet TAKE 1 TABLET BY MOUTH EVERY DAY (Patient not taking: Reported on 02/05/2023) 90 tablet 2   busPIRone (BUSPAR) 7.5 MG tablet Take 7.5 mg by mouth 2 (two) times daily.     dabigatran  (PRADAXA ) 150 MG CAPS capsule TAKE 1 CAPSULE BY MOUTH TWICE A DAY 180 capsule 2   escitalopram  (LEXAPRO ) 20 MG tablet Take 20 mg by mouth Daily. Lexapro      Melatonin 10 MG TABS Take 10 mg by mouth at bedtime.     metoprolol  tartrate (LOPRESSOR ) 50 MG tablet Take 1 tablet (50 mg total) by mouth 2 (two) times daily. Needs appointment for refills 60 tablet 0   Multiple Vitamins-Minerals (MULTIVITAMIN WITH MINERALS) tablet Take 1 tablet by mouth daily.     rOPINIRole  (REQUIP ) 1 MG tablet Take 1 mg by mouth at bedtime.     VYVANSE  70 MG capsule Take 70 mg by mouth Daily.      No current facility-administered medications for this visit.    Allergies  Allergen Reactions   Amiodarone Other (See Comments)    REACTION: dyspnea    ROS- All systems are reviewed and negative except as per the HPI above  Physical Exam: There were no vitals filed for this visit.  GEN- The patient is well appearing, alert and oriented x 3 today.   Neck - no JVD or carotid bruit noted Lungs- Clear to ausculation bilaterally, normal work of breathing Heart- ***Regular rate and rhythm, no murmurs, rubs or gallops, PMI not laterally displaced Extremities- no clubbing, cyanosis, or edema Skin - no rash or ecchymosis noted   Echo 01/25/21:  1. Left ventricular ejection fraction, by estimation, is 55 to 60%. The  left ventricle has normal function. The left ventricle has no regional  wall motion abnormalities. There is mild left ventricular hypertrophy.  Left ventricular diastolic parameters  are indeterminate.   2. Right ventricular systolic function is normal. The right ventricular  size is normal. Tricuspid regurgitation signal is inadequate for assessing  PA pressure.    3. Left atrial size was mildly dilated.   4. The mitral valve  is normal in structure. No evidence of mitral valve  regurgitation. No evidence of mitral stenosis.   5. The aortic valve is tricuspid. Aortic valve regurgitation is trivial.  No aortic stenosis is present.   6. The inferior vena cava is normal in size with greater than 50%  respiratory variability, suggesting right atrial pressure of 3 mmHg.   7. The patient was in atrial fibrillation.    EKG-  ***  CHA2DS2-VASc Score =    The patient's score is based upon:   {Click here to calculate score.  REFRESH note before signing. :1}    Chads 4 with HTN   Assessment and Plan: 1. Persistent  Afib / flutter Off Tikosyn  due to CKD and qt issues.  Patient is currently in ***.   2. Secondary hypercoagulable state due to atrial fibrillation H/o upper GI bleed Resolved  Avoid antiinflammatories Continue Pradaxa  150 mg BID.   3. HTN Stable today.   4. TIA/aneurysm S/P embolization 05/2021.    Follow up ***.    Fairy Heinrich, [facility] [address] [place], [place] [address] [phone]

## 2024-01-08 NOTE — Progress Notes (Signed)
 Patient ID: Rose Ball, female   DOB: [birth year], 78 y.o.   MRN: [id number]     Primary Care Physician: Patient, No Pcp Per Referring Physician: Dr. Kelsie (inactive) Neurology: Dr. Gregg Richerd HILDE Ball is a 78 y.o. female with a h/o afib, s/p convergent procedure 2013, on tikosyn . She is in the clinic for f/u. She had  GI bleed, last October, was taking naprosyn  with pradaxa  for low back pain. Had presenting HGB of 6.9, received 3 units of blood. Was found to have a gastric ulcer, endoscopy and colonoscopy was done in the hospital. She was seen f/u by GI but has not had any further f/u of her blood count as she still does not have a PCP.  This in the past has been  stressed to the pt importance of getting established with one. She states that she is working on it.   She denies  feeling   weakness or shortness of breath with exertion as she did with the anemia.. She has remained in SR during this time. No further bleeding issues.   F/u in afib clinic, 05/19/19. Pt is here for Tikosyn  surveillance. Reports no afib. Feels well. Still has not established with a PCP.   F/u in afib clinic, 12/24/19. Does not report any afib. Compliant with Tikosyn  and Pradaxa  with a CHA2DS2VASc score of 4. Fasting blood work to f/u on refill of Lipitor as she has not established with a PCP. BP is elevated today. States she took BB just minutes ago in her car. No concerns today.   F/u in afib clinic, 08/03/20. No afib to report. On last visit, her Tikosyn  dose was reduced for decline in kidney function. She was referred to nephrology and I think her function stabilized. Will check again today. In conversation, pt states that she has been opening the capsule of 500 mcg  tikosyn  and spitting the capsules into 2 doses. Informed pt that this is dangerous and this is not recommended. She states she did not want to waste money . I will re send the 250 mcg capsule and repeat EKG when back on correct dose in 2-3  days.Ekg shows atrial flutter today at 97 bpm.    F/u in afib clinic, 04/25/21 for afib.I see in Epic  where she was sent to the ER 01/25/21 when she was talking to her therapist and could not get her words out.  MRI was done, dx with TIA and an aneurysm was found . She was discharged to f/u with Neurology and interventional radiologist. She did not go to the radiologist. When she saw Neurology, he urged her to make another appointment. I can see where his office tried twice to call her to schedule. I asked her today if she did , she replied she didn't and did not seem overly enthused to schedule the appointment. I urged her to do this again today based on the concerns of Dr. Gregg.. I had also referred her to nephrology in the past  for her CKD and she no longer sees them.  She has no PCP to monitor her BP/kidney function/other primary issues. She has been asked numerous times to establish with a PCP. She is in SR today but states that she will be often in afib. Tikosyn  had to be stopped for elevated creatinine and prolonged  qt interval. She remains on pradaxa , renal function calculated to crcl/min and she remains on the appropriate dose. BP is very elevated today at 180/86. She  does not monitor  at home.   F/u in afib clinic, 07/23/22. She is currently in SR. She cannot recall last episode of Afib. She is doing well overall but admits to not having taken either amlodipine  or metoprolol  for months. Her BP is very elevated today. She feels drained and a little lightheaded but denies blurry vision, headache, chest pain. She has been compliant with Pradaxa  150 mg BID. No bleeding concerns. 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   F/u in Afib clinic, 02/05/23. She is currently in atrial flutter. She went to ED on 10/26 for leg pain, feeling confused, ring in field of vision, and was concerned about recurrence of her cerebral aneurysm. She was found to be in atrial flutter.  CTA imaging was reassuring. No bleeding issues on Pradaxa  150 mg BID. She feels tired when out of rhythm.   Follow up Afib clinic, 01/08/24. Patient is currently in atrial flutter. She was seen last November as noted above and subsequently canceled the cardioversion which was scheduled. Patient notes to overall not have cardiac awareness. No bleeding issues on Pradaxa  150 mg BID but she has run out of anticoagulant.   Today, she denies symptoms of palpitations, chest pain,  orthopnea, PND, lower extremity edema, dizziness, presyncope, syncope, or neurologic sequela.  The patient is tolerating medications without difficulties and is otherwise without complaint today.   Past Medical History:  Diagnosis Date   ADHD (attention deficit hyperactivity disorder)    Anxiety    Arthritis    Attention deficit    on Vyvanse    Chronic anticoagulation    Depression    Dyslipidemia    GI bleed 2022   Hypertension    Hypokalemia    Morbid obesity (HCC)    Palpitations    Persistent atrial fibrillation (HCC) 05/27/14 Chadsvasc score of 4   s/p cardioversion Sept 2012; failed   Pneumonia    Stroke [facility])    TIA (transient ischemic attack)    12/2020   Past Surgical History:  Procedure Laterality Date   BIOPSY  01/02/2018   Procedure: BIOPSY;  Surgeon: Shila Gustav GAILS, MD;  Location: [facility] ENDOSCOPY;  Service: Endoscopy;;   BREAST BIOPSY     CARDIOVERSION  Sept 2012   failed   CARDIOVERSION N/A 09/08/2012   Procedure: CARDIOVERSION;  Surgeon: Vina GAILS Gull, MD;  Location: [facility] ENDOSCOPY;  Service: Cardiovascular;  Laterality: N/A;   CARDIOVERSION N/A 02/15/2013   Procedure: CARDIOVERSION-BEDSIDE;  Surgeon: Debby Gallop, MD;  Location: [facility] OR;  Service: Cardiovascular;  Laterality: N/A;   CARDIOVERSION N/A 02/05/2011   Procedure: CARDIOVERSION;  Surgeon: Danelle LELON Birmingham, MD;  Location: [facility] CATH LAB;  Service: Cardiovascular;  Laterality: N/A;   COLONOSCOPY N/A 01/03/2018   Procedure: COLONOSCOPY;   Surgeon: Shila Gustav GAILS, MD;  Location: [facility] ENDOSCOPY;  Service: Endoscopy;  Laterality: N/A;   Convergent procedure  11/13/11   afib ablation at [facility]   EP IMPLANTABLE DEVICE N/A 02/09/2015   Procedure: Loop Recorder Removal;  Surgeon: Lynwood Rakers, MD;  Location: [facility] INVASIVE CV LAB;  Service: Cardiovascular;  Laterality: N/A;   ESOPHAGOGASTRODUODENOSCOPY (EGD) WITH PROPOFOL  N/A 01/02/2018   Procedure: ESOPHAGOGASTRODUODENOSCOPY (EGD) WITH PROPOFOL ;  Surgeon: Shila Gustav GAILS, MD;  Location: [facility] ENDOSCOPY;  Service: Endoscopy;  Laterality: N/A;   IR 3D INDEPENDENT WKST  06/21/2021   IR ANGIO INTRA EXTRACRAN SEL COM CAROTID INNOMINATE UNI L MOD SED  06/21/2021   IR ANGIO INTRA EXTRACRAN SEL INTERNAL CAROTID UNI R  MOD SED  06/21/2021   IR ANGIO VERTEBRAL SEL SUBCLAVIAN INNOMINATE BILAT MOD SED  06/21/2021   IR ANGIOGRAM FOLLOW UP STUDY  06/21/2021   IR CT HEAD LTD  06/21/2021   IR NEURO EACH ADD'L AFTER BASIC UNI RIGHT (MS)  06/21/2021   IR TRANSCATH/EMBOLIZ  06/21/2021   IR US  GUIDE VASC ACCESS RIGHT  06/21/2021   loop recorder insertion Left 09/2011   Loop recorder removal  02/09/15   MDT Reveal XT loop recorder removed by Dr Kelsie   POLYPECTOMY  01/03/2018   Procedure: POLYPECTOMY;  Surgeon: Shila Gustav GAILS, MD;  Location: [facility] ENDOSCOPY;  Service: Endoscopy;;   RADIOLOGY WITH ANESTHESIA N/A 06/21/2021   Procedure: EMBOLIZATION;  Surgeon: de Macedo Rodrigues, Katyucia, MD;  Location: [facility] OR;  Service: Radiology;  Laterality: N/A;   TEE WITHOUT CARDIOVERSION N/A 09/08/2012   Procedure: TRANSESOPHAGEAL ECHOCARDIOGRAM (TEE);  Surgeon: Vina GAILS Gull, MD;  Location: [facility] ENDOSCOPY;  Service: Cardiovascular;  Laterality: N/A;   TONSILLECTOMY     TONSILLECTOMY     US  ECHOCARDIOGRAPHY  Aug 2012   Normal systolic function with EF 55% and slight LAE at 4.4cm    Current Outpatient Medications  Medication Sig Dispense Refill   amLODipine  (NORVASC ) 5 MG tablet Take 1 tablet (5 mg total) by mouth daily. 90  tablet 2   amphetamine-dextroamphetamine (ADDERALL) 15 MG tablet Take 15 mg by mouth daily as needed (for additional focus).      busPIRone (BUSPAR) 30 MG tablet Take 30 mg by mouth 2 (two) times daily.     escitalopram  (LEXAPRO ) 20 MG tablet Take 20 mg by mouth Daily. Lexapro      Melatonin 10 MG TABS Take 10 mg by mouth at bedtime.     Multiple Vitamins-Minerals (MULTIVITAMIN WITH MINERALS) tablet Take 1 tablet by mouth daily.     rOPINIRole  (REQUIP ) 1 MG tablet Take 1 mg by mouth at bedtime.     VYVANSE  70 MG capsule Take 70 mg by mouth Daily.      dabigatran  (PRADAXA ) 150 MG CAPS capsule Take 1 capsule (150 mg total) by mouth 2 (two) times daily. 180 capsule 3   metoprolol  tartrate (LOPRESSOR ) 50 MG tablet Take 1 tablet (50 mg total) by mouth 2 (two) times daily. Needs appointment for refills 180 tablet 3   No current facility-administered medications for this encounter.    Allergies  Allergen Reactions   Amiodarone Other (See Comments)    REACTION: dyspnea    ROS- All systems are reviewed and negative except as per the HPI above  Physical Exam: Vitals:   01/08/24 1411  BP: 128/88  Pulse: 87  Weight: 87.7 kg  Height: 5' 9 (1.753 m)    GEN- The patient is well appearing, alert and oriented x 3 today.   Neck - no JVD or carotid bruit noted Lungs- Clear to ausculation bilaterally, normal work of breathing Heart- Regular rate (atrial flutter) no murmurs, rubs or gallops, PMI not laterally displaced Extremities- no clubbing, cyanosis, or edema Skin - no rash or ecchymosis noted   Echo 01/25/21:  1. Left ventricular ejection fraction, by estimation, is 55 to 60%. The  left ventricle has normal function. The left ventricle has no regional  wall motion abnormalities. There is mild left ventricular hypertrophy.  Left ventricular diastolic parameters  are indeterminate.   2. Right ventricular systolic function is normal. The right ventricular  size is normal. Tricuspid  regurgitation signal is inadequate for assessing  PA pressure.   3.  Left atrial size was mildly dilated.   4. The mitral valve is normal in structure. No evidence of mitral valve  regurgitation. No evidence of mitral stenosis.   5. The aortic valve is tricuspid. Aortic valve regurgitation is trivial.  No aortic stenosis is present.   6. The inferior vena cava is normal in size with greater than 50%  respiratory variability, suggesting right atrial pressure of 3 mmHg.   7. The patient was in atrial fibrillation.    EKG-  Vent. rate 87 BPM PR interval 248 ms QRS duration 80 ms QT/QTcB 354/425 ms P-R-T axes 90 30 47 Sinus rhythm with 1st degree A-V block Nonspecific T wave abnormality Abnormal ECG When compared with ECG of 05-Feb-2023 14:22,  CHA2DS2-VASc Score = 4  The patient's score is based upon: CHF History: 0 HTN History: 1 Diabetes History: 0 Stroke History: 0 Vascular Disease History: 0 Age Score: 2 Gender Score: 1       Assessment and Plan: 1. Persistent  Afib / flutter Off Tikosyn  due to CKD and qt issues.  Patient appears to be in atrial flutter. Patient does not want to proceed with cardioversion or intervention at this time due to not having cardiac awareness of arrhythmia.    2. Secondary hypercoagulable state due to atrial fibrillation H/o upper GI bleed Resolved  Avoid antiinflammatories Continue Pradaxa  150 mg BID.   3. HTN Stable today.   4. TIA/aneurysm S/P embolization 05/2021.     Follow up 1 year Afib clinic.    Fairy Heinrich, [facility] [address] [place], [place] [address] [phone]

## 2024-01-20 ENCOUNTER — Ambulatory Visit (HOSPITAL_COMMUNITY): Admitting: Internal Medicine

## 2024-02-07 ENCOUNTER — Other Ambulatory Visit (HOSPITAL_COMMUNITY): Payer: Self-pay | Admitting: Internal Medicine
